# Patient Record
Sex: Male | Born: 1965
Health system: Southern US, Community
[De-identification: ages and names within clinical notes are randomized; demographics above are authoritative.]

## PROBLEM LIST (undated history)

## (undated) DIAGNOSIS — R600 Localized edema: Secondary | ICD-10-CM

## (undated) DIAGNOSIS — I340 Nonrheumatic mitral (valve) insufficiency: Secondary | ICD-10-CM

## (undated) DIAGNOSIS — F419 Anxiety disorder, unspecified: Secondary | ICD-10-CM

## (undated) DIAGNOSIS — I4892 Unspecified atrial flutter: Secondary | ICD-10-CM

## (undated) DIAGNOSIS — L409 Psoriasis, unspecified: Secondary | ICD-10-CM

## (undated) DIAGNOSIS — I10 Essential (primary) hypertension: Secondary | ICD-10-CM

## (undated) DIAGNOSIS — I4891 Unspecified atrial fibrillation: Principal | ICD-10-CM

## (undated) DIAGNOSIS — I2510 Atherosclerotic heart disease of native coronary artery without angina pectoris: Secondary | ICD-10-CM

## (undated) DIAGNOSIS — Z8679 Personal history of other diseases of the circulatory system: Secondary | ICD-10-CM

## (undated) DIAGNOSIS — Z9889 Other specified postprocedural states: Secondary | ICD-10-CM

## (undated) HISTORY — DX: Atherosclerotic heart disease of native coronary artery without angina pectoris: I25.10

## (undated) HISTORY — DX: Nonrheumatic mitral (valve) insufficiency: I34.0

## (undated) HISTORY — PX: HERNIA REPAIR: SHX51

## (undated) HISTORY — DX: Localized edema: R60.0

## (undated) HISTORY — PX: CARPAL TUNNEL RELEASE: SHX101

---

## 1998-01-10 ENCOUNTER — Ambulatory Visit (HOSPITAL_COMMUNITY): Admission: RE | Admit: 1998-01-10 | Discharge: 1998-01-10 | Payer: Self-pay | Admitting: Family Medicine

## 1998-03-11 ENCOUNTER — Ambulatory Visit (HOSPITAL_COMMUNITY): Admission: RE | Admit: 1998-03-11 | Discharge: 1998-03-11 | Payer: Self-pay | Admitting: Orthopedic Surgery

## 1999-05-26 ENCOUNTER — Encounter: Admission: RE | Admit: 1999-05-26 | Discharge: 1999-05-26 | Payer: Self-pay | Admitting: Rheumatology

## 1999-05-26 ENCOUNTER — Encounter: Payer: Self-pay | Admitting: Rheumatology

## 2000-11-10 ENCOUNTER — Ambulatory Visit (HOSPITAL_BASED_OUTPATIENT_CLINIC_OR_DEPARTMENT_OTHER): Admission: RE | Admit: 2000-11-10 | Discharge: 2000-11-10 | Payer: Self-pay | Admitting: General Surgery

## 2001-04-16 ENCOUNTER — Ambulatory Visit (HOSPITAL_COMMUNITY): Admission: RE | Admit: 2001-04-16 | Discharge: 2001-04-16 | Payer: Self-pay | Admitting: Family Medicine

## 2001-04-16 ENCOUNTER — Encounter: Payer: Self-pay | Admitting: Family Medicine

## 2003-05-21 ENCOUNTER — Ambulatory Visit (HOSPITAL_COMMUNITY): Admission: RE | Admit: 2003-05-21 | Discharge: 2003-05-21 | Payer: Self-pay | Admitting: Family Medicine

## 2005-01-21 ENCOUNTER — Emergency Department (HOSPITAL_COMMUNITY): Admission: EM | Admit: 2005-01-21 | Discharge: 2005-01-22 | Payer: Self-pay | Admitting: *Deleted

## 2007-05-09 ENCOUNTER — Encounter: Admission: RE | Admit: 2007-05-09 | Discharge: 2007-05-09 | Payer: Self-pay | Admitting: Orthopedic Surgery

## 2010-10-16 NOTE — Op Note (Signed)
[place]  Patient:    Evan [name], Evan [name]                       MRN: [id number] Proc. Date: 11/10/00 Adm. Date:  [id number] Attending:  Cherylynn Ridges                           Operative Report  PREOPERATIVE DIAGNOSIS:  Right inguinal hernia.  POSTOPERATIVE DIAGNOSIS:  Right inguinal hernia.  PROCEDURE:  Right inguinal herniorrhaphy with mesh.  SURGEON:  Dr. Lindie Spruce.  ASSISTANT:  None.  ANESTHESIA:  General with a laryngeal airway.  ESTIMATED BLOOD LOSS:  Less than 20 cc.  COMPLICATIONS: None.  CONDITION:  Stable.  SPECIMENS:  None.  INDICATION FOR OPERATION:  The patient is an otherwise healthy 45 year old male with a right inguinal hernia who comes in now for repair.  FINDINGS:  The patient had a small indirect hernia which is easy to repair and also some mild weakness of the floor.  OPERATION:  The patient was taken to the operating room, placed on table in supine position.  After an adequate, general laryngeal airway anesthetic was administered he was prepped and draped in the usual sterile manner exposing the right groin.  A 10 blade was used to make a right transverse curvilinear incision at the level of the superficial ring.  It was taken down through the subcutaneous down to the external oblique fascia and through Scarpas fascia.  A small subcutaneous vein was ligated with 3-0 Vicryl.  We opened the external oblique fascia along its fibers down through the superficial ring.  The ilioinguinal nerve could be seen coursing onto the anterior medial aspect of the spermatic cord.  It was mobilized along with the cord at the pubic tubercle where it was subsequently controlled with a Penrose drain.  We placed the spermatic cord up on a ______ bench so we were able to isolate out the indirect sac from end-to-end anterior medial aspect of the cord.  We doubly ligated at the base using an 0 Ethibond suture x 2.  Once this was done we  reinforced the floor with an overpiece of Marlex mesh measuring 4 x 2 cm in size attaching it to the conjoint muscle tendon anteromedially and the reflective portion of the inguinal ligament inferolaterally.  Once this was done irrigation with antibiotic solution was placed into the canal.  We reapproximated the external oblique fascia using a running 3-0 Vicryl suture.  Once this was done, the subcutaneous, Scarpas fascia, was reapproximated using 3 interrupted stitches of 3-0 Vicryl.  Then the skin was closed using a running subcuticular stitch of 4-0 Prolene, a pullout stitch that can be cut and then pulled out from either end.  Steri-Strips were applied to the wound.  All needle counts, sponge counts and instrument counts were correct.  A sterile dressing was applied. DD:  11/10/00 TD:  11/10/00 Job: [id number]

## 2012-04-07 ENCOUNTER — Other Ambulatory Visit: Payer: Self-pay | Admitting: Occupational Medicine

## 2012-04-07 ENCOUNTER — Ambulatory Visit: Payer: Self-pay

## 2012-04-07 DIAGNOSIS — M79641 Pain in right hand: Secondary | ICD-10-CM

## 2012-08-04 ENCOUNTER — Emergency Department (HOSPITAL_COMMUNITY)
Admission: EM | Admit: 2012-08-04 | Discharge: 2012-08-04 | Disposition: A | Discharge status: Home / Self Care | Payer: 59 | Attending: Emergency Medicine | Admitting: Emergency Medicine

## 2012-08-04 ENCOUNTER — Encounter (HOSPITAL_COMMUNITY): Payer: Self-pay | Admitting: *Deleted

## 2012-08-04 DIAGNOSIS — W268XXA Contact with other sharp object(s), not elsewhere classified, initial encounter: Secondary | ICD-10-CM | POA: Insufficient documentation

## 2012-08-04 DIAGNOSIS — Y929 Unspecified place or not applicable: Secondary | ICD-10-CM | POA: Insufficient documentation

## 2012-08-04 DIAGNOSIS — S91332A Puncture wound without foreign body, left foot, initial encounter: Secondary | ICD-10-CM

## 2012-08-04 DIAGNOSIS — Z23 Encounter for immunization: Secondary | ICD-10-CM | POA: Insufficient documentation

## 2012-08-04 DIAGNOSIS — S91309A Unspecified open wound, unspecified foot, initial encounter: Secondary | ICD-10-CM | POA: Insufficient documentation

## 2012-08-04 DIAGNOSIS — I10 Essential (primary) hypertension: Secondary | ICD-10-CM | POA: Insufficient documentation

## 2012-08-04 DIAGNOSIS — Y939 Activity, unspecified: Secondary | ICD-10-CM | POA: Insufficient documentation

## 2012-08-04 DIAGNOSIS — Z79899 Other long term (current) drug therapy: Secondary | ICD-10-CM | POA: Insufficient documentation

## 2012-08-04 HISTORY — DX: Essential (primary) hypertension: I10

## 2012-08-04 MED ORDER — TETANUS-DIPHTH-ACELL PERTUSSIS 5-2.5-18.5 LF-MCG/0.5 IM SUSP
0.5000 mL | Freq: Once | INTRAMUSCULAR | Status: AC
  Administered 2012-08-04: 0.5 mL via INTRAMUSCULAR
  Filled 2012-08-04: qty 0.5

## 2012-08-04 MED ORDER — CIPROFLOXACIN HCL 500 MG PO TABS: 500.0000 mg | ORAL_TABLET | Freq: Two times a day (BID) | ORAL | Status: DC

## 2012-08-04 NOTE — ED Provider Notes (Signed)
Medical screening examination/treatment/procedure(s) were performed by non-physician practitioner and as supervising physician I was immediately available for consultation/collaboration.   Stephen Rancour, MD 08/04/12 2208 

## 2012-08-04 NOTE — ED Provider Notes (Signed)
History    This chart was scribed for non-physician practitioner working with Glynn Octave, MD by Frederik Pear, ED Scribe. This patient was seen in room TR09C/TR09C and the patient's care was started at 2024.   CSN: [id number]  Arrival date & time 08/04/12  1926   First MD Initiated Contact with Patient 08/04/12 2024      Chief Complaint  Patient presents with  . Puncture Wound    (Consider location/radiation/quality/duration/timing/severity/associated sxs/prior treatment) The history is provided by the patient. No language interpreter was used.    Evan [name] is a 47 y.o. male who presents to the Emergency Department complaining of sudden onset, constant, moderate puncture wound to the bottom of his left foot after he stepped on a small rusty, paint-coated nail while wearing only his socks that was left in his carpet by maintenance. In ED, the bleeding is controlled. He reports that he pulled the nail out prior to arrival.  He also reports that the nail came out in its entirety and did not break.  He has a h/o of hypertension and anxiety for which he takes Benicar and Xanax. He reports that his last tetanus shot was 6-7 years ago. He denies any allergies to medications.    Past Medical History  Diagnosis Date  . Hypertension     History reviewed. No pertinent past surgical history.  No family history on file.  History  Substance Use Topics  . Smoking status: Never Smoker   . Smokeless tobacco: Not on file  . Alcohol Use: Yes      Review of Systems  Constitutional: Negative for fever, diaphoresis, appetite change, fatigue and unexpected weight change.  HENT: Negative for mouth sores and neck stiffness.   Eyes: Negative for visual disturbance.  Respiratory: Negative for cough, chest tightness, shortness of breath and wheezing.   Cardiovascular: Negative for chest pain.  Gastrointestinal: Negative for nausea, vomiting, abdominal pain, diarrhea and constipation.   Endocrine: Negative for polydipsia, polyphagia and polyuria.  Genitourinary: Negative for dysuria, urgency, frequency and hematuria.  Musculoskeletal: Negative for back pain.  Skin: Positive for wound. Negative for rash.  Allergic/Immunologic: Negative for immunocompromised state.  Neurological: Negative for syncope, light-headedness and headaches.  Hematological: Does not bruise/bleed easily.  Psychiatric/Behavioral: Negative for sleep disturbance. The patient is not nervous/anxious.   All other systems reviewed and are negative.    Allergies  Review of patient's allergies indicates no known allergies.  Home Medications   Current Outpatient Rx  Name  Route  Sig  Dispense  Refill  . ALPRAZolam (XANAX) 0.5 MG tablet   Oral   Take 0.5 mg by mouth 2 (two) times daily as needed for anxiety.         . Multiple Vitamin (MULTIVITAMIN WITH MINERALS) TABS   Oral   Take 1 tablet by mouth daily.         Marland Kitchen olmesartan (BENICAR) 20 MG tablet   Oral   Take 20 mg by mouth every morning.         . ciprofloxacin (CIPRO) 500 MG tablet   Oral   Take 1 tablet (500 mg total) by mouth every 12 (twelve) hours.   20 tablet   0     BP 151/97  Pulse 87  Temp(Src) 98.3 F (36.8 C) (Oral)  Resp 18  SpO2 96%  Physical Exam  Nursing note and vitals reviewed. Constitutional: He appears well-developed and well-nourished. No distress.  HENT:  Head: Normocephalic and atraumatic.  Mouth/Throat: Oropharynx  is clear and moist. No oropharyngeal exudate.  Eyes: Conjunctivae are normal. No scleral icterus.  Neck: Normal range of motion. Neck supple.  Cardiovascular: Normal rate, regular rhythm and intact distal pulses.   Pulmonary/Chest: Effort normal and breath sounds normal. No respiratory distress. He has no wheezes.  Abdominal: Soft. Bowel sounds are normal. He exhibits no mass. There is no tenderness. There is no rebound and no guarding.  Musculoskeletal: Normal range of motion. He  exhibits no edema.  Neurological: He is alert.  Speech is clear and goal oriented Moves extremities without ataxia  Skin: Skin is warm and dry. Ecchymosis and laceration noted. He is not diaphoretic.  There is a puncture wound to the bottom of the left foot with ecchymosis. Hemostasis achieved No palpable foreign body  Psychiatric: He has a normal mood and affect.    ED Course  Procedures (including critical care time)  DIAGNOSTIC STUDIES: Oxygen Saturation is 96% on room air, adequate by my interpretation.    COORDINATION OF CARE:  20:25- Discussed planned course of treatment with the patient, including a TDaP and an antibiotic, who is agreeable at this time.  20:36- Medication Orders- TDaP (boostrix) injection 0.5 mL- once  Labs Reviewed - No data to display No results found.   1. Puncture wound of foot, left, initial encounter       MDM  Evan [name] presents with a laceration to the bottom of his foot from rusty nail. Patient puncture wound explored and no foreign bodies palpated, wound is too small for suture. Patient tetanus updated and will be discharged home on antibiotics. Wound care discussed. I have also discussed reasons to return immediately to the ER.  Patient expresses understanding and agrees with plan.   1. Medications: cipro, usual home medications  2. Treatment: rest, drink plenty of fluids, take medication as prescribed  3. Follow Up: Please followup with your primary doctor for discussion of your diagnoses and further evaluation after today's visit; if you do not have a primary care doctor use the resource guide provided to find one;    I personally performed the services described in this documentation, which was scribed in my presence. The recorded information has been reviewed and is accurate.       Dahlia Client Muthersbaugh, PA-C 08/04/12 2110

## 2012-08-04 NOTE — ED Notes (Signed)
The pt stepped on a rusty nail just pta.  The nail was left in huis carpet by maintenance.  Puncture wound to the bottom of his left foot.  The nail went thorough his sock.  Needs a tetnaus

## 2014-01-09 ENCOUNTER — Encounter (HOSPITAL_COMMUNITY): Payer: Self-pay | Admitting: Emergency Medicine

## 2014-01-09 ENCOUNTER — Emergency Department (HOSPITAL_COMMUNITY)
Admission: EM | Admit: 2014-01-09 | Discharge: 2014-01-09 | Disposition: A | Discharge status: Home / Self Care | Payer: 59 | Attending: Emergency Medicine | Admitting: Emergency Medicine

## 2014-01-09 ENCOUNTER — Emergency Department (HOSPITAL_COMMUNITY): Payer: 59

## 2014-01-09 DIAGNOSIS — F411 Generalized anxiety disorder: Secondary | ICD-10-CM | POA: Insufficient documentation

## 2014-01-09 DIAGNOSIS — Z87828 Personal history of other (healed) physical injury and trauma: Secondary | ICD-10-CM | POA: Insufficient documentation

## 2014-01-09 DIAGNOSIS — Z79899 Other long term (current) drug therapy: Secondary | ICD-10-CM | POA: Insufficient documentation

## 2014-01-09 DIAGNOSIS — Z872 Personal history of diseases of the skin and subcutaneous tissue: Secondary | ICD-10-CM | POA: Diagnosis not present

## 2014-01-09 DIAGNOSIS — I10 Essential (primary) hypertension: Secondary | ICD-10-CM | POA: Diagnosis not present

## 2014-01-09 DIAGNOSIS — Z7982 Long term (current) use of aspirin: Secondary | ICD-10-CM | POA: Diagnosis not present

## 2014-01-09 DIAGNOSIS — Z87891 Personal history of nicotine dependence: Secondary | ICD-10-CM | POA: Diagnosis not present

## 2014-01-09 DIAGNOSIS — I4891 Unspecified atrial fibrillation: Secondary | ICD-10-CM | POA: Diagnosis not present

## 2014-01-09 DIAGNOSIS — R079 Chest pain, unspecified: Secondary | ICD-10-CM | POA: Insufficient documentation

## 2014-01-09 HISTORY — DX: Psoriasis, unspecified: L40.9

## 2014-01-09 HISTORY — DX: Anxiety disorder, unspecified: F41.9

## 2014-01-09 LAB — CBC
HEMATOCRIT: 41.1 % (ref 39.0–52.0)
Hemoglobin: 14 g/dL (ref 13.0–17.0)
MCH: 35.2 pg — ABNORMAL HIGH (ref 26.0–34.0)
MCHC: 34.1 g/dL (ref 30.0–36.0)
MCV: 103.3 fL — ABNORMAL HIGH (ref 78.0–100.0)
PLATELETS: 204 10*3/uL (ref 150–400)
RBC: 3.98 MIL/uL — ABNORMAL LOW (ref 4.22–5.81)
RDW: 13.1 % (ref 11.5–15.5)
WBC: 5.5 10*3/uL (ref 4.0–10.5)

## 2014-01-09 LAB — COMPREHENSIVE METABOLIC PANEL
ALK PHOS: 83 U/L (ref 39–117)
ALT: 34 U/L (ref 0–53)
AST: 49 U/L — ABNORMAL HIGH (ref 0–37)
Albumin: 3.6 g/dL (ref 3.5–5.2)
Anion gap: 15 (ref 5–15)
BUN: 13 mg/dL (ref 6–23)
CO2: 25 mEq/L (ref 19–32)
CREATININE: 1.04 mg/dL (ref 0.50–1.35)
Calcium: 8.7 mg/dL (ref 8.4–10.5)
Chloride: 104 mEq/L (ref 96–112)
GFR calc Af Amer: >90 mL/min (ref >90)
GFR calc non Af Amer: 82 mL/min — ABNORMAL LOW (ref >90)
Glucose, Bld: 92 mg/dL (ref 70–99)
POTASSIUM: 4 meq/L (ref 3.7–5.3)
Sodium: 144 mEq/L (ref 137–147)
TOTAL PROTEIN: 7.1 g/dL (ref 6.0–8.3)
Total Bilirubin: 0.5 mg/dL (ref 0.3–1.2)

## 2014-01-09 LAB — TROPONIN I

## 2014-01-09 MED ORDER — METOPROLOL TARTRATE 12.5 MG HALF TABLET: 100.0000 mg | ORAL_TABLET | Freq: Two times a day (BID) | ORAL | Status: DC

## 2014-01-09 NOTE — Discharge Instructions (Signed)

## 2014-01-09 NOTE — ED Provider Notes (Signed)
CSN: [id number]     Arrival date & time 01/09/14  1811 History   First MD Initiated Contact with Patient 01/09/14 1838     Chief Complaint  Patient presents with  . Atrial Fibrillation      HPI Pt arrived by [facility] from [facility] with c/o afib RVR. Initially pt had a rate of 144 in afib and when they arrived to ED pt rate 76 NSR, when getting pt to room rate 42 bradycardia with PJCs and peaked T waves. Pt stated that he fell earlier today, denies LOC, stated that he tripped and landed on asphalt. Stated that he has been having some right sided CP that started after he fell that comes and goes and pain increases on palpation.   Past Medical History  Diagnosis Date  . Hypertension   . Anxiety   . Psoriasis    Past Surgical History  Procedure Laterality Date  . Hernia repair    . Carpal tunnel release     No family history on file. History  Substance Use Topics  . Smoking status: Former Smoker    Types: Cigarettes  . Smokeless tobacco: Not on file  . Alcohol Use: Yes     Comment: wine at night to help relax    Review of Systems  All other systems reviewed and are negative  Allergies  Review of patient's allergies indicates no known allergies.  Home Medications   Prior to Admission medications   Medication Sig Start Date End Date Taking? Authorizing Provider  aspirin EC 81 MG tablet Take 81 mg by mouth daily.   Yes Historical Provider, MD  citalopram (CELEXA) 20 MG tablet Take 20 mg by mouth daily. 10/08/13  Yes Historical Provider, MD  Cyanocobalamin (B-12 PO) Take 1 capsule by mouth daily.   Yes Historical Provider, MD  LORazepam (ATIVAN) 0.5 MG tablet Take 0.5 mg by mouth every 8 (eight) hours as needed for anxiety.   Yes Historical Provider, MD  Multiple Vitamin (MULTIVITAMIN WITH MINERALS) TABS Take 1 tablet by mouth daily.   Yes Historical Provider, MD  olmesartan (BENICAR) 40 MG tablet Take 40 mg by mouth daily.   Yes Historical Provider, MD   metoprolol (LOPRESSOR) 12.5 mg TABS tablet Take 4 tablets (100 mg total) by mouth 2 (two) times daily. 01/09/14   Dot Lanes, MD   BP 118/80  Pulse 42  Temp(Src) 98.2 F (36.8 C) (Oral)  Resp 16  Ht 6\' 2"  (1.88 m)  Wt 205 lb (92.987 kg)  BMI 26.31 kg/m2  SpO2 99% Physical Exam Physical Exam  Nursing note and vitals reviewed. Constitutional: He is oriented to person, place, and time. He appears well-developed and well-nourished. No distress.  HENT:  Head: Normocephalic and atraumatic.  Eyes: Pupils are equal, round, and reactive to light.  Neck: Normal range of motion.  Cardiovascular: Normal rate and intact distal pulses.   no significant murmurs or gallops. Pulmonary/Chest: No respiratory distress.  Breath sounds equal  Abdominal: Normal appearance. He exhibits no distension.  Musculoskeletal: Normal range of motion.  Neurological: He is alert and oriented to person, place, and time. No cranial nerve deficit.  Skin: Skin is warm and dry. No rash noted.  Psychiatric: He has a normal mood and affect. His behavior is normal.   ED Course  Procedures (including critical care time) Labs Review Labs Reviewed  CBC - Abnormal; Notable for the following:    RBC 3.98 (*)    MCV 103.3 (*)  MCH 35.2 (*)    All other components within normal limits  COMPREHENSIVE METABOLIC PANEL - Abnormal; Notable for the following:    AST 49 (*)    GFR calc non Af Amer 82 (*)    All other components within normal limits  TROPONIN I    Imaging Review Dg Chest 2 View  01/09/2014   CLINICAL DATA:  Atrial fibrillation.  Right chest pain.  EXAM: CHEST  2 VIEW  COMPARISON:  01/22/2005  FINDINGS: The heart size and mediastinal contours are within normal limits. Both lungs are clear. The visualized skeletal structures are unremarkable.  IMPRESSION: No active cardiopulmonary disease.   Electronically Signed   By: Lajean Manes M.D.   On: 01/09/2014 20:52     EKG Interpretation   Date/Time:   Wednesday January 09 2014 18:44:19 EDT Ventricular Rate:  42 PR Interval:  167 QRS Duration: 98 QT Interval:  446 QTC Calculation: 373 R Axis:   55 Text Interpretation:  Sinus bradycardia Abnormal ekg Confirmed by Madalina Rosman   MD, Xochil Shanker ([id number]) on 01/09/2014 8:27:22 PM     Review of EKG taken at outlying center showed atrial fibrillation with rapid ventricular response rate of 144.  Patient was seen and evaluated by cardiology.  Patient stable for discharge and will followup as outpatient with directions. MDM   Final diagnoses:  Atrial fibrillation, unspecified        Dot Lanes, MD 01/09/14 2204

## 2014-01-09 NOTE — Consult Note (Addendum)
Admit date: 01/09/2014 Referring Physician  Dr. Audie Pinto Primary Physician No primary provider on file. Primary Cardiologist  n/a Reason for Consultation  AF  ID: [name] is a 48 y.o caucasian male with psoriasis, anxiety, alcohol abuse, tobacco use who presents as a direct transfer from urgent care center with AF.   HPI: Pt presented to the urgent care clinic with right sided chest pain. This morning pt had mechanical fall as he slipped on gravel. Since then he has had right sided chest pain. While at urgent care, pt was noted to be AF with RVR. No palpitations at that time. Pt has had palpitations previously, which were sudden onset, resolved with anxiolytic and rest. Pt has anxiety and takes ativan PRN. Pt denies left sided chest pain, dyspnea, nausea, vomiting. He does have chronic diarrhea. He also complaints of episodes of hot flashes which come on after he showers. Pt drinks 4 glasses of wine daily. Smokes tobacco 1-2 cigs per month.   Pt spontaneously converted to NSR while in the ED. Initial ECG here with sinus bradycardia and PACs. At the time of my evaluate, pt in NSR with HR of 75.     PMH:   Past Medical History  Diagnosis Date  . Hypertension   . Anxiety   . Psoriasis     PSH:   Past Surgical History  Procedure Laterality Date  . Hernia repair    . Carpal tunnel release     Allergies:  Review of patient's allergies indicates no known allergies. Prior to Admit Meds:   Prior to Admission medications   Medication Sig Start Date End Date Taking? Authorizing Provider  aspirin EC 81 MG tablet Take 81 mg by mouth daily.   Yes Historical Provider, MD  citalopram (CELEXA) 20 MG tablet Take 20 mg by mouth daily. 10/08/13  Yes Historical Provider, MD  Cyanocobalamin (B-12 PO) Take 1 capsule by mouth daily.   Yes Historical Provider, MD  LORazepam (ATIVAN) 0.5 MG tablet Take 0.5 mg by mouth every 8 (eight) hours as needed for anxiety.   Yes Historical Provider, MD  Multiple  Vitamin (MULTIVITAMIN WITH MINERALS) TABS Take 1 tablet by mouth daily.   Yes Historical Provider, MD  olmesartan (BENICAR) 40 MG tablet Take 40 mg by mouth daily.   Yes Historical Provider, MD  metoprolol (LOPRESSOR) 12.5 mg TABS tablet Take 4 tablets (100 mg total) by mouth 2 (two) times daily. 01/09/14   Dot Lanes, MD   Fam HX:   Brother with MI at age 64. Mom died of MI in her 93s.   Social HX:   Lives in [place]. Works for [employer]  History   Social History  . Marital Status: Married    Spouse Name: [name]    Number of Children: [name]  . Years of Education: [name]   Occupational History  . Not on file.   Social History Main Topics  . Smoking status: Former Smoker    Types: Cigarettes  . Smokeless tobacco: Not on file  . Alcohol Use: Yes     Comment: wine at night to help relax  . Drug Use: No  . Sexual Activity: Not on file   Other Topics Concern  . Not on file   Social History Narrative  . No narrative on file     ROS:  All 11 ROS were addressed and are negative except what is stated in the HPI   Physical Exam: Blood pressure 115/77, pulse  43, temperature 98.2 F (36.8 C), temperature source Oral, resp. rate 18, height 6\' 2"  (1.88 m), weight 92.987 kg (205 lb), SpO2 98.00%.   General: Well developed, well nourished, in no acute distress. Multiple tattoos noted.  Head: Eyes PERRLA, No xanthomas.   Normal cephalic and atramatic  Lungs:   Clear bilaterally to auscultation and percussion. Normal respiratory effort. No wheezes, no rales. Heart:   HRRR S1 S2 Pulses are 2+ & equal. No murmur, rubs, gallops.  No carotid bruit. No JVD.  No abdominal bruits.  Abdomen: Bowel sounds are positive, abdomen soft and non-tender without masses. No hepatosplenomegaly. Msk:  Back normal. Normal strength and tone for age. Extremities:  No clubbing, cyanosis or edema.  DP +1 Neuro: Alert and oriented X 3, non-focal, MAE x 4 GU: Deferred Rectal: Deferred Psych:  Good  affect, responds appropriately      Labs: Lab Results  Component Value Date   WBC 5.5 01/09/2014   HGB 14.0 01/09/2014   HCT 41.1 01/09/2014   MCV 103.3* 01/09/2014   PLT 204 01/09/2014     Recent Labs Lab 01/09/14 1815  NA 144  K 4.0  CL 104  CO2 25  BUN 13  CREATININE 1.04  CALCIUM 8.7  PROT 7.1  BILITOT 0.5  ALKPHOS 83  ALT 34  AST 49*  GLUCOSE 92    Recent Labs  01/09/14 1815  TROPONINI <0.30   No results found for this basename: CHOL, HDL, LDLCALC, TRIG   No results found for this basename: DDIMER     Radiology:  Dg Chest 2 View  01/09/2014   CLINICAL DATA:  Atrial fibrillation.  Right chest pain.  EXAM: CHEST  2 VIEW  COMPARISON:  01/22/2005  FINDINGS: The heart size and mediastinal contours are within normal limits. Both lungs are clear. The visualized skeletal structures are unremarkable.  IMPRESSION: No active cardiopulmonary disease.   Electronically Signed   By: Lajean Manes M.D.   On: 01/09/2014 20:52   Personally viewed.  EKG:  AF at urgent care. Sinus brady here. Tele with NSR with HR of 75.   ASSESSMENT - pAF, now in NSR - Musculoskeletal right sided chest pain - Alcoholism  PLAN:   - start on lopressor 12.5mg  po BID. His HR and BP should tolerate - continue home benicar, celexa, ativan, ASA 81mg  - we will schedule him a follow up with cardiology outpatient.  - pt feels well and agrees with discharge home from the emergency department. I have advised to return to ED for any concerning or new symptoms.   - abstain from alcohol use.   Thank you for this consult.   Orson Gear, MD  01/09/2014  10:59 PM

## 2014-01-09 NOTE — ED Notes (Signed)
Admission MD at bedside.  

## 2014-01-09 NOTE — ED Notes (Signed)
Pt arrived by [facility] from [facility] with c/o afib RVR. Initially pt had a rate of 144 in afib and when they arrived to ED pt rate 76 NSR, when getting pt to room rate 42 bradycardia with PJCs and peaked T waves. Pt stated that he fell earlier today, denies LOC, stated that he tripped and landed on asphalt. Stated that he has been having some right sided CP that started after he fell that comes and goes and pain increases on palpation.

## 2014-01-10 NOTE — Progress Notes (Signed)
CM received a phone call from [facility], [facility] pharmacist, and she requested clarification on the patient discharge prescription. This CM spoke with Lorre Munroe PA and he accessed the ED consult note from cardiology and stated that per the cardiology note the patient was to be started on Lopressor 12.5mg  po BID. This CM called Kelly at [phone] and clarified the medication order per the cardiology note. Claiborne Billings read back the clarification. No other questions or concerns. Edwyna Shell, RN, BSN, Case Manager 01/10/2014 11:34 AM

## 2014-01-24 ENCOUNTER — Encounter: Payer: Self-pay | Admitting: *Deleted

## 2014-01-24 ENCOUNTER — Other Ambulatory Visit: Payer: Self-pay | Admitting: *Deleted

## 2014-01-24 ENCOUNTER — Ambulatory Visit (INDEPENDENT_AMBULATORY_CARE_PROVIDER_SITE_OTHER): Payer: 59 | Admitting: Cardiology

## 2014-01-24 VITALS — BP 116/74 | HR 65 | Ht 74.0 in | Wt 203.0 lb

## 2014-01-24 DIAGNOSIS — F1010 Alcohol abuse, uncomplicated: Secondary | ICD-10-CM

## 2014-01-24 DIAGNOSIS — R0609 Other forms of dyspnea: Secondary | ICD-10-CM

## 2014-01-24 DIAGNOSIS — I10 Essential (primary) hypertension: Secondary | ICD-10-CM

## 2014-01-24 DIAGNOSIS — R0989 Other specified symptoms and signs involving the circulatory and respiratory systems: Secondary | ICD-10-CM

## 2014-01-24 DIAGNOSIS — R0600 Dyspnea, unspecified: Secondary | ICD-10-CM

## 2014-01-24 DIAGNOSIS — Z79899 Other long term (current) drug therapy: Secondary | ICD-10-CM

## 2014-01-24 DIAGNOSIS — I4891 Unspecified atrial fibrillation: Secondary | ICD-10-CM

## 2014-01-24 LAB — TSH: TSH: 0.75 u[IU]/mL (ref 0.35–4.50)

## 2014-01-24 LAB — APTT: aPTT: 30.9 s (ref 23.4–32.7)

## 2014-01-24 LAB — PROTIME-INR
INR: 1 ratio (ref 0.8–1.0)
Prothrombin Time: 11.1 s (ref 9.6–13.1)

## 2014-01-24 LAB — CALCIUM: Calcium: 8.9 mg/dL (ref 8.4–10.5)

## 2014-01-24 LAB — MAGNESIUM: Magnesium: 1.9 mg/dL (ref 1.5–2.5)

## 2014-01-24 NOTE — Patient Instructions (Signed)
Your physician recommends that you continue on your current medications as directed. Please refer to the Current Medication list given to you today.  Your physician recommends that you return for lab work in: TODAY (TSH, CALCIUM, MAGNESIUM, PT/PTT)  Your physician has recommended that you wear an event monitor. Event monitors are medical devices that record the heart's electrical activity. Doctors most often [place] these monitors to diagnose arrhythmias. Arrhythmias are problems with the speed or rhythm of the heartbeat. The monitor is a small, portable device. You can wear one while you do your normal daily activities. This is usually used to diagnose what is causing palpitations/syncope (passing out).  DR Meda Coffee HAS ORDERED FOR YOU TO WEAR THIS EVENT MONITOR FOR ONE WEEK ONLY  Your physician has requested that you have an echocardiogram. Echocardiography is a painless test that uses sound waves to create images of your heart. It provides your doctor with information about the size and shape of your heart and how well your heart's chambers and valves are working. This procedure takes approximately one hour. There are no restrictions for this procedure.  Your physician has requested that you have en exercise stress myoview. For further information please visit [email or web address]. Please follow instruction sheet, as given.  Your physician recommends that you schedule a follow-up appointment in: WITH DR Meda Coffee AFTER ALL YOUR TEST ARE COMPLETED AND RESULTED

## 2014-01-24 NOTE — Progress Notes (Signed)
Patient ID: Algis Liming, male   DOB: Nov 08, 1965, 48 y.o.   MRN: [id number]     Patient Name: Evan [name] Date of Encounter: 01/24/2014  Primary Care Provider:  No primary provider on file. Primary Cardiologist:  Dorothy Spark   Problem List   Past Medical History  Diagnosis Date  . Hypertension   . Anxiety   . Psoriasis    Past Surgical History  Procedure Laterality Date  . Hernia repair Right     with mesh  . Carpal tunnel release      Allergies  No Known Allergies  HPI  Evan [name] is a 48 y.o caucasian male with psoriasis, anxiety, alcohol abuse, tobacco use who presented as a direct transfer from urgent care center with AF.  Pt presented to the urgent care clinic with right sided chest pain. That morning pt had mechanical fall as he slipped on gravel. Since then he has had right sided chest pain. While at urgent care, pt was noted to be AF with RVR. No palpitations at that time. Pt has had palpitations previously, which were sudden onset, resolved with anxiolytic and rest. Pt has anxiety and takes ativan PRN. Pt denied left sided chest pain, dyspnea, nausea, vomiting. He does have chronic diarrhea. He also complaints of episodes of hot flashes which come on after he showers. Pt drinks 4 glasses of wine daily. Smokes tobacco 1-2 cigs per month.  Pt spontaneously converted to NSR while in the ED. Initial ECG here with sinus bradycardia and PACs. At the time of my evaluate, pt in NSR with HR of 75.  The patient was started on metoprolol. He has another episode of irregular rhythm with RVR yesterday. He is not sure how long it lasted. He didn't feel any palpitations or dizziness.  FH of premature CAD - brother MI at age 50.    Home Medications  Prior to Admission medications   Medication Sig Start Date End Date Taking? Authorizing Provider  aspirin EC 81 MG tablet Take 81 mg by mouth daily.   Yes Historical Provider, MD  citalopram (CELEXA) 20 MG tablet Take 20 mg  by mouth daily. 10/08/13  Yes Historical Provider, MD  Cyanocobalamin (B-12 PO) Take 500 mg by mouth daily.    Yes Historical Provider, MD  LORazepam (ATIVAN) 0.5 MG tablet Take 0.5 mg by mouth every 8 (eight) hours as needed for anxiety.   Yes Historical Provider, MD  metoprolol tartrate (LOPRESSOR) 12.5 mg TABS tablet Take 12.5 mg by mouth 2 (two) times daily. 01/09/14  Yes Dot Lanes, MD  Multiple Vitamin (MULTIVITAMIN WITH MINERALS) TABS Take 1 tablet by mouth daily.   Yes Historical Provider, MD  olmesartan (BENICAR) 40 MG tablet Take 40 mg by mouth daily.   Yes Historical Provider, MD    Family History  No family history on file.  Social History  History   Social History  . Marital Status: Married    Spouse Name: [name]    Number of Children: [name]  . Years of Education: [name]   Occupational History  . Not on file.   Social History Main Topics  . Smoking status: Former Smoker    Types: Cigarettes, Cigars  . Smokeless tobacco: Not on file  . Alcohol Use: Yes     Comment: wine at night to help relax  . Drug Use: No  . Sexual Activity: Not on file   Other Topics Concern  . Not on file   Social  History Narrative  . No narrative on file     Review of Systems, as per HPI, otherwise negative General:  No chills, fever, night sweats or weight changes.  Cardiovascular:  No chest pain, dyspnea on exertion, edema, orthopnea, palpitations, paroxysmal nocturnal dyspnea. Dermatological: No rash, lesions/masses Respiratory: No cough, dyspnea Urologic: No hematuria, dysuria Abdominal:   No nausea, vomiting, diarrhea, bright red blood per rectum, melena, or hematemesis Neurologic:  No visual changes, wkns, changes in mental status. All other systems reviewed and are otherwise negative except as noted above.  Physical Exam  Blood pressure 116/74, pulse 65, height 6\' 2"  (1.88 m), weight 203 lb (92.08 kg), SpO2 96.00%.  General: Pleasant, NAD Psych: Normal affect. Neuro: Alert  and oriented X 3. Moves all extremities spontaneously. HEENT: Normal  Neck: Supple without bruits or JVD. Lungs:  Resp regular and unlabored, CTA. Heart: RRR no s3, s4, or murmurs. Abdomen: Soft, non-tender, non-distended, BS + x 4.  Extremities: No clubbing, cyanosis or edema. DP/PT/Radials 2+ and equal bilaterally.  Labs:  No results found for this basename: CKTOTAL, CKMB, TROPONINI,  in the last 72 hours Lab Results  Component Value Date   WBC 5.5 01/09/2014   HGB 14.0 01/09/2014   HCT 41.1 01/09/2014   MCV 103.3* 01/09/2014   PLT 204 01/09/2014    No results found for this basename: DDIMER   No components found with this basename: POCBNP,     Component Value Date/Time   NA 144 01/09/2014 1815   K 4.0 01/09/2014 1815   CL 104 01/09/2014 1815   CO2 25 01/09/2014 1815   GLUCOSE 92 01/09/2014 1815   BUN 13 01/09/2014 1815   CREATININE 1.04 01/09/2014 1815   CALCIUM 8.7 01/09/2014 1815   PROT 7.1 01/09/2014 1815   ALBUMIN 3.6 01/09/2014 1815   AST 49* 01/09/2014 1815   ALT 34 01/09/2014 1815   ALKPHOS 83 01/09/2014 1815   BILITOT 0.5 01/09/2014 1815   GFRNONAA 82* 01/09/2014 1815   GFRAA >90 01/09/2014 1815   No results found for this basename: CHOL, HDL, LDLCALC, TRIG    Accessory Clinical Findings  Echocardiogram - normal  ECG - Sinus bradycardia, early repolarization, otherwise normal    Assessment & Plan  1.  New dg of paroxysmal a-fib with RVR -  We will order echo to evaluate for LVEF, LA size. Order nuclear stress test to evaluate for ischemia, if negative start Flecainide. We will also order 1 week e cardio monitor to evaluate for a-fib burden and rate.  For now continue ASA as we dont have any labs, also he states that he bleeds a lot when he gets injured. Chcek CBC, PT, INR.  2. Alcohol abuse - advised to cut down or ideally stop as it is possible a-fib trigger. He refuses as "he won't fall asleep without it".   3. Hypertension - controlled  Labs today, follow in 1  month.    Dorothy Spark, MD, [facility] 01/24/2014, 3:07 PM

## 2014-01-25 DIAGNOSIS — F1010 Alcohol abuse, uncomplicated: Secondary | ICD-10-CM | POA: Insufficient documentation

## 2014-01-25 DIAGNOSIS — I10 Essential (primary) hypertension: Secondary | ICD-10-CM | POA: Insufficient documentation

## 2014-01-25 DIAGNOSIS — R0600 Dyspnea, unspecified: Secondary | ICD-10-CM | POA: Insufficient documentation

## 2014-01-25 DIAGNOSIS — R0609 Other forms of dyspnea: Secondary | ICD-10-CM | POA: Insufficient documentation

## 2014-01-25 DIAGNOSIS — I4891 Unspecified atrial fibrillation: Secondary | ICD-10-CM | POA: Insufficient documentation

## 2014-01-30 ENCOUNTER — Encounter: Payer: Self-pay | Admitting: *Deleted

## 2014-01-30 ENCOUNTER — Encounter (INDEPENDENT_AMBULATORY_CARE_PROVIDER_SITE_OTHER): Payer: 59

## 2014-01-30 DIAGNOSIS — I4891 Unspecified atrial fibrillation: Secondary | ICD-10-CM

## 2014-01-30 DIAGNOSIS — Z79899 Other long term (current) drug therapy: Secondary | ICD-10-CM

## 2014-01-30 NOTE — Progress Notes (Signed)
Patient ID: Evan [name], male   DOB: [birth year], 48 y.o.   MRN: [id number] [employer] 7 day cardiac event monitor applied to patient.

## 2014-02-01 ENCOUNTER — Telehealth: Payer: Self-pay | Admitting: *Deleted

## 2014-02-01 MED ORDER — METOPROLOL TARTRATE 12.5 MG HALF TABLET: ORAL_TABLET | ORAL | Status: DC

## 2014-02-01 NOTE — Telephone Encounter (Signed)
Received call from Evan [name] that he had recording this AM showing HR 120-140.  Evan [name] (Building control surveyor) printed recordings.  Showed HR 74-170.  Called pt.  He states when he got up this AM could feel HR being fast. He took 25 mg Metoprolol.  States is not light headed or dizzy.  He is scheduled for Echo and Nuclear stress test on 9/9.  Spoke w/Dr. Meda Coffee.  She wants to wait since pt asymptomatic until results of Echo and stress test have been reviewed.  She states for him to try to abstain from alcohol.  He may take extra dose of Metoprolol when HR elevated. Notified pt that he can take Metoprolol 25 mg when HR is elevated.  Also to try to avoid alcohol since can not start on anticoagulant and risk for stroke is elevated.  He states he will stop the alcohol. Will send refill in for Metoprolol

## 2014-02-06 ENCOUNTER — Ambulatory Visit (HOSPITAL_COMMUNITY): Payer: 59 | Attending: Cardiology | Admitting: Cardiology

## 2014-02-06 ENCOUNTER — Ambulatory Visit (HOSPITAL_BASED_OUTPATIENT_CLINIC_OR_DEPARTMENT_OTHER): Payer: 59 | Admitting: Radiology

## 2014-02-06 VITALS — BP 126/80 | Ht 74.0 in | Wt 200.0 lb

## 2014-02-06 DIAGNOSIS — Z87891 Personal history of nicotine dependence: Secondary | ICD-10-CM | POA: Insufficient documentation

## 2014-02-06 DIAGNOSIS — I10 Essential (primary) hypertension: Secondary | ICD-10-CM | POA: Diagnosis not present

## 2014-02-06 DIAGNOSIS — R079 Chest pain, unspecified: Secondary | ICD-10-CM

## 2014-02-06 DIAGNOSIS — R0602 Shortness of breath: Secondary | ICD-10-CM

## 2014-02-06 DIAGNOSIS — I079 Rheumatic tricuspid valve disease, unspecified: Secondary | ICD-10-CM | POA: Insufficient documentation

## 2014-02-06 DIAGNOSIS — Z79899 Other long term (current) drug therapy: Secondary | ICD-10-CM

## 2014-02-06 DIAGNOSIS — I4891 Unspecified atrial fibrillation: Secondary | ICD-10-CM | POA: Diagnosis present

## 2014-02-06 MED ORDER — REGADENOSON 0.4 MG/5ML IV SOLN
0.4000 mg | Freq: Once | INTRAVENOUS | Status: AC
  Administered 2014-02-06: 0.4 mg via INTRAVENOUS

## 2014-02-06 MED ORDER — TECHNETIUM TC 99M SESTAMIBI GENERIC - CARDIOLITE
11.0000 | Freq: Once | INTRAVENOUS | Status: AC | PRN
Start: 2014-02-06 — End: 2014-02-06
  Administered 2014-02-06: 11 via INTRAVENOUS

## 2014-02-06 MED ORDER — TECHNETIUM TC 99M SESTAMIBI GENERIC - CARDIOLITE
33.0000 | Freq: Once | INTRAVENOUS | Status: AC | PRN
  Administered 2014-02-06: 33 via INTRAVENOUS

## 2014-02-06 NOTE — Progress Notes (Signed)
Evan [name] [address] [place], [place] [address] [phone]    Cardiology Nuclear Med Study  Evan [name] is a 48 y.o. male     MRN : [id number]     DOB: Apr 24, 1966  Procedure Date: 02/06/2014  Nuclear Med Background Indication for Stress Test:  Evaluation for Ischemia and [facility] 8/15 CP History:  AFIB-PAF Cardiac Risk Factors: Hypertension  Symptoms:  Chest Pain, Palpitations and SOB   Nuclear Pre-Procedure Caffeine/Decaff Intake:  None NPO After: 11:00pm   Lungs:  clear O2 Sat: 99% on room air. IV 0.9% NS with Angio Cath:  22g  IV Site: R Hand  IV Started by:  Crissie Figures, RN  Chest Size (in):  46 Cup Size: n/a  Height: 6\' 2"  (1.88 m)  Weight:  200 lb (90.719 kg)  BMI:  Body mass index is 25.67 kg/(m^2). Tech Comments:  Pt changed to a sitting  Lexiscan 2nd to AFIB-RVR    Nuclear Med Study 1 or 2 day study: 1 day  Stress Test Type:  Stress  Reading MD: [name]  Order Authorizing Provider:  Ena Dawley, MD  Resting Radionuclide: Technetium 67m Sestamibi  Resting Radionuclide Dose: 11.0 mCi   Stress Radionuclide:  Technetium 61m Sestamibi  Stress Radionuclide Dose: 33.0 mCi           Stress Protocol Rest HR: 122 Stress HR: 136  Rest BP: 126/80 Stress BP: 132/88  Exercise Time (min): n/a METS: n/a   Predicted Max HR: 170 bpm % Max HR: 80 bpm Rate Pressure Product: 17952   Dose of Adenosine (mg):  n/a Dose of Lexiscan: 0.4 mg  Dose of Atropine (mg): n/a Dose of Dobutamine: n/a mcg/kg/min (at max HR)  Stress Test Technologist: Perrin Maltese, EMT-P  Nuclear Technologist:  Annye Rusk, CNMT     Rest Procedure:  Myocardial perfusion imaging was performed at rest 45 minutes following the intravenous administration of Technetium 37m Sestamibi. Rest ECG: Atrial Fibrilliation  Stress Procedure:  The patient received IV Lexiscan 0.4 mg over 15-seconds.  Technetium 67m Sestamibi injected at 30-seconds. This patient was lt.  Headed and had sob with the Lexiscan injection. Quantitative spect images were obtained after a 45 minute delay. Stress ECG: No significant change from baseline ECG  QPS Raw Data Images:  Normal; no motion artifact; normal heart/lung ratio. Stress Images:  There is decreased uptake in the inferior wall. The defect is mild and involves only the basal inferoseptal segment (SSS 5, SS% 7%) Rest Images:  Normal homogeneous uptake in all areas of the myocardium. Subtraction (SDS):  These findings are consistent with ischemia. Transient Ischemic Dilatation (Normal <1.22):  1.59 Lung/Heart Ratio (Normal <0.45):  0.31  Quantitative Gated Spect Images QGS EDV:  150 ml QGS ESV:  80 ml  Impression Exercise Capacity:  Lexiscan with no exercise. BP Response:  Normal blood pressure response. Clinical Symptoms:  No significant symptoms noted. ECG Impression:  No significant ECG changes with Lexiscan. Comparison with Prior Nuclear Study: No previous nuclear study performed  Overall Impression:  Low risk stress nuclear study with a very small area of basal inferoseptal mild ischemia..  LV Ejection Fraction: 47%.  LV Wall Motion:  mild inferior hypokinesis and mildly depressed overall systolic function  Sanda Klein, MD, [facility] [phone] office [phone] pager

## 2014-02-06 NOTE — Progress Notes (Signed)
Echo performed. 

## 2014-02-07 ENCOUNTER — Telehealth: Payer: Self-pay | Admitting: Cardiology

## 2014-02-07 NOTE — Telephone Encounter (Signed)
Patient is returning your call. Please call back.  °

## 2014-02-07 NOTE — Telephone Encounter (Signed)
LMTCB

## 2014-02-07 NOTE — Telephone Encounter (Signed)
Notified pt of mildly abnormal stress test, and no need for cath per Dr Meda Coffee.  Informed the pt that per Dr Meda Coffee, she will discuss in detail at his next OV on 02/21/14 with her.  Pt verbalized understanding and agrees with this plan

## 2014-02-08 ENCOUNTER — Encounter: Payer: Self-pay | Admitting: Cardiology

## 2014-02-16 ENCOUNTER — Encounter (HOSPITAL_COMMUNITY): Payer: Self-pay | Admitting: Emergency Medicine

## 2014-02-16 ENCOUNTER — Inpatient Hospital Stay (HOSPITAL_COMMUNITY)
Admission: EM | Admit: 2014-02-16 | Discharge: 2014-02-18 | DRG: 310 | Disposition: A | Discharge status: Home / Self Care | Payer: 59 | Attending: Internal Medicine | Admitting: Internal Medicine

## 2014-02-16 DIAGNOSIS — I4891 Unspecified atrial fibrillation: Secondary | ICD-10-CM | POA: Diagnosis present

## 2014-02-16 DIAGNOSIS — F3289 Other specified depressive episodes: Secondary | ICD-10-CM | POA: Diagnosis present

## 2014-02-16 DIAGNOSIS — F1010 Alcohol abuse, uncomplicated: Secondary | ICD-10-CM

## 2014-02-16 DIAGNOSIS — R0609 Other forms of dyspnea: Secondary | ICD-10-CM

## 2014-02-16 DIAGNOSIS — Z79899 Other long term (current) drug therapy: Secondary | ICD-10-CM

## 2014-02-16 DIAGNOSIS — I10 Essential (primary) hypertension: Secondary | ICD-10-CM | POA: Diagnosis present

## 2014-02-16 DIAGNOSIS — Z7982 Long term (current) use of aspirin: Secondary | ICD-10-CM

## 2014-02-16 DIAGNOSIS — F329 Major depressive disorder, single episode, unspecified: Secondary | ICD-10-CM | POA: Diagnosis present

## 2014-02-16 DIAGNOSIS — R002 Palpitations: Secondary | ICD-10-CM | POA: Diagnosis not present

## 2014-02-16 DIAGNOSIS — Z87891 Personal history of nicotine dependence: Secondary | ICD-10-CM

## 2014-02-16 DIAGNOSIS — R0600 Dyspnea, unspecified: Secondary | ICD-10-CM

## 2014-02-16 DIAGNOSIS — F411 Generalized anxiety disorder: Secondary | ICD-10-CM | POA: Diagnosis present

## 2014-02-16 HISTORY — DX: Unspecified atrial fibrillation: I48.91

## 2014-02-16 LAB — I-STAT TROPONIN, ED: Troponin i, poc: 0 ng/mL (ref 0.00–0.08)

## 2014-02-16 LAB — COMPREHENSIVE METABOLIC PANEL
ALBUMIN: 4 g/dL (ref 3.5–5.2)
ALT: 29 U/L (ref 0–53)
AST: 30 U/L (ref 0–37)
Alkaline Phosphatase: 92 U/L (ref 39–117)
Anion gap: 14 (ref 5–15)
BUN: 17 mg/dL (ref 6–23)
CALCIUM: 9.2 mg/dL (ref 8.4–10.5)
CO2: 30 mEq/L (ref 19–32)
CREATININE: 1.05 mg/dL (ref 0.50–1.35)
Chloride: 97 mEq/L (ref 96–112)
GFR calc Af Amer: >90 mL/min (ref >90)
GFR, EST NON AFRICAN AMERICAN: 81 mL/min — AB (ref >90)
Glucose, Bld: 104 mg/dL — ABNORMAL HIGH (ref 70–99)
Potassium: 4.1 mEq/L (ref 3.7–5.3)
Sodium: 141 mEq/L (ref 137–147)
Total Bilirubin: 0.6 mg/dL (ref 0.3–1.2)
Total Protein: 7.8 g/dL (ref 6.0–8.3)

## 2014-02-16 LAB — CBC WITH DIFFERENTIAL/PLATELET
BASOS PCT: 0 % (ref 0–1)
Basophils Absolute: 0 10*3/uL (ref 0.0–0.1)
EOS PCT: 3 % (ref 0–5)
Eosinophils Absolute: 0.2 10*3/uL (ref 0.0–0.7)
HEMATOCRIT: 43.5 % (ref 39.0–52.0)
Hemoglobin: 15.2 g/dL (ref 13.0–17.0)
Lymphocytes Relative: 34 % (ref 12–46)
Lymphs Abs: 2.1 10*3/uL (ref 0.7–4.0)
MCH: 34.5 pg — ABNORMAL HIGH (ref 26.0–34.0)
MCHC: 34.9 g/dL (ref 30.0–36.0)
MCV: 98.6 fL (ref 78.0–100.0)
MONO ABS: 0.5 10*3/uL (ref 0.1–1.0)
Monocytes Relative: 9 % (ref 3–12)
Neutro Abs: 3.4 10*3/uL (ref 1.7–7.7)
Neutrophils Relative %: 54 % (ref 43–77)
Platelets: 289 10*3/uL (ref 150–400)
RBC: 4.41 MIL/uL (ref 4.22–5.81)
RDW: 12 % (ref 11.5–15.5)
WBC: 6.3 10*3/uL (ref 4.0–10.5)

## 2014-02-16 MED ORDER — DILTIAZEM HCL 25 MG/5ML IV SOLN
10.0000 mg | Freq: Once | INTRAVENOUS | Status: AC
  Administered 2014-02-16: 10 mg via INTRAVENOUS
  Filled 2014-02-16: qty 5

## 2014-02-16 MED ORDER — DEXTROSE 5 % IV SOLN
5.0000 mg/h | INTRAVENOUS | Status: DC
  Administered 2014-02-17: 5 mg/h via INTRAVENOUS

## 2014-02-16 MED ORDER — DILTIAZEM HCL ER COATED BEADS 240 MG PO CP24
240.0000 mg | ORAL_CAPSULE | Freq: Once | ORAL | Status: DC
  Filled 2014-02-16: qty 1

## 2014-02-16 MED ORDER — SODIUM CHLORIDE 0.9 % IV BOLUS (SEPSIS)
1000.0000 mL | Freq: Once | INTRAVENOUS | Status: AC
Start: 2014-02-16 — End: 2014-02-17
  Administered 2014-02-16: 1000 mL via INTRAVENOUS

## 2014-02-16 NOTE — Discharge Instructions (Signed)

## 2014-02-16 NOTE — ED Notes (Signed)
Current HR and rhythm reported to MD Ward. Verbal orders given for diltiazem and IV fluids. Patient no longer up for discharge.

## 2014-02-16 NOTE — ED Provider Notes (Signed)
TIME SEEN: 10:30 PM  CHIEF COMPLAINT:  Palpitations, nausea, dizziness  HPI: Patient is a 48 year old male with history of hypertension, paroxysmal atrial fibrillation not on anticoagulation (recent diagnosis thought 2/2 ETOH abuse) who presents emergency department with an episode of fluttering in his chest that started tonight. He did have a "lump" in his chest but denied chest pain.  No shortness of breath but did have nausea and lightheadedness. He was instructed by his doctor to take 25 mg of metoprolol if he had symptoms. He took one pill without relief. He states he then take another tablet before coming to the hospital. Upon my evaluation, patient reports he is feeling much better. He currently has no complaints. Denies any recent vomiting or diarrhea. No bloody stools or melena. No fever.  ROS: See HPI Constitutional: no fever  Eyes: no drainage  ENT: no runny nose   Cardiovascular:   chest pain  Resp: no SOB  GI: no vomiting GU: no dysuria Integumentary: no rash  Allergy: no hives  Musculoskeletal: no leg swelling  Neurological: no slurred speech ROS otherwise negative  PAST MEDICAL HISTORY/PAST SURGICAL HISTORY:  Past Medical History  Diagnosis Date  . Hypertension   . Anxiety   . Psoriasis   . Atrial fibrillation     MEDICATIONS:  Prior to Admission medications   Medication Sig Start Date End Date Taking? Authorizing Provider  aspirin EC 81 MG tablet Take 81 mg by mouth daily.   Yes Historical Provider, MD  citalopram (CELEXA) 20 MG tablet Take 20 mg by mouth daily. 10/08/13  Yes Historical Provider, MD  Cyanocobalamin (B-12 PO) Take 500 mg by mouth daily.    Yes Historical Provider, MD  metoprolol tartrate (LOPRESSOR) 25 MG tablet Take 0-25 mg by mouth 2 (two) times daily. If HR is <60 BPM, dont take med If HR is 60-100 BPM, takes 12.5mg  twice daily If HR > 100 BPM, takes 25mg  twice daily   Yes Historical Provider, MD  Multiple Vitamin (MULTIVITAMIN WITH MINERALS)  TABS Take 1 tablet by mouth daily.   Yes Historical Provider, MD  olmesartan (BENICAR) 40 MG tablet Take 40 mg by mouth daily.   Yes Historical Provider, MD    ALLERGIES:  No Known Allergies  SOCIAL HISTORY:  History  Substance Use Topics  . Smoking status: Former Smoker    Types: Cigarettes, Cigars  . Smokeless tobacco: Not on file  . Alcohol Use: Yes     Comment: wine at night to help relax    FAMILY HISTORY: No family history on file.  EXAM: BP 113/84  Pulse 69  Temp(Src) 98.7 F (37.1 C) (Oral)  Resp 13  Ht 6\' 2"  (1.88 m)  Wt 195 lb (88.451 kg)  BMI 25.03 kg/m2  SpO2 99% CONSTITUTIONAL: Alert and oriented and responds appropriately to questions. Well-appearing; well-nourished, pleasant, smiling, laughing HEAD: Normocephalic EYES: Conjunctivae clear, PERRL ENT: normal nose; no rhinorrhea; moist mucous membranes; pharynx without lesions noted NECK: Supple, no meningismus, no LAD; no thyromegaly CARD: RRR; S1 and S2 appreciated; no murmurs, no clicks, no rubs, no gallops RESP: Normal chest excursion without splinting or tachypnea; breath sounds clear and equal bilaterally; no wheezes, no rhonchi, no rales, no hypoxia or respiratory distress ABD/GI: Normal bowel sounds; non-distended; soft, non-tender, no rebound, no guarding BACK:  The back appears normal and is non-tender to palpation, there is no CVA tenderness EXT: Normal ROM in all joints; non-tender to palpation; no edema; normal capillary refill; no cyanosis  SKIN: Normal color for age and race; warm NEURO: Moves all extremities equally PSYCH: The patient's mood and manner are appropriate. Grooming and personal hygiene are appropriate.  MEDICAL DECISION MAKING: Patient here with episode of A. fib with RVR. Upon my arrival, patient is in a normal sinus rhythm with a heart rate in the 70s. He is asymptomatic. Labs are unremarkable including a negative troponin. No anemia or electrolyte abnormality. Will repeat EKG  but I feel he is safe to go home. He does not take any rate control medicine continuously but only when necessary. He states he has an appointment with a cardiologist next week. Discussed with him strict return precautions and supportive care instructions. He verbalizes understanding and is comfortable with plan.  ED PROGRESS: Patient went back into atrial fibrillation with RVR the rate of 120s to 130s prior to discharge. Will give dose of diltiazem bolus, infusion and monitor.  He has had an episode of bradycardia with a heart rate in the low 40s on a prior ER visit. I do not want to continue beta blockers at this time.   Patient is again symptomatic. He is slightly diaphoretic. If rate is not controlled with diltiazem, will consult cardiology.  12:50 AM  Pt's heart rate is still not controlled and he is symptomatic. Discussed with Dr. Radford Pax with cardiology who recommends medicine admission.   1:11 AM  D/w Dr. Alcario Drought hospitalist service for admission to telemetry, inpatient.     EKG Interpretation  Date/Time:  Saturday February 16 2014 21:12:04 EDT Ventricular Rate:  136 PR Interval:    QRS Duration: 86 QT Interval:  286 QTC Calculation: 430 R Axis:   66 Text Interpretation:  Atrial fibrillation with rapid ventricular response Nonspecific ST abnormality Abnormal ECG Confirmed by Drue Harr,  DO, Lakeitha Basques [phone]) on 02/16/2014 10:09:54 PM          EKG Interpretation  Date/Time:  Saturday February 16 2014 22:30:59 EDT Ventricular Rate:  70 PR Interval:  189 QRS Duration: 98 QT Interval:  378 QTC Calculation: 408 R Axis:   56 Text Interpretation:  Sinus rhythm Biatrial enlargement Confirmed by Cordon Gassett,  DO, Seanne Chirico ([id number]) on 02/16/2014 11:24:32 PM        CRITICAL CARE Performed by: Nyra Jabs   Total critical care time: 35 minutes  Critical care time was exclusive of separately billable procedures and treating other patients.  Critical care was necessary to treat or  prevent imminent or life-threatening deterioration.  Critical care was time spent personally by me on the following activities: development of treatment plan with patient and/or surrogate as well as nursing, discussions with consultants, evaluation of patient's response to treatment, examination of patient, obtaining history from patient or surrogate, ordering and performing treatments and interventions, ordering and review of laboratory studies, ordering and review of radiographic studies, pulse oximetry and re-evaluation of patient's condition.    [place], DO 02/17/14 [phone]

## 2014-02-16 NOTE — ED Notes (Signed)
Pt to ED for evaluation of fluttering in chest.  Pt was dx with a-fib in August, has been in sinus rhythm since discharge.  Pt felt chest fluttering around 7pm, HR at home was 145, pt took 1 Metoprolol with no change.  Admits to nausea and dizziness with symptoms, denies pain.  Respirations e/u.  Pt currently in a-fib, rate 130-150bpm.

## 2014-02-17 ENCOUNTER — Emergency Department (HOSPITAL_COMMUNITY): Payer: 59

## 2014-02-17 ENCOUNTER — Encounter (HOSPITAL_COMMUNITY): Payer: Self-pay | Admitting: *Deleted

## 2014-02-17 DIAGNOSIS — R002 Palpitations: Secondary | ICD-10-CM | POA: Diagnosis present

## 2014-02-17 DIAGNOSIS — Z7982 Long term (current) use of aspirin: Secondary | ICD-10-CM | POA: Diagnosis not present

## 2014-02-17 DIAGNOSIS — Z87891 Personal history of nicotine dependence: Secondary | ICD-10-CM | POA: Diagnosis not present

## 2014-02-17 DIAGNOSIS — F3289 Other specified depressive episodes: Secondary | ICD-10-CM | POA: Diagnosis present

## 2014-02-17 DIAGNOSIS — F329 Major depressive disorder, single episode, unspecified: Secondary | ICD-10-CM | POA: Diagnosis not present

## 2014-02-17 DIAGNOSIS — I4891 Unspecified atrial fibrillation: Secondary | ICD-10-CM | POA: Diagnosis present

## 2014-02-17 DIAGNOSIS — F411 Generalized anxiety disorder: Secondary | ICD-10-CM | POA: Diagnosis present

## 2014-02-17 DIAGNOSIS — F1010 Alcohol abuse, uncomplicated: Secondary | ICD-10-CM | POA: Diagnosis present

## 2014-02-17 DIAGNOSIS — I10 Essential (primary) hypertension: Secondary | ICD-10-CM | POA: Diagnosis present

## 2014-02-17 DIAGNOSIS — Z79899 Other long term (current) drug therapy: Secondary | ICD-10-CM | POA: Diagnosis not present

## 2014-02-17 LAB — I-STAT TROPONIN, ED: TROPONIN I, POC: 0 ng/mL (ref 0.00–0.08)

## 2014-02-17 MED ORDER — OFF THE BEAT BOOK
Freq: Once | Status: DC
  Filled 2014-02-17: qty 1

## 2014-02-17 MED ORDER — ASPIRIN EC 81 MG PO TBEC
81.0000 mg | DELAYED_RELEASE_TABLET | Freq: Every day | ORAL | Status: DC
  Administered 2014-02-17 – 2014-02-18 (×2): 81 mg via ORAL
  Filled 2014-02-17 (×2): qty 1

## 2014-02-17 MED ORDER — HEPARIN SODIUM (PORCINE) 5000 UNIT/ML IJ SOLN
5000.0000 [IU] | Freq: Three times a day (TID) | INTRAMUSCULAR | Status: DC
  Administered 2014-02-17 – 2014-02-18 (×3): 5000 [IU] via SUBCUTANEOUS
  Filled 2014-02-17 (×7): qty 1

## 2014-02-17 MED ORDER — ASPIRIN 81 MG PO CHEW
324.0000 mg | CHEWABLE_TABLET | Freq: Once | ORAL | Status: AC
  Administered 2014-02-17: 324 mg via ORAL
  Filled 2014-02-17: qty 4

## 2014-02-17 MED ORDER — IRBESARTAN 300 MG PO TABS
300.0000 mg | ORAL_TABLET | Freq: Every day | ORAL | Status: DC
Start: 2014-02-17 — End: 2014-02-18
  Administered 2014-02-17 – 2014-02-18 (×2): 300 mg via ORAL
  Filled 2014-02-17 (×2): qty 1

## 2014-02-17 MED ORDER — CITALOPRAM HYDROBROMIDE 20 MG PO TABS
20.0000 mg | ORAL_TABLET | Freq: Every day | ORAL | Status: DC
  Administered 2014-02-17 – 2014-02-18 (×2): 20 mg via ORAL
  Filled 2014-02-17 (×2): qty 1

## 2014-02-17 MED ORDER — SODIUM CHLORIDE 0.9 % IJ SOLN
3.0000 mL | Freq: Two times a day (BID) | INTRAMUSCULAR | Status: DC
  Administered 2014-02-17 – 2014-02-18 (×3): 3 mL via INTRAVENOUS

## 2014-02-17 MED ORDER — DILTIAZEM HCL 60 MG PO TABS
60.0000 mg | ORAL_TABLET | Freq: Four times a day (QID) | ORAL | Status: DC
  Administered 2014-02-17 – 2014-02-18 (×4): 60 mg via ORAL
  Filled 2014-02-17 (×8): qty 1

## 2014-02-17 NOTE — ED Notes (Signed)
MD Ward stated that patient may have PO fluids and food. Patient given Ginger ale and saltines.

## 2014-02-17 NOTE — Progress Notes (Signed)
[facility] PROGRESS NOTE  Evan [name] SAY:[id number] DOB: [birth year] DOA: 02/16/2014 PCP: No primary provider on file.  Assessment/Plan: Afib with RVR On cardizem drip, HR 70-120 this am on tele. Cardiology consulted ( Dr Mare Ferrari will see pt). switch to oral Cardizem once HR  better controlled. Recently  diagnosed afib and on low dose lopressor , dose increased subsequently. Recent cardiac w/up including echo and stress stress unremarkable except for biatrial enlargement. TSH. Normal . continue ASA Further plan for cardiology recs.  HTN  BP controlled. Continue benicar  Anxiety/ depression  continue celexa  DVT prophylaxis: sq heparin  Diet: cardiac   Code Status: full Family Communication: none at bedside Disposition Plan: home once stable     Consultants:  cardiology  Procedures:  none  Antibiotics:  none  HPI/Subjective: Admission H&P reviewed. Still c/o of off and on palpitations, no CP or SOB  Objective: Filed Vitals:   02/17/14 0249  BP: 141/98  Pulse: 91  Temp: 98.7 F (37.1 C)  Resp: 18    Intake/Output Summary (Last 24 hours) at 02/17/14 1011 Last data filed at 02/17/14 0900  Gross per 24 hour  Intake    360 ml  Output      0 ml  Net    360 ml   Filed Weights   02/16/14 2114 02/17/14 0249  Weight: 88.451 kg (195 lb) 90.719 kg (200 lb)    Exam:   General:  NAD   HEENT: moist oral mucosa  Cardiovascular: s1&s2 Irregular, no murmurs  Respiratory: clear b/l  Abdomen: soft, NT, ND, BS+  Musculoskeletal: warm, no edema  Data Reviewed: Basic Metabolic Panel:  Recent Labs Lab 02/16/14 2114  NA 141  K 4.1  CL 97  CO2 30  GLUCOSE 104*  BUN 17  CREATININE 1.05  CALCIUM 9.2   Liver Function Tests:  Recent Labs Lab 02/16/14 2114  AST 30  ALT 29  ALKPHOS 92  BILITOT 0.6  PROT 7.8  ALBUMIN 4.0   No results found for this basename: LIPASE, AMYLASE,  in the last 168 hours No results found for this basename:  AMMONIA,  in the last 168 hours CBC:  Recent Labs Lab 02/16/14 2114  WBC 6.3  NEUTROABS 3.4  HGB 15.2  HCT 43.5  MCV 98.6  PLT 289   Cardiac Enzymes: No results found for this basename: CKTOTAL, CKMB, CKMBINDEX, TROPONINI,  in the last 168 hours BNP (last 3 results) No results found for this basename: PROBNP,  in the last 8760 hours CBG: No results found for this basename: GLUCAP,  in the last 168 hours  No results found for this or any previous visit (from the past 240 hour(s)).   Studies: Dg Chest Port 1 View  02/17/2014   CLINICAL DATA:  Heart palpitations  EXAM: PORTABLE CHEST - 1 VIEW  COMPARISON:  Radiograph 01/09/2014  FINDINGS: Normal mediastinum and cardiac silhouette. Normal pulmonary vasculature. No evidence of effusion, infiltrate, or pneumothorax. No acute bony abnormality.  IMPRESSION: No acute cardiopulmonary process.   Electronically Signed   By: Suzy Bouchard M.D.   On: 02/17/2014 01:18    Scheduled Meds: . aspirin EC  81 mg Oral Daily  . citalopram  20 mg Oral Daily  . heparin  5,000 Units Subcutaneous 3 times per day  . irbesartan  300 mg Oral Daily  . off the beat book   Does not apply Once  . sodium chloride  3 mL Intravenous Q12H   Continuous Infusions: .  diltiazem (CARDIZEM) infusion 7.5 mg/hr (02/17/14 0232)      Time spent: 20 minutes    Antione Obar, [facility] Pager [phone] If 7PM-7AM, please contact night-coverage at [email or web address], password [facility] 02/17/2014, 10:11 AM  LOS: 1 day

## 2014-02-17 NOTE — Consult Note (Signed)
CARDIOLOGY CONSULT NOTE   Patient ID: Evan [name] MRN: [id number], DOB/AGE: April 10, 1966   Admit date: 02/16/2014 Date of Consult: 02/17/2014   Primary Physician: No primary provider on file. Primary Cardiologist: Dr. Ena Dawley  Pt. Profile  48 year old gentleman admitted last night with atrial fibrillation with rapid ventricular response.  Problem List  Past Medical History  Diagnosis Date  . Hypertension   . Anxiety   . Psoriasis   . Atrial fibrillation     Past Surgical History  Procedure Laterality Date  . Hernia repair Right     with mesh  . Carpal tunnel release       Allergies  No Known Allergies  HPI   This 48 year old gentleman is a cardiology patient of Dr. Meda Coffee.  He has a history of paroxysmal atrial fibrillation.  He was seen by Dr. Meda Coffee on 01/24/14 has a followup visit to an emergency room visit on 01/09/14 for right-sided chest discomfort secondary to mechanical fall and he was noted to be in atrial fibrillation with rapid ventricular response.  He himself was unaware of palpitations at that time.  He has had palpitations previously however and they usually resolve with rest and taking something for anxiety.  The patient does have a history of chronic anxiety.  The patient drinks moderate to heavy alcohol 4 glasses of wine daily. In the emergency room the patient was started on metoprolol.  When seen by Dr. Meda Coffee he was in normal sinus rhythm at 75 per minute.  Since then he has had intermittent palpitations which were worse last night. The patient had a treadmill Myoview stress test on 02/06/14 which was a low risk stress nuclear study with a very small area of basal inferoseptal mild ischemia.  His ejection fraction was 47% with mild inferior hypokinesis and mildly depressed overall systolic function.  The patient also had an echocardiogram on 02/06/14 which showed an ejection fraction of 50-55% and normal wall motion with no regional wall motion  abnormalities.  There was mild biatrial enlargement.  The right ventricular cavity size was mildly dilated. The patient denies any history of exertional chest pain or angina pectoris.  His family history is notable for a brother who had a heart attack at age 92. The patient is not diabetic.  He does have a history of borderline hypertension and is on Benicar Inpatient Medications  . aspirin EC  81 mg Oral Daily  . citalopram  20 mg Oral Daily  . diltiazem  60 mg Oral 4 times per day  . heparin  5,000 Units Subcutaneous 3 times per day  . irbesartan  300 mg Oral Daily  . off the beat book   Does not apply Once  . sodium chloride  3 mL Intravenous Q12H    Family History History reviewed. No pertinent family history.   Social History History   Social History  . Marital Status: Married    Spouse Name: [name]    Number of Children: [name]  . Years of Education: [name]   Occupational History  . Not on file.   Social History Main Topics  . Smoking status: Former Smoker    Types: Cigarettes, Cigars  . Smokeless tobacco: Not on file  . Alcohol Use: Yes     Comment: wine at night to help relax  . Drug Use: No  . Sexual Activity: Not on file   Other Topics Concern  . Not on file   Social History Narrative  . No narrative  on file     Review of Systems  General:  No chills, fever, night sweats or weight changes.  Cardiovascular:  No chest pain, dyspnea on exertion, edema, orthopnea,  paroxysmal nocturnal dyspnea.  Positive for intermittent rapid palpitations Dermatological: No rash, lesions/masses Respiratory: No cough, dyspnea Urologic: No hematuria, dysuria Abdominal:   No nausea, vomiting, diarrhea, bright red blood per rectum, melena, or hematemesis Neurologic:  No visual changes, wkns, changes in mental status. All other systems reviewed and are otherwise negative except as noted above.  Physical Exam  Blood pressure 141/98, pulse 91, temperature 98.7 F (37.1 C),  temperature source Oral, resp. rate 18, height 6\' 2"  (1.88 m), weight 200 lb (90.719 kg), SpO2 99.00%.  General: Pleasant, NAD Psych: Normal affect. Neuro: Alert and oriented X 3. Moves all extremities spontaneously. HEENT: Normal  Neck: Supple without bruits or JVD. Lungs:  Resp regular and unlabored, CTA. Heart: RRR no s3, s4, or murmurs. Abdomen: Soft, non-tender, non-distended, BS + x 4.  Extremities: No clubbing, cyanosis or edema. DP/PT/Radials 2+ and equal bilaterally.  Labs  No results found for this basename: CKTOTAL, CKMB, TROPONINI,  in the last 72 hours Lab Results  Component Value Date   WBC 6.3 02/16/2014   HGB 15.2 02/16/2014   HCT 43.5 02/16/2014   MCV 98.6 02/16/2014   PLT 289 02/16/2014    Recent Labs Lab 02/16/14 2114  NA 141  K 4.1  CL 97  CO2 30  BUN 17  CREATININE 1.05  CALCIUM 9.2  PROT 7.8  BILITOT 0.6  ALKPHOS 92  ALT 29  AST 30  GLUCOSE 104*   No results found for this basename: CHOL, HDL, LDLCALC, TRIG   No results found for this basename: DDIMER    Radiology/Studies  Dg Chest Port 1 View  02/17/2014   CLINICAL DATA:  Heart palpitations  EXAM: PORTABLE CHEST - 1 VIEW  COMPARISON:  Radiograph 01/09/2014  FINDINGS: Normal mediastinum and cardiac silhouette. Normal pulmonary vasculature. No evidence of effusion, infiltrate, or pneumothorax. No acute bony abnormality.  IMPRESSION: No acute cardiopulmonary process.   Electronically Signed   By: Suzy Bouchard M.D.   On: 02/17/2014 01:18    ECG  EKG on 02/16/14 shows normal sinus rhythm and biatrial enlargement  ASSESSMENT AND PLAN  1. paroxysmal atrial fibrillation, currently back in normal sinus rhythm after IV Cardizem drip. Chadssvasc score is 1 for hypertension. 2.  Excessive alcohol intake which may be contributing to his paroxysmal atrial fibrillation. 3. essential hypertension  Plan: Continue daily aspirin for anticoagulation.  I strongly advised him to stop alcohol ingestion.  We  will transition him to oral diltiazem.  We will observe overnight and anticipate that he will be able to be discharged tomorrow if stable.  He already has an appointment to see Dr. Meda Coffee on September 24.  She will be discussing his recent nuclear stress test with him at that time.  For breakthrough tachycardia arrhythmia he may continue to use Lopressor on a when necessary basis.  Signed, Darlin Coco, MD  02/17/2014, 1:03 PM

## 2014-02-17 NOTE — Progress Notes (Signed)
Utilization Review Completed.   Jacquese Cassarino, RN, BSN Nurse Case Manager  

## 2014-02-17 NOTE — H&P (Signed)
[facility] History and Physical  RAYVON DAKIN PTW:[id number] DOB: 12/14/1965 DOA: 02/16/2014  Referring physician: EDP PCP: No primary provider on file.   Chief Complaint: A.Fib RVR   HPI: Evan [name] is a 48 y.o. male who was formally diagnosed for the first time with A.Fib RVR on 8/12 although he thinks he has had it for quite some time before that.  Cardiology recommended that the patient be started on lopressor 12.5 mg PO BID.  Nuclear stress test done on 02/06/14 was negative.  Tonight he developed symptoms of palpitations and rapid HR.  He had been instructed by his cardiologist to take 25mg  metoprolol if he had symptoms.  He took 1 pill without relief, he took a second tablet before coming to the hospital.  By the time he arrived at the hospital he was asymptomatic, during his ED visit however, his HR went up to the 140s and he became symptomatic again, EDP does not feel comfortable increasing beta blockers due to HR in the low 40s on a prior ED visit.  He was treated with cardizem gtt and his HR has come down to the 110s and he is no longer symptomatic.  Review of Systems: Systems reviewed.  As above, otherwise negative  Past Medical History  Diagnosis Date  . Hypertension   . Anxiety   . Psoriasis   . Atrial fibrillation    Past Surgical History  Procedure Laterality Date  . Hernia repair Right     with mesh  . Carpal tunnel release     Social History:  reports that he has quit smoking. His smoking use included Cigarettes and Cigars. He smoked 0.00 packs per day. He does not have any smokeless tobacco history on file. He reports that he drinks alcohol. He reports that he does not use illicit drugs.  No Known Allergies  No family history on file.   Prior to Admission medications   Medication Sig Start Date End Date Taking? Authorizing Provider  aspirin EC 81 MG tablet Take 81 mg by mouth daily.   Yes Historical Provider, MD  citalopram (CELEXA) 20 MG tablet Take  20 mg by mouth daily. 10/08/13  Yes Historical Provider, MD  Cyanocobalamin (B-12 PO) Take 500 mg by mouth daily.    Yes Historical Provider, MD  metoprolol tartrate (LOPRESSOR) 25 MG tablet Take 0-25 mg by mouth 2 (two) times daily. If HR is <60 BPM, dont take med If HR is 60-100 BPM, takes 12.5mg  twice daily If HR > 100 BPM, takes 25mg  twice daily   Yes Historical Provider, MD  Multiple Vitamin (MULTIVITAMIN WITH MINERALS) TABS Take 1 tablet by mouth daily.   Yes Historical Provider, MD  olmesartan (BENICAR) 40 MG tablet Take 40 mg by mouth daily.   Yes Historical Provider, MD   Physical Exam: Filed Vitals:   02/17/14 0115  BP: 125/100  Pulse: 111  Temp:   Resp: 21    BP 125/100  Pulse 111  Temp(Src) 98.7 F (37.1 C) (Oral)  Resp 21  Ht 6\' 2"  (1.88 m)  Wt 88.451 kg (195 lb)  BMI 25.03 kg/m2  SpO2 97%  General Appearance:    Alert, oriented, no distress, appears stated age  Head:    Normocephalic, atraumatic  Eyes:    PERRL, EOMI, sclera non-icteric        Nose:   Nares without drainage or epistaxis. Mucosa, turbinates normal  Throat:   Moist mucous membranes. Oropharynx without erythema or exudate.  Neck:   Supple. No carotid bruits.  No thyromegaly.  No lymphadenopathy.   Back:     No CVA tenderness, no spinal tenderness  Lungs:     Clear to auscultation bilaterally, without wheezes, rhonchi or rales  Chest wall:    No tenderness to palpitation  Heart:    Irregularly irregular without murmurs, gallops, rubs  Abdomen:     Soft, non-tender, nondistended, normal bowel sounds, no organomegaly  Genitalia:    deferred  Rectal:    deferred  Extremities:   No clubbing, cyanosis or edema.  Pulses:   2+ and symmetric all extremities  Skin:   Skin color, texture, turgor normal, no rashes or lesions  Lymph nodes:   Cervical, supraclavicular, and axillary nodes normal  Neurologic:   CNII-XII intact. Normal strength, sensation and reflexes      throughout    Labs on Admission:   Basic Metabolic Panel:  Recent Labs Lab 02/16/14 2114  NA 141  K 4.1  CL 97  CO2 30  GLUCOSE 104*  BUN 17  CREATININE 1.05  CALCIUM 9.2   Liver Function Tests:  Recent Labs Lab 02/16/14 2114  AST 30  ALT 29  ALKPHOS 92  BILITOT 0.6  PROT 7.8  ALBUMIN 4.0   No results found for this basename: LIPASE, AMYLASE,  in the last 168 hours No results found for this basename: AMMONIA,  in the last 168 hours CBC:  Recent Labs Lab 02/16/14 2114  WBC 6.3  NEUTROABS 3.4  HGB 15.2  HCT 43.5  MCV 98.6  PLT 289   Cardiac Enzymes: No results found for this basename: CKTOTAL, CKMB, CKMBINDEX, TROPONINI,  in the last 168 hours  BNP (last 3 results) No results found for this basename: PROBNP,  in the last 8760 hours CBG: No results found for this basename: GLUCAP,  in the last 168 hours  Radiological Exams on Admission: Dg Chest Port 1 View  02/17/2014   CLINICAL DATA:  Heart palpitations  EXAM: PORTABLE CHEST - 1 VIEW  COMPARISON:  Radiograph 01/09/2014  FINDINGS: Normal mediastinum and cardiac silhouette. Normal pulmonary vasculature. No evidence of effusion, infiltrate, or pneumothorax. No acute bony abnormality.  IMPRESSION: No acute cardiopulmonary process.   Electronically Signed   By: Suzy Bouchard M.D.   On: 02/17/2014 01:18    EKG: Independently reviewed.  Assessment/Plan Principal Problem:   Atrial fibrillation with RVR   1. A.Fib RVR -  1. Rate control with cardizem gtt 2. Currently asymptomatic and heart rate controlled 3. Call cards regarding strategy for long term control in AM.  Dr. Radford Pax was called by EDP, re call cards in AM.  Code Status: Full  Family Communication: No family in room Disposition Plan: Admit to inpatient   Time spent: 50 min  GARDNER, JARED M. [facility] Pager [phone]  If 7AM-7PM, please contact the day team taking care of the patient [email or web address] Password [email or web address] 02/17/2014, 1:41 AM

## 2014-02-18 DIAGNOSIS — I10 Essential (primary) hypertension: Secondary | ICD-10-CM

## 2014-02-18 DIAGNOSIS — F1010 Alcohol abuse, uncomplicated: Secondary | ICD-10-CM

## 2014-02-18 DIAGNOSIS — R0989 Other specified symptoms and signs involving the circulatory and respiratory systems: Secondary | ICD-10-CM

## 2014-02-18 DIAGNOSIS — R0609 Other forms of dyspnea: Secondary | ICD-10-CM

## 2014-02-18 MED ORDER — DILTIAZEM HCL ER COATED BEADS 240 MG PO CP24: 240.0000 mg | ORAL_CAPSULE | Freq: Every day | ORAL | Status: DC

## 2014-02-18 MED ORDER — DILTIAZEM HCL ER COATED BEADS 240 MG PO CP24
240.0000 mg | ORAL_CAPSULE | Freq: Every day | ORAL | Status: DC
  Administered 2014-02-18: 240 mg via ORAL
  Filled 2014-02-18 (×2): qty 1

## 2014-02-18 NOTE — Progress Notes (Signed)
Patient Name: Evan [name] Date of Encounter: 02/18/2014     Principal Problem:   Atrial fibrillation with RVR    SUBJECTIVE  Denies any CP or SOB. States he missed a dose of metoprolol on Sat morning as his HR was less than 60 at the time, and palpitation started Sat night.   CURRENT MEDS . aspirin EC  81 mg Oral Daily  . citalopram  20 mg Oral Daily  . diltiazem  60 mg Oral 4 times per day  . heparin  5,000 Units Subcutaneous 3 times per day  . irbesartan  300 mg Oral Daily  . off the beat book   Does not apply Once  . sodium chloride  3 mL Intravenous Q12H    OBJECTIVE  Filed Vitals:   02/17/14 1407 02/17/14 1750 02/17/14 2100 02/18/14 0600  BP: 110/71 110/70 123/77 120/76  Pulse: 59  62 52  Temp: 99.1 F (37.3 C)  98.4 F (36.9 C) 98.3 F (36.8 C)  TempSrc: Oral  Oral Oral  Resp: 18  18 18   Height:      Weight:    199 lb 6.4 oz (90.447 kg)  SpO2: 97%  98% 98%    Intake/Output Summary (Last 24 hours) at 02/18/14 0844 Last data filed at 02/17/14 1047  Gross per 24 hour  Intake    363 ml  Output      0 ml  Net    363 ml   Filed Weights   02/16/14 2114 02/17/14 0249 02/18/14 0600  Weight: 195 lb (88.451 kg) 200 lb (90.719 kg) 199 lb 6.4 oz (90.447 kg)    PHYSICAL EXAM  General: Pleasant, NAD. Neuro: Alert and oriented X 3. Moves all extremities spontaneously. Psych: Normal affect. HEENT:  Normal  Neck: Supple without bruits or JVD. Lungs:  Resp regular and unlabored, CTA. Heart: RRR no s3, s4, or murmurs. Abdomen: Soft, non-tender, non-distended, BS + x 4.  Extremities: No clubbing, cyanosis or edema. DP/PT/Radials 2+ and equal bilaterally.  Accessory Clinical Findings  CBC  Recent Labs  02/16/14 2114  WBC 6.3  NEUTROABS 3.4  HGB 15.2  HCT 43.5  MCV 98.6  PLT 740   Basic Metabolic Panel  Recent Labs  02/16/14 2114  NA 141  K 4.1  CL 97  CO2 30  GLUCOSE 104*  BUN 17  CREATININE 1.05  CALCIUM 9.2   Liver Function  Tests  Recent Labs  02/16/14 2114  AST 30  ALT 29  ALKPHOS 92  BILITOT 0.6  PROT 7.8  ALBUMIN 4.0    TELE NSR with HR 50-80s    ECG  NSR with HR 70s  Echocardiogram  LV EF: 50% - 55%  ------------------------------------------------------------------- Indications: 427.31 Atrial Fibrillation.  ------------------------------------------------------------------- History: PMH: Acquired from the patient and from the patient&'s chart. Risk factors: Former tobacco use. Hypertension.  ------------------------------------------------------------------- Study Conclusions  - Left ventricle: The cavity size was normal. Wall thickness was normal. Systolic function was normal. The estimated ejection fraction was in the range of 50% to 55%. Wall motion was normal; there were no regional wall motion abnormalities. Left ventricular diastolic function parameters were normal. - Aortic root: The aortic root was mildly dilated. - Left atrium: The atrium was mildly dilated. - Right ventricle: The cavity size was mildly dilated. - Right atrium: The atrium was mildly dilated.  Impressions:  - Normal LV function; mild biatrial enlargement; mild RVE.       Radiology/Studies  Dg Chest Port 1  View  02/17/2014   CLINICAL DATA:  Heart palpitations  EXAM: PORTABLE CHEST - 1 VIEW  COMPARISON:  Radiograph 01/09/2014  FINDINGS: Normal mediastinum and cardiac silhouette. Normal pulmonary vasculature. No evidence of effusion, infiltrate, or pneumothorax. No acute bony abnormality.  IMPRESSION: No acute cardiopulmonary process.   Electronically Signed   By: Suzy Bouchard M.D.   On: 02/17/2014 01:18    ASSESSMENT AND PLAN  1. PAF with RVR - converted on diltiazem gtt  - CHADS-VAsc score 1 (HTN), on ASA  - Echo 02/06/2014 EF 50-55%, mild biatrial dilatation  - myoview 02/07/2014 mildly abnormal stress test, EF 47% wth mild inferior hypokinesis, very small area of basal inferoseptal mild  ischemia, low risk study. No need for cath per Dr. Meda Coffee  - previous on metoprolol at home, will switch to 240mg  PO diltiazem on discharge  - likely discharge today  2. ETOH abuse  - likely contribute to recurrent PAF 3. HTN 4. Anxiety  Signed, Almyra Deforest PA-C Pager: [phone] Patient seen and examined and history reviewed and amended. Agree with above findings and plan. Will pursue rate control strategy with ASA for now. Plan DC today. Will need follow up with Dr. Meda Coffee. If he continues to have frequent episodes of afib may need to consider AAD therapy. Stressed importance of abstinence from ETOH.  Elexius Minar [place], [place] 02/18/2014 9:58 AM

## 2014-02-18 NOTE — Discharge Summary (Signed)
Physician Discharge Summary  Evan [name] ZOX:[id number] DOB: [birth year] DOA: 02/16/2014  PCP: No primary provider on file.  Admit date: 02/16/2014 Discharge date: 02/18/2014  Time spent: >35 minutes  Recommendations for Outpatient Follow-up:  F/u with cardiology as scheduled F/u with PCP in 2-3 weeks  Discharge Diagnoses:  Principal Problem:   Atrial fibrillation with RVR   Discharge Condition: stable   Diet recommendation: low sodium   Filed Weights   02/16/14 2114 02/17/14 0249 02/18/14 0600  Weight: 88.451 kg (195 lb) 90.719 kg (200 lb) 90.447 kg (199 lb 6.4 oz)    History of present illness:  Evan [name] is a 48 y.o. male who was formally diagnosed for the first time with A.Fib RVR on 8/12 although he thinks he has had it for quite some time before that. Cardiology recommended that the patient be started on lopressor 12.5 mg PO BID. Nuclear stress test done on 02/06/14 was negative. Tonight he developed symptoms of palpitations and rapid HR. He had been instructed by his cardiologist to take 25mg  metoprolol if he had symptoms. He took 1 pill without relief, he took a second tablet before coming to the hospital.  -By the time he arrived at the hospital he was asymptomatic, during his ED visit however, his HR went up to the 140s and he became symptomatic again, EDP does not feel comfortable increasing beta blockers due to HR in the low 40s on a prior ED visit. He was treated with cardizem gtt and his HR has come down to the 110s and he is no longer symptomatic.   Hospital Course:   1. A fib RVR; resolved on IV cardizem, transitioned to PO cardizem;  -Recent cardiac w/up including echo and stress stress unremarkable except for biatrial enlargement. TSH. Normal . continue ASA; stop etoh -f/u with Dr. Meda Coffee on September 24.  2. HTN, d/c ARB BP stable on cardizem; f/u outpatient  3. Anxiety/ depression continue celexa    Consultations:  Cardiology   Discharge Exam: Filed  Vitals:   02/18/14 0600  BP: 120/76  Pulse: 52  Temp: 98.3 F (36.8 C)  Resp: 18    General: alert Cardiovascular: s1,s2 rrr Respiratory: CTA BL  Discharge Instructions  Discharge Instructions   Diet - low sodium heart healthy    Complete by:  As directed      Discharge instructions    Complete by:  As directed   Please follow up with cardiology as scheduled     Increase activity slowly    Complete by:  As directed             Medication List    STOP taking these medications       metoprolol tartrate 25 MG tablet  Commonly known as:  LOPRESSOR     olmesartan 40 MG tablet  Commonly known as:  BENICAR      TAKE these medications       aspirin EC 81 MG tablet  Take 81 mg by mouth daily.     B-12 PO  Take 500 mg by mouth daily.     citalopram 20 MG tablet  Commonly known as:  CELEXA  Take 20 mg by mouth daily.     diltiazem 240 MG 24 hr capsule  Commonly known as:  CARDIZEM CD  Take 1 capsule (240 mg total) by mouth daily.     multivitamin with minerals Tabs tablet  Take 1 tablet by mouth daily.  No Known Allergies     Follow-up Information   Follow up with Dorothy Spark, MD. (as scheduled)    Specialty:  Cardiology   Contact information:   [address] [place] [id number] [phone]        The results of significant diagnostics from this hospitalization (including imaging, microbiology, ancillary and laboratory) are listed below for reference.    Significant Diagnostic Studies: Dg Chest Port 1 View  02/17/2014   CLINICAL DATA:  Heart palpitations  EXAM: PORTABLE CHEST - 1 VIEW  COMPARISON:  Radiograph 01/09/2014  FINDINGS: Normal mediastinum and cardiac silhouette. Normal pulmonary vasculature. No evidence of effusion, infiltrate, or pneumothorax. No acute bony abnormality.  IMPRESSION: No acute cardiopulmonary process.   Electronically Signed   By: Suzy Bouchard M.D.   On: 02/17/2014 01:18     Microbiology: No results found for this or any previous visit (from the past 240 hour(s)).   Labs: Basic Metabolic Panel:  Recent Labs Lab 02/16/14 2114  NA 141  K 4.1  CL 97  CO2 30  GLUCOSE 104*  BUN 17  CREATININE 1.05  CALCIUM 9.2   Liver Function Tests:  Recent Labs Lab 02/16/14 2114  AST 30  ALT 29  ALKPHOS 92  BILITOT 0.6  PROT 7.8  ALBUMIN 4.0   No results found for this basename: LIPASE, AMYLASE,  in the last 168 hours No results found for this basename: AMMONIA,  in the last 168 hours CBC:  Recent Labs Lab 02/16/14 2114  WBC 6.3  NEUTROABS 3.4  HGB 15.2  HCT 43.5  MCV 98.6  PLT 289   Cardiac Enzymes: No results found for this basename: CKTOTAL, CKMB, CKMBINDEX, TROPONINI,  in the last 168 hours BNP: BNP (last 3 results) No results found for this basename: PROBNP,  in the last 8760 hours CBG: No results found for this basename: GLUCAP,  in the last 168 hours     Signed:  Kinnie Feil  [facility] 02/18/2014, 9:54 AM

## 2014-02-21 ENCOUNTER — Encounter: Payer: Self-pay | Admitting: Cardiology

## 2014-02-21 ENCOUNTER — Ambulatory Visit (INDEPENDENT_AMBULATORY_CARE_PROVIDER_SITE_OTHER): Payer: 59 | Admitting: Cardiology

## 2014-02-21 VITALS — BP 120/80 | HR 60 | Ht 74.0 in | Wt 200.0 lb

## 2014-02-21 DIAGNOSIS — R9439 Abnormal result of other cardiovascular function study: Secondary | ICD-10-CM | POA: Insufficient documentation

## 2014-02-21 DIAGNOSIS — I4891 Unspecified atrial fibrillation: Secondary | ICD-10-CM

## 2014-02-21 DIAGNOSIS — Z7901 Long term (current) use of anticoagulants: Secondary | ICD-10-CM

## 2014-02-21 DIAGNOSIS — Z5181 Encounter for therapeutic drug level monitoring: Secondary | ICD-10-CM

## 2014-02-21 MED ORDER — APIXABAN 5 MG PO TABS: 5.0000 mg | ORAL_TABLET | Freq: Two times a day (BID) | ORAL | Status: DC

## 2014-02-21 NOTE — Progress Notes (Signed)
Patient ID: Algis Liming, male   DOB: 07/30/1965, 48 y.o.   MRN: [id number]     Patient Name: Evan [name] Date of Encounter: 02/21/2014  Primary Care Provider:  Vena Austria, MD Primary Cardiologist:  Dorothy Spark  Problem List   Past Medical History  Diagnosis Date  . Hypertension   . Anxiety   . Psoriasis   . Atrial fibrillation    Past Surgical History  Procedure Laterality Date  . Hernia repair Right     with mesh  . Carpal tunnel release     Allergies  No Known Allergies  HPI  Evan [name] is a 48 y.o caucasian male with psoriasis, anxiety, alcohol abuse, tobacco use who presented as a direct transfer from urgent care center with AF.  Pt presented to the urgent care clinic with right sided chest pain. That morning pt had mechanical fall as he slipped on gravel. Since then he has had right sided chest pain. While at urgent care, pt was noted to be AF with RVR. No palpitations at that time. Pt has had palpitations previously, which were sudden onset, resolved with anxiolytic and rest. Pt has anxiety and takes ativan PRN. Pt denied left sided chest pain, dyspnea, nausea, vomiting. He does have chronic diarrhea. He also complaints of episodes of hot flashes which come on after he showers. Pt drinks 4 glasses of wine daily. Smokes tobacco 1-2 cigs per month.  Pt spontaneously converted to NSR while in the ED. Initial ECG here with sinus bradycardia and PACs. At the time of my evaluate, pt in NSR with HR of 75.  The patient went to the ER on 9/20 for another episode od a-fib with RVR, cardioverted with iv Cardizem. He was started on Cardizem CD 240 mg PO QD. He has Metoprolol 25 mg PRN for acute episodes.  He underwent 7 day e-cardio monitoring - 4/7 days in a-fib. Echo showed normal LVEF 50-55%, normal DD, mild LAE. Lexiscan nuclear stress test showed a very small area of basal inferoseptal mild ischemia.  FH of premature CAD - brother MI at age 62.   Home  Medications  Prior to Admission medications   Medication Sig Start Date End Date Taking? Authorizing Provider  aspirin EC 81 MG tablet Take 81 mg by mouth daily.   Yes Historical Provider, MD  citalopram (CELEXA) 20 MG tablet Take 20 mg by mouth daily. 10/08/13  Yes Historical Provider, MD  Cyanocobalamin (B-12 PO) Take 500 mg by mouth daily.    Yes Historical Provider, MD  LORazepam (ATIVAN) 0.5 MG tablet Take 0.5 mg by mouth every 8 (eight) hours as needed for anxiety.   Yes Historical Provider, MD  metoprolol tartrate (LOPRESSOR) 12.5 mg TABS tablet Take 12.5 mg by mouth 2 (two) times daily. 01/09/14  Yes Dot Lanes, MD  Multiple Vitamin (MULTIVITAMIN WITH MINERALS) TABS Take 1 tablet by mouth daily.   Yes Historical Provider, MD  olmesartan (BENICAR) 40 MG tablet Take 40 mg by mouth daily.   Yes Historical Provider, MD    Family History  No family history on file.  Social History  History   Social History  . Marital Status: Married    Spouse Name: [name]    Number of Children: [name]  . Years of Education: [name]   Occupational History  . Not on file.   Social History Main Topics  . Smoking status: Former Smoker    Types: Cigarettes, Cigars  . Smokeless tobacco: Not on  file  . Alcohol Use: Yes     Comment: wine at night to help relax  . Drug Use: No  . Sexual Activity: Not on file   Other Topics Concern  . Not on file   Social History Narrative  . No narrative on file     Review of Systems, as per HPI, otherwise negative General:  No chills, fever, night sweats or weight changes.  Cardiovascular:  No chest pain, dyspnea on exertion, edema, orthopnea, palpitations, paroxysmal nocturnal dyspnea. Dermatological: No rash, lesions/masses Respiratory: No cough, dyspnea Urologic: No hematuria, dysuria Abdominal:   No nausea, vomiting, diarrhea, bright red blood per rectum, melena, or hematemesis Neurologic:  No visual changes, wkns, changes in mental status. All other  systems reviewed and are otherwise negative except as noted above.  Physical Exam  Blood pressure 120/80, pulse 60, height 6\' 2"  (1.88 m), weight 200 lb (90.719 kg).  General: Pleasant, NAD Psych: Normal affect. Neuro: Alert and oriented X 3. Moves all extremities spontaneously. HEENT: Normal  Neck: Supple without bruits or JVD. Lungs:  Resp regular and unlabored, CTA. Heart: RRR no s3, s4, or murmurs. Abdomen: Soft, non-tender, non-distended, BS + x 4.  Extremities: No clubbing, cyanosis or edema. DP/PT/Radials 2+ and equal bilaterally.  Labs:  No results found for this basename: CKTOTAL, CKMB, TROPONINI,  in the last 72 hours Lab Results  Component Value Date   WBC 6.3 02/16/2014   HGB 15.2 02/16/2014   HCT 43.5 02/16/2014   MCV 98.6 02/16/2014   PLT 289 02/16/2014    No results found for this basename: DDIMER   No components found with this basename: POCBNP,     Component Value Date/Time   NA 141 02/16/2014 2114   K 4.1 02/16/2014 2114   CL 97 02/16/2014 2114   CO2 30 02/16/2014 2114   GLUCOSE 104* 02/16/2014 2114   BUN 17 02/16/2014 2114   CREATININE 1.05 02/16/2014 2114   CALCIUM 9.2 02/16/2014 2114   PROT 7.8 02/16/2014 2114   ALBUMIN 4.0 02/16/2014 2114   AST 30 02/16/2014 2114   ALT 29 02/16/2014 2114   ALKPHOS 92 02/16/2014 2114   BILITOT 0.6 02/16/2014 2114   GFRNONAA 81* 02/16/2014 2114   GFRAA >90 02/16/2014 2114   No results found for this basename: CHOL,  HDL,  LDLCALC,  TRIG    Accessory Clinical Findings  Echocardiogram - 02/06/2014 Left ventricle: The cavity size was normal. Wall thickness was normal. Systolic function was normal. The estimated ejection fraction was in the range of 50% to 55%. Wall motion was normal; there were no regional wall motion abnormalities. Left ventricular diastolic function parameters were normal. - Aortic root: The aortic root was mildly dilated. - Left atrium: The atrium was mildly dilated. - Right ventricle: The cavity size was  mildly dilated. - Right atrium: The atrium was mildly dilated.  Impressions:  - Normal LV function; mild biatrial enlargement; mild RVE.  Lexiscan nuclear stress test:  Impression  Exercise Capacity: Lexiscan with no exercise.  BP Response: Normal blood pressure response.  Clinical Symptoms: No significant symptoms noted.  ECG Impression: No significant ECG changes with Lexiscan.  Comparison with Prior Nuclear Study: No previous nuclear study performed  Overall Impression: Low risk stress nuclear study with a very small area of basal inferoseptal mild ischemia..  LV Ejection Fraction: 47%. LV Wall Motion: mild inferior hypokinesis and mildly depressed overall systolic function  ECG - Sinus bradycardia, early repolarization, otherwise normal    Assessment &  Plan  1.  New dg of paroxysmal a-fib with RVR - almost daily episodes, RVR poorly tolearted - continue cardizem CD 240 mg po daily - metoprolol for RVR episodes - if inefficient amiodarone would be the next step - he stopped drinking alcohol, hopefully improvement in episodes frequency - starting Eliquis 5 mg po BID in 2 days (screening colonoscopy tomorrow), d/c aspirin, explained risk of increased bleeding with alcohol use, he understands  2. Alcohol abuse -  As above  3. Hypertension - controlled  4. Abnormal stress test - a very small area of basal inferoseptal mild ischemia, the patient is asymptomatic, we will not proceed with further ischemia work up, treat risk factors aggressively - check lipids, but avoid Flecanide in the future  Long discussion and teaching regarding alcohol use, anticoagulation provided, the total time spent with the patient 45 minutes. Follow up in 1 month.    Dorothy Spark, MD, [facility] 02/21/2014, 10:11 AM

## 2014-02-21 NOTE — Patient Instructions (Addendum)
Your physician has recommended you make the following change in your medication:    STOP TAKING ASPIRIN NOW   START TAKING ELIQUIS 5 MG TWICE DAILY- PLEASE DO NOT START TAKING THIS MEDICATION UNTIL THIS Saturday 02/23/14, DUE TO YOUR SCHEDULED COLONOSCOPY TOMORROW 02/22/14.     Your physician recommends that you schedule a follow-up appointment in: [place]

## 2014-02-22 ENCOUNTER — Telehealth: Payer: Self-pay | Admitting: Cardiology

## 2014-02-22 ENCOUNTER — Other Ambulatory Visit: Payer: Self-pay | Admitting: Gastroenterology

## 2014-02-22 NOTE — Telephone Encounter (Signed)
New message      Pt had a colonoscopy today---He is still at the doctors office.  He was to start eliquis today but since they had to remove polyps they (Dr Michail Sermon) did not want him to start eliquis until Monday.  Please call ASAP to let them know if it is OK or let them know something so that they can send the patient home.

## 2014-02-22 NOTE — Telephone Encounter (Signed)
Jana Half calling from [place] stating Dr. Michail Sermon did colonoscopy today and removed some polyps. Dr. Michail Sermon does not want him to start the Eliquis until Monday and was calling to advise [place].  Jana Half states pt went in and out of bradycardia (rate 35) at least 6 times and also was in and out of AFib.  States she gave pt the rhythm strips to bring to Dr. Meda Coffee. Advised would forward information to Dr. Meda Coffee.

## 2014-02-23 NOTE — Telephone Encounter (Signed)
Its ok not to start Eliquis till Monday. I called the patient and instructed to start taking Eliquis on Monday morning. He understands.   Dorothy Spark 02/23/2014

## 2014-03-08 ENCOUNTER — Telehealth: Payer: Self-pay | Admitting: Cardiology

## 2014-03-08 NOTE — Telephone Encounter (Signed)
New Prob    Pt states his blood pressure has been high in last 5 days. Highest reading 168/102. He also reports new onset nausea x 3 days. Pt is concerned and would like to speak to a nurse. Please call.

## 2014-03-08 NOTE — Telephone Encounter (Signed)
Pt calling to inform Dr Meda Coffee and nurse that his BP has been elevated for the past 5 days.  Current recorded readings are:  143/85,  147/87,  149/90,  134/82,  168/102.  Pt denies any cp, palpitations, dizziness, sob, pre-synocpal or syncopal episodes, LEE visual disturbances, HA, or any stroke like symptoms at this time.  Pt states he has some nausea, but has had this since his recent colonoscopy a week ago.  Pt states he is in no acute distress at this time.  Pt states he is being compliant with taking all his medications.  Pt states he is drinking 2 glasses of wine a night, which he states is an improvement for him.  Pt states his current HR is normal at 80 and has not gotten over 100.  Pt has hx of afib w RVR.  Pt states he has not received his results yet from his recent colonoscopy, as they sent a biopsy off from that test.  Informed the pt given his stability and non-cardiac and no neuro complaints, he should continue with his current treatment regimen, limit his alcohol intake to nothing if possible, limit sodium intake, drink plenty of fluids/h2o, keep monitoring BP and HR and take PRN meds accordingly with readings.  Informed the pt if for any reason he becomes acutely distressed, has any cp, sob, palpitations, increased HR with afib, LEE, HA, visual disturbances, or any stroke like symptoms then he should seek immediate attention.  Informed the pt that Dr Meda Coffee is out of the office today, but I will forward this message to her for further review and recommendation and follow-up thereafter.  Pt has a f/u OV with Dr Meda Coffee on 10/23. Pt verbalized understanding and agrees with this plan.

## 2014-03-13 ENCOUNTER — Encounter (INDEPENDENT_AMBULATORY_CARE_PROVIDER_SITE_OTHER): Payer: 59

## 2014-03-13 ENCOUNTER — Telehealth: Payer: Self-pay | Admitting: Cardiology

## 2014-03-13 ENCOUNTER — Encounter: Payer: Self-pay | Admitting: *Deleted

## 2014-03-13 ENCOUNTER — Telehealth: Payer: Self-pay | Admitting: *Deleted

## 2014-03-13 DIAGNOSIS — I4891 Unspecified atrial fibrillation: Secondary | ICD-10-CM

## 2014-03-13 MED ORDER — RIVAROXABAN 20 MG PO TABS: 20.0000 mg | ORAL_TABLET | Freq: Every day | ORAL | Status: DC

## 2014-03-13 NOTE — Telephone Encounter (Signed)
New message     Heart rate has been irregular for the past couple of nights.  It went from 40-140 within 5 minutes.  Please call

## 2014-03-13 NOTE — Telephone Encounter (Signed)
Pt calling to make Dr Meda Coffee aware that he's going in and out of Afib with his rate ranging from 40-140s.  Pt states that he's been experiencing this for the last 2 days, only in the evenings.  Pt reports when his HR gets down to the 40s, he starts feeling dizzy and lightheaded.  Pt reports sob increases as well when his Afib exacerbates.  Pt states that currently he is asymptomatic at a regular rate of 75, with no complaints.  Pt states that during the Afib episodes, he is unable to obtain an accurate BP.  Informed the pt that the BP would be inaccurate during the afib.  Pt states he is being compliant with taking all his meds except for the PRN Metoprolol 25 mg for the breakthrough afib HR>100.  Pt states he is scared to take the PRN Metoprolol because his HR drops down in the 40s, and he doesn't want to bottom out.  Informed the pt that Dr Meda Coffee is out of the office today, but I will talk with our DOD flex and DOD further recommendations.  Informed the pt that I will call back with their recommendations.  Pt verbalized understanding and agrees with this plan.

## 2014-03-13 NOTE — Progress Notes (Signed)
Patient ID: Evan [name], male   DOB: 07/11/1965, 48 y.o.   MRN: [id number] [facility] 48 hour holter monitor applied to patient.

## 2014-03-13 NOTE — Telephone Encounter (Signed)
Pt came in for 48 hour holter monitor per [name] DOD flex today and monitor tech noted that pt had rash on abdomen area.  On assessment pt has noted rash/ hives on left side of chest and trunk area, and lower extremities.  Noted pts eyes were red and puffy as well.  Pt states he went to urgent care for this and they dx with contact dermatitis and ordered for pt to use hydrocortisone cream.  Informed our PharmD Elberta Leatherwood and she assessed pts skin and noted rash/hives on trunk area and lower extremities.  Pt denies any respiratory complaints at this time.  Pt states the hydrocortisone cream worked a little bit.  Pt denies any new use of detergent, soaps, clothes, shoes.  Pt however did just start taking Eliquis 5 mg BID on the first Monday of October, and pt states the rash started shortly thereafter.  Per Elberta Leatherwood PharmD the pt should discontinue taking his Eliquis now and start taking Xarelto 20 mg po daily, as this is needed to address his Afib.  Gay Filler assessed kidney function, and pt cleared to take Xarelto.  Gay Filler PharmD also instructed pt to take either Benadryl (PRN), Zantac, Zyrtec for the reaction.  Informed the pt that Benadryl will make him drowsy, but others will not.  Pt did have 48 hour holter monitor placed on for c/o of the Afib.  Pt will be back into the office to see Dr Meda Coffee next Friday 10/23.  Will route this message to Dr Meda Coffee for further review as well.  Will also update pts allergies in epic.

## 2014-03-13 NOTE — Telephone Encounter (Signed)
Per DOD flex NP [name], the pt should come in to get a 48 hour holter monitor put on for further evaluation of Afib, and continue taking current regimen until next OV with Dr Meda Coffee on 03/22/14.  Scheduled the pt for 48 hour holter today at 3:30 pm at our office. Made pt aware of new order, and scheduled appt.  Pt verbalized understanding and agrees with this plan.  Will route this message to Dr Meda Coffee for further review.

## 2014-03-14 ENCOUNTER — Telehealth: Payer: Self-pay | Admitting: Cardiology

## 2014-03-14 MED ORDER — AMLODIPINE BESYLATE 2.5 MG PO TABS: 2.5000 mg | ORAL_TABLET | Freq: Every day | ORAL | Status: DC

## 2014-03-14 NOTE — Telephone Encounter (Signed)
Follow up ° ° ° ° °Returned Ivy's call °

## 2014-03-14 NOTE — Telephone Encounter (Addendum)
Notified the pt that per Dr Meda Coffee he should start taking amlodipine 2.5 mg po daily for his HTN and continue wearing the 48 hour holter monitor.  Pt also reports that his rash/hives from yesterday has showed improvement since he stopped taking his Eliquis and started taking the Xarelto, and zyrtec.  Pt verbalized understanding and agrees with this plan.

## 2014-03-14 NOTE — Telephone Encounter (Signed)
LMTCB

## 2014-03-14 NOTE — Telephone Encounter (Signed)
New problem   Pt calling to give you an update on medication change. Please call pt.

## 2014-03-14 NOTE — Telephone Encounter (Signed)
Notified the pt that per Dr Meda Coffee he should start taking amlodipine 2.5 mg po daily for his HTN and continue wearing the 48 hour holter monitor.  Pt also reports that his rash/hives from yesterday has showed improvement since he stopped taking his Eliquis and started taking the Xarelto, and zyrtec.  Pt verbalized understanding and agrees with this plan.

## 2014-03-14 NOTE — Telephone Encounter (Signed)
I would start him on amlodipine 2.5 mg po daily for hypertension. Agree with Holter. KN

## 2014-03-14 NOTE — Telephone Encounter (Signed)
LMTCB in regards to med update

## 2014-03-14 NOTE — Addendum Note (Signed)
Addended by: Nuala Alpha on: 03/14/2014 06:46 PM   Modules accepted: Orders

## 2014-03-15 ENCOUNTER — Telehealth: Payer: Self-pay

## 2014-03-15 NOTE — Telephone Encounter (Signed)
Patient in office to return heart monitor and requested to be checked before going home. Patient switched from Eliquis to Reynolds on 10/14 when he came in the office with itchy patches and hives on his trunk.  On Wednesday, Evan [name] was pink all over, his eyes were red, and he was slightly swollen. His trunk had a few scattered pin-point rashes that the patient was treating with hydrocortisone.  Today, Evan [name] is much improved. His color is normal and his eyes are no longer red. He has 2 new itchy patches on his trunk that appeared when the patient removed his monitor leads. The other old spots are healing and no longer itch. Spoke with [name] and instructed the patient to continue to monitor his skin. Recommended to take benadryl, claritin or pepcid and to continue to use hydrocortisone on the new spots. Instructed the patient that if he gets SOB or rash worsens to go to Urgent Care.  Patient to make F/U phone call to the office Monday.

## 2014-03-19 ENCOUNTER — Emergency Department (HOSPITAL_COMMUNITY): Payer: 59

## 2014-03-19 ENCOUNTER — Encounter (HOSPITAL_COMMUNITY): Payer: Self-pay | Admitting: Emergency Medicine

## 2014-03-19 ENCOUNTER — Emergency Department (HOSPITAL_COMMUNITY)
Admission: EM | Admit: 2014-03-19 | Discharge: 2014-03-19 | Disposition: A | Discharge status: Home / Self Care | Payer: 59 | Attending: Emergency Medicine | Admitting: Emergency Medicine

## 2014-03-19 DIAGNOSIS — Z79899 Other long term (current) drug therapy: Secondary | ICD-10-CM | POA: Insufficient documentation

## 2014-03-19 DIAGNOSIS — I4891 Unspecified atrial fibrillation: Secondary | ICD-10-CM

## 2014-03-19 DIAGNOSIS — Z872 Personal history of diseases of the skin and subcutaneous tissue: Secondary | ICD-10-CM | POA: Insufficient documentation

## 2014-03-19 DIAGNOSIS — R0789 Other chest pain: Secondary | ICD-10-CM

## 2014-03-19 DIAGNOSIS — I10 Essential (primary) hypertension: Secondary | ICD-10-CM | POA: Diagnosis not present

## 2014-03-19 DIAGNOSIS — R9439 Abnormal result of other cardiovascular function study: Secondary | ICD-10-CM

## 2014-03-19 DIAGNOSIS — I480 Paroxysmal atrial fibrillation: Secondary | ICD-10-CM

## 2014-03-19 DIAGNOSIS — F419 Anxiety disorder, unspecified: Secondary | ICD-10-CM | POA: Diagnosis not present

## 2014-03-19 DIAGNOSIS — E876 Hypokalemia: Secondary | ICD-10-CM

## 2014-03-19 DIAGNOSIS — Z87891 Personal history of nicotine dependence: Secondary | ICD-10-CM | POA: Insufficient documentation

## 2014-03-19 LAB — CBC
HCT: 42.4 % (ref 39.0–52.0)
HEMOGLOBIN: 14.8 g/dL (ref 13.0–17.0)
MCH: 34.4 pg — ABNORMAL HIGH (ref 26.0–34.0)
MCHC: 34.9 g/dL (ref 30.0–36.0)
MCV: 98.6 fL (ref 78.0–100.0)
Platelets: 216 10*3/uL (ref 150–400)
RBC: 4.3 MIL/uL (ref 4.22–5.81)
RDW: 12.5 % (ref 11.5–15.5)
WBC: 7.7 10*3/uL (ref 4.0–10.5)

## 2014-03-19 LAB — BASIC METABOLIC PANEL
Anion gap: 16 — ABNORMAL HIGH (ref 5–15)
BUN: 14 mg/dL (ref 6–23)
CO2: 26 mEq/L (ref 19–32)
Calcium: 9 mg/dL (ref 8.4–10.5)
Chloride: 97 mEq/L (ref 96–112)
Creatinine, Ser: 0.89 mg/dL (ref 0.50–1.35)
GFR calc Af Amer: >90 mL/min (ref >90)
GLUCOSE: 90 mg/dL (ref 70–99)
POTASSIUM: 3.5 meq/L — AB (ref 3.7–5.3)
SODIUM: 139 meq/L (ref 137–147)

## 2014-03-19 LAB — I-STAT TROPONIN, ED
Troponin i, poc: 0.01 ng/mL (ref 0.00–0.08)
Troponin i, poc: 0 ng/mL (ref 0.00–0.08)

## 2014-03-19 LAB — MAGNESIUM: Magnesium: 1.8 mg/dL (ref 1.5–2.5)

## 2014-03-19 MED ORDER — DRONEDARONE HCL 400 MG PO TABS: 400.0000 mg | ORAL_TABLET | Freq: Two times a day (BID) | ORAL | Status: DC

## 2014-03-19 MED ORDER — DRONEDARONE HCL 400 MG PO TABS
400.0000 mg | ORAL_TABLET | Freq: Two times a day (BID) | ORAL | Status: DC
  Administered 2014-03-19: 400 mg via ORAL
  Filled 2014-03-19 (×3): qty 1

## 2014-03-19 MED ORDER — DILTIAZEM LOAD VIA INFUSION
10.0000 mg | Freq: Once | INTRAVENOUS | Status: AC
  Administered 2014-03-19: 10 mg via INTRAVENOUS
  Filled 2014-03-19: qty 10

## 2014-03-19 MED ORDER — POTASSIUM CHLORIDE CRYS ER 20 MEQ PO TBCR
40.0000 meq | EXTENDED_RELEASE_TABLET | Freq: Once | ORAL | Status: AC
  Administered 2014-03-19: 40 meq via ORAL
  Filled 2014-03-19: qty 2

## 2014-03-19 MED ORDER — DILTIAZEM HCL 100 MG IV SOLR
5.0000 mg/h | INTRAVENOUS | Status: DC
  Administered 2014-03-19: 5 mg/h via INTRAVENOUS

## 2014-03-19 NOTE — ED Provider Notes (Signed)
CSN: [id number]     Arrival date & time 03/19/14  [id number] History   First MD Initiated Contact with Patient 03/19/14 [phone]     Chief Complaint  Patient presents with  . Atrial Fibrillation     (Consider location/radiation/quality/duration/timing/severity/associated sxs/prior Treatment) HPI 48 year old male presents to the emergency department from home via EMS with complaint of rapid A. fib.  Patient reports he has a history of paroxysmal A. fib.  He is on xarelto.  Patient has been instructed that if he has atrial fibrillation at home, he is to take the Toprol.  He reports that starting around 9:00 he felt himself go into atrial fibrillation.  Patient took metoprolol at 10, 11:30 and 1 AM without resolution in symptoms.  Patient reports he feels funny due to the A. fib, but denies any chest pain or shortness of breath.  Patient takes Cardizem currently.  Patient seen in the ER for similar episode and converted on Cardizem drip. Past Medical History  Diagnosis Date  . Hypertension   . Anxiety   . Psoriasis   . Atrial fibrillation    Past Surgical History  Procedure Laterality Date  . Hernia repair Right     with mesh  . Carpal tunnel release     History reviewed. No pertinent family history. History  Substance Use Topics  . Smoking status: Former Smoker    Types: Cigarettes, Cigars  . Smokeless tobacco: Not on file  . Alcohol Use: Yes     Comment: wine at night to help relax    Review of Systems  See History of Present Illness; otherwise all other systems are reviewed and negative   Allergies  Eliquis  Home Medications   Prior to Admission medications   Medication Sig Start Date End Date Taking? Authorizing Provider  amLODipine (NORVASC) 2.5 MG tablet Take 1 tablet (2.5 mg total) by mouth daily. 03/14/14  Yes Dorothy Spark, MD  citalopram (CELEXA) 20 MG tablet Take 20 mg by mouth daily. 10/08/13  Yes Historical Provider, MD  clobetasol ointment (TEMOVATE) 5.42 % Apply  1 application topically 2 (two) times daily as needed (itching).   Yes Historical Provider, MD  Cyanocobalamin (B-12 PO) Take 500 mg by mouth daily.    Yes Historical Provider, MD  diltiazem (CARDIZEM CD) 240 MG 24 hr capsule Take 1 capsule (240 mg total) by mouth daily. 02/18/14  Yes Kinnie Feil, MD  LORazepam (ATIVAN) 0.5 MG tablet Take 1 tab by mouth as needed for anxiety   Yes Historical Provider, MD  metoprolol tartrate (LOPRESSOR) 25 MG tablet Take 25-50 mg by mouth daily as needed (onset of adfib). Take 1- 2 tablets by mouth if pt has onset of afib or if heartrate goes pass 100   Yes Historical Provider, MD  Multiple Vitamin (MULTIVITAMIN WITH MINERALS) TABS Take 1 tablet by mouth daily.   Yes Historical Provider, MD  rivaroxaban (XARELTO) 20 MG TABS tablet Take 1 tablet (20 mg total) by mouth daily with supper. 03/13/14  Yes Dorothy Spark, MD   BP 139/90  Pulse 99  Temp(Src) 98.7 F (37.1 C) (Oral)  Resp 16  Ht 6\' 2"  (1.88 m)  Wt 200 lb (90.719 kg)  BMI 25.67 kg/m2  SpO2 96% Physical Exam  Nursing note and vitals reviewed. Constitutional: He is oriented to person, place, and time. He appears well-developed and well-nourished.  HENT:  Head: Normocephalic and atraumatic.  Nose: Nose normal.  Mouth/Throat: Oropharynx is clear and moist.  Eyes:  Conjunctivae and EOM are normal. Pupils are equal, round, and reactive to light.  Neck: Normal range of motion. Neck supple. No JVD present. No tracheal deviation present. No thyromegaly present.  Cardiovascular: Normal heart sounds and intact distal pulses.  Exam reveals no gallop and no friction rub.   No murmur heard. Tachycardia with irregular rate  Pulmonary/Chest: Effort normal and breath sounds normal. No stridor. No respiratory distress. He has no wheezes. He has no rales. He exhibits no tenderness.  Abdominal: Soft. Bowel sounds are normal. He exhibits no distension and no mass. There is no tenderness. There is no rebound and  no guarding.  Musculoskeletal: Normal range of motion. He exhibits no edema and no tenderness.  Lymphadenopathy:    He has no cervical adenopathy.  Neurological: He is alert and oriented to person, place, and time. He displays normal reflexes. He exhibits normal muscle tone. Coordination normal.  Skin: Skin is warm and dry. No rash noted. No erythema. No pallor.  Psychiatric: He has a normal mood and affect. His behavior is normal. Judgment and thought content normal.    ED Course  Procedures (including critical care time) Labs Review Labs Reviewed  CBC - Abnormal; Notable for the following:    MCH 34.4 (*)    All other components within normal limits  BASIC METABOLIC PANEL - Abnormal; Notable for the following:    Potassium 3.5 (*)    Anion gap 16 (*)    All other components within normal limits  Randolm Idol, ED    Imaging Review Dg Chest Port 1 View  03/19/2014   CLINICAL DATA:  Atrial fibrillation.  EXAM: PORTABLE CHEST - 1 VIEW  COMPARISON:  02/17/2014  FINDINGS: The heart size and mediastinal contours are within normal limits. Both lungs are clear. The visualized skeletal structures are unremarkable.  IMPRESSION: No active disease.   Electronically Signed   By: Lucienne Capers M.D.   On: 03/19/2014 03:52     EKG Interpretation   Date/Time:  Tuesday March 19 2014 03:22:58 EDT Ventricular Rate:  118 PR Interval:    QRS Duration: 91 QT Interval:  329 QTC Calculation: 461 R Axis:   53 Text Interpretation:  Atrial fibrillation Minimal ST depression, lateral  leads afib new from prior ekg Confirmed by Donzell Coller  MD, Quantavia Frith ([id number]) on  03/19/2014 3:44:46 AM     CRITICAL CARE Performed by: Kalman Drape Total critical care time: 90 min Critical care time was exclusive of separately billable procedures and treating other patients. Critical care was necessary to treat or prevent imminent or life-threatening deterioration. Critical care was time spent personally by me on  the following activities: development of treatment plan with patient and/or surrogate as well as nursing, discussions with consultants, evaluation of patient's response to treatment, examination of patient, obtaining history from patient or surrogate, ordering and performing treatments and interventions, ordering and review of laboratory studies, ordering and review of radiographic studies, pulse oximetry and re-evaluation of patient's condition.  MDM   Final diagnoses:  Atrial fibrillation with RVR    48 year old male with paroxysmal A. fib currently with RVR.  Home treatment with metoprolol has not to help symptoms.  Plan for diltiazem bolus and drip, will discuss with cardiology for possible conversion as he is anticoagulated.  5:51 AM Pt without conversion to sinus rhythm.  No significant change with home use of metoprolol, diltiazem.  Rate staying in 115 range.  Will d/w cardiology conversion vs dig or amiodarone.  6:37 AM D/w  Dr Marlou Porch with cardiology who will see in ED.    Kalman Drape, MD 03/19/14 [phone]

## 2014-03-19 NOTE — ED Notes (Signed)
Per Dr Sharol Given Titrate cardizem up to 62ml/hr

## 2014-03-19 NOTE — ED Notes (Signed)
Per [facility] - pt c/o palpitations and irregular heartrate approx 120s HR that began yesterday evening approx 2100 - took x3 metoprolol - on EMS arrival to scene pt w/ HR in 70s, BP 130/100 - pt denies chest pain or shortness of breath at present.

## 2014-03-19 NOTE — Consult Note (Addendum)
Admit date: 03/19/2014 Referring Physician  Dr. Sharol Given Primary Physician Vena Austria, MD Primary Cardiologist  Meda Coffee Reason for Consultation  AFIB  HPI: 48 year old male with paroxysmal atrial fibrillation, having episodes almost on a nightly basis with ejection fraction of 50% on echocardiogram, possible mild ischemia on nuclear stress test here in the emergency room once again after taking 3 metoprolol tablets last night secondary to onset of atrial fibrillation with rapid ventricular response. He has not been on an antiarrhythmic. After using Holter monitor, a rash developed on his chest area. He was taking Eliquis at the time and was thought that he may have an allergy to this. It may have been the leads he thinks. Nonetheless, he is currently on Xarelto having started approximately 5 days ago. He has not missed a dose.  He felt some mild twinges of chest discomfort as well as racing heartbeat. He has also had symptoms of bradycardia/dizziness at times. Holter monitor demonstrated heart rates of 40-140.  He drinks alcohol but he states that he has cut back and he is not drinking on a nightly basis. He took some Benadryl last week but has avoided Sudafed.  He has had multiple recent visits the emergency room/hospital given his atrial fibrillation.  Currently he appears comfortable in bed with rate controlled atrial fibrillation heart rate in the 60s after diltiazem at 15 per hour.    PMH:   Past Medical History  Diagnosis Date  . Hypertension   . Anxiety   . Psoriasis   . Atrial fibrillation     PSH:   Past Surgical History  Procedure Laterality Date  . Hernia repair Right     with mesh  . Carpal tunnel release     Allergies:  Eliquis Prior to Admit Meds:   Prior to Admission medications   Medication Sig Start Date End Date Taking? Authorizing Provider  amLODipine (NORVASC) 2.5 MG tablet Take 1 tablet (2.5 mg total) by mouth daily. 03/14/14  Yes Dorothy Spark, MD  citalopram (CELEXA) 20 MG tablet Take 20 mg by mouth daily. 10/08/13  Yes Historical Provider, MD  clobetasol ointment (TEMOVATE) 7.59 % Apply 1 application topically 2 (two) times daily as needed (itching).   Yes Historical Provider, MD  Cyanocobalamin (B-12 PO) Take 500 mg by mouth daily.    Yes Historical Provider, MD  diltiazem (CARDIZEM CD) 240 MG 24 hr capsule Take 1 capsule (240 mg total) by mouth daily. 02/18/14  Yes Kinnie Feil, MD  LORazepam (ATIVAN) 0.5 MG tablet Take 1 tab by mouth as needed for anxiety   Yes Historical Provider, MD  metoprolol tartrate (LOPRESSOR) 25 MG tablet Take 25-50 mg by mouth daily as needed (onset of adfib). Take 1- 2 tablets by mouth if pt has onset of afib or if heartrate goes pass 100   Yes Historical Provider, MD  Multiple Vitamin (MULTIVITAMIN WITH MINERALS) TABS Take 1 tablet by mouth daily.   Yes Historical Provider, MD  rivaroxaban (XARELTO) 20 MG TABS tablet Take 1 tablet (20 mg total) by mouth daily with supper. 03/13/14  Yes Dorothy Spark, MD   Fam HX:   History reviewed. No pertinent family history. Social HX:    History   Social History  . Marital Status: Married    Spouse Name: [name]    Number of Children: [name]  . Years of Education: [name]   Occupational History  . Not on file.   Social History Main Topics  . Smoking  status: Former Smoker    Types: Cigarettes, Cigars  . Smokeless tobacco: Not on file  . Alcohol Use: Yes     Comment: wine at night to help relax  . Drug Use: No  . Sexual Activity: Not on file   Other Topics Concern  . Not on file   Social History Narrative  . No narrative on file     ROS: Denies any strokelike symptoms, fevers, bleeding, syncope, orthopnea, PND. Atypical chest pain noted. No significant shortness of breath. All 11 ROS were addressed and are negative except what is stated in the HPI   Physical Exam: Blood pressure 116/87, pulse 78, temperature 98.2 F (36.8 C), temperature  source Oral, resp. rate 14, height 6\' 2"  (1.88 m), weight 200 lb (90.719 kg), SpO2 96.00%.   General: Well developed, well nourished, in no acute distress Head: Eyes PERRLA, No xanthomas.   Normal cephalic and atramatic  Lungs:   Clear bilaterally to auscultation and percussion. Normal respiratory effort. No wheezes, no rales. Heart:   Irregularly irregular, normal rate Pulses are 2+ & equal. No murmur, rubs, gallops.  No carotid bruit. No JVD.  No abdominal bruits.  Abdomen: Bowel sounds are positive, abdomen soft and non-tender without masses. No hepatosplenomegaly. Msk:  Back normal. Normal strength and tone for age. Extremities:  No clubbing, cyanosis or edema.  DP +1 tattoos noted Neuro: Alert and oriented X 3, non-focal, MAE x 4 GU: Deferred Rectal: Deferred Psych:  Good affect, responds appropriately      Labs: Lab Results  Component Value Date   WBC 7.7 03/19/2014   HGB 14.8 03/19/2014   HCT 42.4 03/19/2014   MCV 98.6 03/19/2014   PLT 216 03/19/2014     Recent Labs Lab 03/19/14 0335  NA 139  K 3.5*  CL 97  CO2 26  BUN 14  CREATININE 0.89  CALCIUM 9.0  GLUCOSE 90        Radiology:  Dg Chest Port 1 View  03/19/2014   CLINICAL DATA:  Atrial fibrillation.  EXAM: PORTABLE CHEST - 1 VIEW  COMPARISON:  02/17/2014  FINDINGS: The heart size and mediastinal contours are within normal limits. Both lungs are clear. The visualized skeletal structures are unremarkable.  IMPRESSION: No active disease.   Electronically Signed   By: Lucienne Capers M.D.   On: 03/19/2014 03:52   Personally viewed.  EKG:  03/19/14-atrial for ablation heart rate 118 beats per minute. No ST segment changes. Normal QT interval. Personally viewed.   Echocardiogram 02/06/14-ejection fraction 50-55%, mildly dilated left atrium 41 mm. Nuclear stress test 02/07/14-possible small area of basal inferoseptal ischemia, ejection fraction captivated 47%. Low risk.  ASSESSMENT/PLAN:    48 year old male  with paroxysmal episodes of atrial fibrillation, symptomatic with bradycardia at times, mildly abnormal nuclear stress test although low risk, mildly dilated left atrium of 41 mm, normal troponin mildly decreased potassium 3.5.  1. Paroxysmal atrial fibrillation-he is quite symptomatic and continues to have almost might be episodes he states. This is despite Cardizem CD 240 mg once a day as well as when necessary metoprolol. Because of this, I will initiate Multaq 400 mg twice a day, continue his Cardizem PO. I will stop amlodipine. I will stop his diltiazem IV drip. I'm okay with him being discharged from the emergency room.  I discussed his case with Dr. Meda Coffee and Dr. Rayann Heman verbally and he will work him in to atrial fibrillation clinic on Friday at 2:30 PM. Since his nuclear stress test  demonstrates possible mild abnormality and ejection fraction is slightly reduced, we will avoid flecainide.   Discussed continued to decrease in alcohol use.  Phone number for atrial fibrillation clinic is [phone] if he has problems between now and Friday, he may call this number.  2. Hypokalemia-repleting potassium.  3. Chest pain-atypical, likely noncardiac. Troponin less far is normal. Okay with discharge.  Candee Furbish, MD  03/19/2014  9:37 AM

## 2014-03-19 NOTE — Discharge Instructions (Signed)
Please take Multaq twice daily with meal.  Follow up with Cardiologist as discussed for further management of your atrial fibrillation.    Atrial Fibrillation Atrial fibrillation is a type of irregular heart rhythm (arrhythmia). During atrial fibrillation, the upper chambers of the heart (atria) quiver continuously in a chaotic pattern. This causes an irregular and often rapid heart rate.  Atrial fibrillation is the result of the heart becoming overloaded with disorganized signals that tell it to beat. These signals are normally released one at a time by a part of the right atrium called the sinoatrial node. They then travel from the atria to the lower chambers of the heart (ventricles), causing the atria and ventricles to contract and pump blood as they pass. In atrial fibrillation, parts of the atria outside of the sinoatrial node also release these signals. This results in two problems. First, the atria receive so many signals that they do not have time to fully contract. Second, the ventricles, which can only receive one signal at a time, beat irregularly and out of rhythm with the atria.  There are three types of atrial fibrillation:   Paroxysmal. Paroxysmal atrial fibrillation starts suddenly and stops on its own within a week.  Persistent. Persistent atrial fibrillation lasts for more than a week. It may stop on its own or with treatment.  Permanent. Permanent atrial fibrillation does not go away. Episodes of atrial fibrillation may lead to permanent atrial fibrillation. Atrial fibrillation can prevent your heart from pumping blood normally. It increases your risk of stroke and can lead to heart failure.  CAUSES   Heart conditions, including a heart attack, heart failure, coronary artery disease, and heart valve conditions.   Inflammation of the sac that surrounds the heart (pericarditis).  Blockage of an artery in the lungs (pulmonary embolism).  Pneumonia or other  infections.  Chronic lung disease.  Thyroid problems, especially if the thyroid is overactive (hyperthyroidism).  Caffeine, excessive alcohol use, and use of some illegal drugs.   Use of some medicines, including certain decongestants and diet pills.  Heart surgery.   Birth defects.  Sometimes, no cause can be found. When this happens, the atrial fibrillation is called lone atrial fibrillation. The risk of complications from atrial fibrillation increases if you have lone atrial fibrillation and you are age 89 years or older. RISK FACTORS  Heart failure.  Coronary artery disease.  Diabetes mellitus.   High blood pressure (hypertension).   Obesity.   Other arrhythmias.   Increased age. SIGNS AND SYMPTOMS   A feeling that your heart is beating rapidly or irregularly.   A feeling of discomfort or pain in your chest.   Shortness of breath.   Sudden light-headedness or weakness.   Getting tired easily when exercising.   Urinating more often than normal (mainly when atrial fibrillation first begins).  In paroxysmal atrial fibrillation, symptoms may start and suddenly stop. DIAGNOSIS  Your health care provider may be able to detect atrial fibrillation when taking your pulse. Your health care provider may have you take a test called an ambulatory electrocardiogram (ECG). An ECG records your heartbeat patterns over a 24-hour period. You may also have other tests, such as:  Transthoracic echocardiogram (TTE). During echocardiography, sound waves are used to evaluate how blood flows through your heart.  Transesophageal echocardiogram (TEE).  Stress test. There is more than one type of stress test. If a stress test is needed, ask your health care provider about which type is best for you.  Chest X-ray exam.  Blood tests.  Computed tomography (CT). TREATMENT  Treatment may include:  Treating any underlying conditions. For example, if you have an overactive  thyroid, treating the condition may correct atrial fibrillation.  Taking medicine. Medicines may be given to control a rapid heart rate or to prevent blood clots, heart failure, or a stroke.  Having a procedure to correct the rhythm of the heart:  Electrical cardioversion. During electrical cardioversion, a controlled, low-energy shock is delivered to the heart through your skin. If you have chest pain, very low blood pressure, or sudden heart failure, this procedure may need to be done as an emergency.  Catheter ablation. During this procedure, heart tissues that send the signals that cause atrial fibrillation are destroyed.  Surgical ablation. During this surgery, thin lines of heart tissue that carry the abnormal signals are destroyed. This procedure can either be an open-heart surgery or a minimally invasive surgery. With the minimally invasive surgery, small cuts are made to access the heart instead of a large opening.  Pulmonary venous isolation. During this surgery, tissue around the veins that carry blood from the lungs (pulmonary veins) is destroyed. This tissue is thought to carry the abnormal signals. HOME CARE INSTRUCTIONS   Take medicines only as directed by your health care provider. Some medicines can make atrial fibrillation worse or recur.  If blood thinners were prescribed by your health care provider, take them exactly as directed. Too much blood-thinning medicine can cause bleeding. If you take too little, you will not have the needed protection against stroke and other problems.  Perform blood tests at home if directed by your health care provider. Perform blood tests exactly as directed.  Quit smoking if you smoke.  Do not drink alcohol.  Do not drink caffeinated beverages such as coffee, soda, and some teas. You may drink decaffeinated coffee, soda, or tea.   Maintain a healthy weight.Do not use diet pills unless your health care provider approves. They may make  heart problems worse.   Follow diet instructions as directed by your health care provider.  Exercise regularly as directed by your health care provider.  Keep all follow-up visits as directed by your health care provider. This is important. PREVENTION  The following substances can cause atrial fibrillation to recur:   Caffeinated beverages.  Alcohol.  Certain medicines, especially those used for breathing problems.  Certain herbs and herbal medicines, such as those containing ephedra or ginseng.  Illegal drugs, such as cocaine and amphetamines. Sometimes medicines are given to prevent atrial fibrillation from recurring. Proper treatment of any underlying condition is also important in helping prevent recurrence.  SEEK MEDICAL CARE IF:  You notice a change in the rate, rhythm, or strength of your heartbeat.  You suddenly begin urinating more frequently.  You tire more easily when exerting yourself or exercising. SEEK IMMEDIATE MEDICAL CARE IF:   You have chest pain, abdominal pain, sweating, or weakness.  You feel nauseous.  You have shortness of breath.  You suddenly have swollen feet and ankles.  You feel dizzy.  Your face or limbs feel numb or weak.  You have a change in your vision or speech. MAKE SURE YOU:   Understand these instructions.  Will watch your condition.  Will get help right away if you are not doing well or get worse. Document Released: 05/17/2005 Document Revised: 10/01/2013 Document Reviewed: 06/27/2012 [facility] Patient Information 2015 [place], [place]. This information is not intended to replace advice given to you  by your health care provider. Make sure you discuss any questions you have with your health care provider. ° °

## 2014-03-22 ENCOUNTER — Ambulatory Visit: Payer: Self-pay | Admitting: Cardiology

## 2014-03-22 ENCOUNTER — Ambulatory Visit (HOSPITAL_COMMUNITY)
Admit: 2014-03-22 | Discharge: 2014-03-22 | Disposition: A | Discharge status: Home / Self Care | Payer: 59 | Attending: Nurse Practitioner | Admitting: Nurse Practitioner

## 2014-03-22 ENCOUNTER — Encounter (HOSPITAL_COMMUNITY): Payer: Self-pay | Admitting: Nurse Practitioner

## 2014-03-22 VITALS — BP 125/82 | HR 70 | Ht 74.0 in | Wt 198.0 lb

## 2014-03-22 DIAGNOSIS — Z7901 Long term (current) use of anticoagulants: Secondary | ICD-10-CM | POA: Diagnosis not present

## 2014-03-22 DIAGNOSIS — Z79899 Other long term (current) drug therapy: Secondary | ICD-10-CM | POA: Insufficient documentation

## 2014-03-22 DIAGNOSIS — F419 Anxiety disorder, unspecified: Secondary | ICD-10-CM | POA: Insufficient documentation

## 2014-03-22 DIAGNOSIS — I10 Essential (primary) hypertension: Secondary | ICD-10-CM | POA: Insufficient documentation

## 2014-03-22 DIAGNOSIS — I480 Paroxysmal atrial fibrillation: Secondary | ICD-10-CM

## 2014-03-22 DIAGNOSIS — Z87891 Personal history of nicotine dependence: Secondary | ICD-10-CM | POA: Diagnosis not present

## 2014-03-22 NOTE — Progress Notes (Signed)
Primary Care Physician: Vena Austria, MD Primary Cardiologist: Dr. Meda Coffee Primary Electrophysiologist:Dr. Allred Referring Physician:Dr. Serge Main is a 48 y.o. male with a h/o paroxysmal/ persistent atrial fibrillation who presents for consultation in the [facility].  The patient was initially diagnosed with atrial fibrillation  in August after presenting  to Urgent Care with symptoms of  a fall and rt sided chest pain and afib was noted. However, pt feels like he has been having symptoms more than 18-24 months and misdiagnosed with anxiety disorder.  He has had several visits to the ER recently due to symptomatic afib with v rates in the 140's. Last visit was 10/20 at which time multaq was started. He has prn metoprolol to use for HR's over 100.He is currently on xatelto for  CHA2DS2-VASc Score and unadjusted Ischemic Stroke Rate (% per year) is equal to2.2 % stroke rate/year from a score of 2(? Cad, borderline positive stress test and HTN). Flecainide iis being avoided due to previous mildly abnormal stress test. For the last two nights since starting Multaq, he has not noticed any afib in the late evening, early am. He reports that he drinks 2-3 glasses a wine a night for relaxation and to obtain sleep. He denies any snoring history. No smoking history.  Today, he denies symptoms of palpitations, chest pain, shortness of breath, orthopnea, PND, lower extremity edema, dizziness, presyncope, syncope, snoring, daytime somnolence, bleeding, or neurologic sequela. The patient is tolerating medications without difficulties and is otherwise without complaint today.    Atrial Fibrillation Risk Factors:  he does not have symptoms or diagnosis of sleep apnea.  he does not have a history of rheumatic fever.  he does have a history of alcohol use.  he has a BMI of Body mass index is 25.41 kg/(m^2).Marland Kitchen Filed Weights   03/22/14 1430  Weight: 198 lb  (89.812 kg)    LA size:41cm   Atrial Fibrillation Management history:  Previous antiarrhythmic drugs: currently multaq, nor previous AAD  Previous cardioversions: none  Previous ablations: none  CHADS2VASC score: 2  Anticoagulation history: Eliquis(rash), currently xarelto   Past Medical History  Diagnosis Date  . Hypertension   . Anxiety   . Psoriasis   . Atrial fibrillation    Past Surgical History  Procedure Laterality Date  . Hernia repair Right     with mesh  . Carpal tunnel release      Current Outpatient Prescriptions  Medication Sig Dispense Refill  . citalopram (CELEXA) 20 MG tablet Take 20 mg by mouth daily.      . clobetasol ointment (TEMOVATE) 5.88 % Apply 1 application topically 2 (two) times daily as needed (itching).      . Cyanocobalamin (B-12 PO) Take 500 mg by mouth daily.       Marland Kitchen diltiazem (CARDIZEM CD) 240 MG 24 hr capsule Take 1 capsule (240 mg total) by mouth daily.  30 capsule  1  . dronedarone (MULTAQ) 400 MG tablet Take 1 tablet (400 mg total) by mouth 2 (two) times daily with a meal.  60 tablet  3  . LORazepam (ATIVAN) 0.5 MG tablet Take 1 tab by mouth as needed for anxiety      . metoprolol tartrate (LOPRESSOR) 25 MG tablet Take 25-50 mg by mouth daily as needed (onset of adfib). Take 1- 2 tablets by mouth if pt has onset of afib or if heartrate goes pass 100      . Multiple  Vitamin (MULTIVITAMIN WITH MINERALS) TABS Take 1 tablet by mouth daily.      . rivaroxaban (XARELTO) 20 MG TABS tablet Take 1 tablet (20 mg total) by mouth daily with supper.  30 tablet  2   No current facility-administered medications for this encounter.    Allergies  Allergen Reactions  . Eliquis [Apixaban] Hives, Itching and Dermatitis    Started     History   Social History  . Marital Status: Married    Spouse Name: [name]    Number of Children: [name]  . Years of Education: [name]   Occupational History  . Not on file.   Social History Main Topics  . Smoking  status: Former Smoker    Types: Cigarettes, Cigars  . Smokeless tobacco: Not on file  . Alcohol Use: Yes     Comment: wine at night to help relax  . Drug Use: No  . Sexual Activity: Not on file   Other Topics Concern  . Not on file   Social History Narrative  . No narrative on file    No family history on file. The patient does not have a history of early familial atrial fibrillation or other arrhythmias.  ROS- All systems are reviewed and negative except as per the HPI above.  Physical Exam: Filed Vitals:   03/22/14 1430  BP: 125/82  Pulse: 70  Height: 6\' 2"  (1.88 m)  Weight: 198 lb (89.812 kg)  SpO2: 97%    GEN- The patient is well appearing, alert and oriented x 3 today.   Head- normocephalic, atraumatic Eyes-  Sclera clear, conjunctiva pink Ears- hearing intact Oropharynx- clear Neck- supple, no JVP Lymph- no cervical lymphadenopathy Lungs- Clear to ausculation bilaterally, normal work of breathing Heart- Regular rate and rhythm, no murmurs, rubs or gallops, PMI not laterally displaced GI- soft, NT, ND, + BS Extremities- no clubbing, cyanosis, or edema MS- no significant deformity or atrophy Skin- no rash or lesion Psych- euthymic mood, full affect Neuro- strength and sensation are intact  EKG- Normal sinus rhythm at 70 bpm, PR 157ms,QRS int,157ms,QTC,432 ms.   Echo:Left ventricle: The cavity size was normal. Wall thickness was normal. Systolic function was normal. The estimated ejection fraction was in the range of 50% to 55%. Wall motion was normal; there were no regional wall motion abnormalities. Left ventricular diastolic function parameters were normal. - Aortic root: The aortic root was mildly dilated. - Left atrium: The atrium was mildly dilated. - Right ventricle: The cavity size was mildly dilated. - Right atrium: The atrium was mildly dilated.  Epic records are reviewed at length today  Assessment and Plan:  1. Atrial fibrillation The  patient has  Symptomatic  paroxysmal atrial fibrillation.  The patients CHAD2VASC score is 2.  he is  appropriately anticoagulated at this time. The patient is adequately rate controlled with prn metoprolol.. Antiarrhythmic therapy to dates has included multaq(x 2 days)  The patients left atrial size is 34mm.  Additional echo findings include  nml LVF, mild biatrial enlargement A long discussion with the patient was had today regarding therapeutic strategies.  Extensive discussion of lifestyle modification including decrease or avoid alcohol intake, continue current medications and continue current healthy lifestyle patterns was also discussed.  Presently, our recommendations include:  1. Decrease alcohol to two drinks a week 2.Continue regular exercise pattern, if not increasing to next level. 3. Continue Multaq 400 mg bid with supplemental metoprolol as needed 4.F/u Dr. Rayann Heman in one month for effectiveness of AAD  and if further treatment as needed.     Roderic Palau NP  Nurse Practitioner, [facility] 03/22/2014 2:31 PM

## 2014-03-22 NOTE — Patient Instructions (Signed)
Your physician recommends that you schedule a follow-up appointment in: DR ALLRED IN ONE MONTH-OFFICE TO CALL

## 2014-04-18 ENCOUNTER — Ambulatory Visit: Payer: Self-pay | Admitting: Cardiology

## 2014-04-18 ENCOUNTER — Other Ambulatory Visit: Payer: Self-pay

## 2014-04-18 MED ORDER — DILTIAZEM HCL ER COATED BEADS 240 MG PO CP24: 240.0000 mg | ORAL_CAPSULE | Freq: Every day | ORAL | Status: DC

## 2014-04-22 ENCOUNTER — Ambulatory Visit (INDEPENDENT_AMBULATORY_CARE_PROVIDER_SITE_OTHER): Payer: 59 | Admitting: Internal Medicine

## 2014-04-22 ENCOUNTER — Encounter: Payer: Self-pay | Admitting: *Deleted

## 2014-04-22 ENCOUNTER — Encounter: Payer: Self-pay | Admitting: Internal Medicine

## 2014-04-22 VITALS — BP 130/90 | HR 71 | Ht 74.0 in | Wt 197.0 lb

## 2014-04-22 DIAGNOSIS — I480 Paroxysmal atrial fibrillation: Secondary | ICD-10-CM

## 2014-04-22 DIAGNOSIS — I4891 Unspecified atrial fibrillation: Secondary | ICD-10-CM

## 2014-04-22 DIAGNOSIS — I10 Essential (primary) hypertension: Secondary | ICD-10-CM

## 2014-04-22 NOTE — Progress Notes (Signed)
Primary Care Physician: Vena Austria, MD Primary Cardiologist: Dr. Meda Coffee Primary Electrophysiologist:Dr. Anabel Lykins Referring Physician:Dr. Zaydan Papesh is a 48 y.o. male with a h/o paroxysmal  atrial fibrillation who presents for consultation in the [facility].  The patient was initially diagnosed with atrial fibrillation  in August after presenting  to Urgent Care with symptoms of  a fall and rt sided chest pain and afib was noted. However, pt feels like he has been having symptoms more than 18-24 months and misdiagnosed with anxiety disorder.  He has had several visits to the ER recently due to symptomatic afib with v rates in the 140's. Last visit was 10/20 at which time multaq was started. He has prn metoprolol to use for HR's over 100.He is currently on xatelto for  CHA2DS2-VASc Score and unadjusted Ischemic Stroke Rate (% per year) is equal to2.2 % stroke rate/year from a score of 2(? Cad, borderline positive stress test and HTN). Flecainide is bing avoided due to mildly abnormal stress test, but clinically pt is asymptomatic.  He returns today reporting some GI upset on Multaq for which he has a history of GI upset, better tolerance for the last 5 days.Marland Kitchen He continues to have breakthrough afib, mostly every evening but it is better rate controlled since being on multaq and is overall less symptomatic.He states he has cut back on alcohol consumption and does not have symptoms of sleep apnea.  Today, he denies symptoms of   chest pain, shortness of breath, orthopnea, PND, lower extremity edema, dizziness, presyncope, syncope, snoring, daytime somnolence, bleeding, or neurologic sequela. He does have some intermittent nausea/diarrhea. The patient is tolerating medications without difficulties and is otherwise without complaint today.    Atrial Fibrillation Risk Factors:  he does not have symptoms or diagnosis of sleep apnea.  he does not have  a history of rheumatic fever.  he does have a history of alcohol use.  he has a BMI of Body mass index is 25.28 kg/(m^2).Marland Kitchen Filed Weights   04/22/14 1349  Weight: 197 lb (89.359 kg)    LA size:41cm   Atrial Fibrillation Management history:  Previous antiarrhythmic drugs: currently multaq, nor previous AAD  Previous cardioversions: none  Previous ablations: none  CHADS2VASC score: 2  Anticoagulation history: Eliquis(rash), currently xarelto   Past Medical History  Diagnosis Date  . Hypertension   . Anxiety   . Psoriasis   . Atrial fibrillation    Past Surgical History  Procedure Laterality Date  . Hernia repair Right     with mesh  . Carpal tunnel release      Current Outpatient Prescriptions  Medication Sig Dispense Refill  . citalopram (CELEXA) 20 MG tablet Take 20 mg by mouth daily.    . clobetasol ointment (TEMOVATE) 4.09 % Apply 1 application topically 2 (two) times daily as needed (itching).    . Cyanocobalamin (B-12 PO) Take 500 mg by mouth daily.     Marland Kitchen diltiazem (CARDIZEM CD) 240 MG 24 hr capsule Take 1 capsule (240 mg total) by mouth daily. 30 capsule 3  . dronedarone (MULTAQ) 400 MG tablet Take 1 tablet (400 mg total) by mouth 2 (two) times daily with a meal. 60 tablet 3  . LORazepam (ATIVAN) 0.5 MG tablet Take 1 tablet by mouth daily as needed for anxiety    . metoprolol tartrate (LOPRESSOR) 25 MG tablet Take 25-50 mg by mouth daily as needed (onset of adfib). Take 1- 2 tablets  by mouth if pt has onset of afib or if heartrate goes pass 100    . Multiple Vitamin (MULTIVITAMIN WITH MINERALS) TABS Take 1 tablet by mouth daily.    . rivaroxaban (XARELTO) 20 MG TABS tablet Take 1 tablet (20 mg total) by mouth daily with supper. 30 tablet 2   No current facility-administered medications for this visit.    Allergies  Allergen Reactions  . Adhesive [Tape] Hives, Itching and Dermatitis    From electrodes, even the sensitive skin ones  . Eliquis [Apixaban]  Hives, Itching and Dermatitis    Has not confirmed yet per pt (04/22/14)  He believes it was the electrodes causing the issue because it was only underneath where electrodes were placed    History   Social History  . Marital Status: Married    Spouse Name: [name]    Number of Children: [name]  . Years of Education: [name]   Occupational History  . Not on file.   Social History Main Topics  . Smoking status: Former Smoker    Types: Cigarettes, Cigars  . Smokeless tobacco: Not on file  . Alcohol Use: Yes     Comment: wine at night to help relax  . Drug Use: No  . Sexual Activity: Not on file   Other Topics Concern  . Not on file   Social History Narrative    No family history on file. The patient does not have a history of early familial atrial fibrillation or other arrhythmias.  ROS- All systems are reviewed and negative except as per the HPI above.  Physical Exam: Filed Vitals:   04/22/14 1349  BP: 130/90  Pulse: 71  Height: 6\' 2"  (1.88 m)  Weight: 197 lb (89.359 kg)    GEN- The patient is well appearing, alert and oriented x 3 today.   Head- normocephalic, atraumatic Eyes-  Sclera clear, conjunctiva pink Ears- hearing intact Oropharynx- clear Neck- supple, no JVP Lymph- no cervical lymphadenopathy Lungs- Clear to ausculation bilaterally, normal work of breathing Heart- Regular rate and rhythm, no murmurs, rubs or gallops, PMI not laterally displaced GI- soft, NT, ND, + BS Extremities- no clubbing, cyanosis, or edema MS- no significant deformity or atrophy Skin- no rash or lesion Psych- euthymic mood, full affect Neuro- strength and sensation are intact  EKG- Normal sinus rhythm at 71 bpm, PR 16ms,QRS int 56ms,QTC,428 ms.   Echo:Left ventricle: The cavity size was normal. Wall thickness was normal. Systolic function was normal. The estimated ejection fraction was in the range of 50% to 55%. Wall motion was normal; there were no regional wall motion  abnormalities. Left ventricular diastolic function parameters were normal. - Aortic root: The aortic root was mildly dilated. - Left atrium: The atrium was mildly dilated. - Right ventricle: The cavity size was mildly dilated. - Right atrium: The atrium was mildly dilated.    Assessment and Plan:  1. Atrial fibrillation  The patient has  Symptomatic  paroxysmal atrial fibrillation, failing multaq..  The patients CHAD2VASC score is 2.  he is  appropriately anticoagulated at this time. The patient is adequately rate controlled with prn metoprolol..   Presently, our recommendations include:  Therapeutic strategies for afib including medicine and ablation were discussed in detail with the patient today. Risk, benefits, and alternatives to EP study and radiofrequency ablation for afib were also discussed in detail today. These risks include but are not limited to stroke, bleeding, vascular damage, tamponade, perforation, damage to the esophagus, lungs, and other structures,  pulmonary vein stenosis, worsening renal function, and death. The patient understands these risk and wishes to proceed.  We will therefore proceed with catheter ablation , as soon as the schedule allows. Reminded not to miss any xarelto prior to procedure and continue on multaq for now.     Roderic Palau NP  Nurse Practitioner, [facility] 04/22/2014 2:59 PM    I have seen, examined the patient, and reviewed the above assessment and plan.  Changes to above are made where necessary.  The patient has failed medical therapy with multaq.  Today we discussed tikosyn and ablation as options.  He is clear that he would like to proceed with catheter ablation.  We will schedule at the next available time.  Co Sign: Thompson Grayer, MD 04/22/2014 11:11 PM

## 2014-04-22 NOTE — Patient Instructions (Signed)
Your physician has recommended that you have an ablation. Catheter ablation is a medical procedure used to treat some cardiac arrhythmias (irregular heartbeats). During catheter ablation, a long, thin, flexible tube is put into a blood vessel in your groin (upper thigh), or neck. This tube is called an ablation catheter. It is then guided to your heart through the blood vessel. Radio frequency waves destroy small areas of heart tissue where abnormal heartbeats may cause an arrhythmia to start. Please see the instruction sheet given to you today.    See instruction sheet for ablation 

## 2014-04-23 ENCOUNTER — Telehealth: Payer: Self-pay | Admitting: Internal Medicine

## 2014-04-23 NOTE — Telephone Encounter (Signed)
New Msg   [place] from [place] pre service calling to see what type of procedure being completed for patient. She needs the code. Please call at [phone] ext [phone].

## 2014-04-24 NOTE — Telephone Encounter (Signed)
Called and spoke with [place].  She needed the CPT code for the ablation which [place] 9[address]56.

## 2014-04-24 NOTE — Telephone Encounter (Signed)
Follow up  [place] from Pre service calling back for the CPT code for the procedure; please call back and advise

## 2014-04-29 ENCOUNTER — Other Ambulatory Visit (INDEPENDENT_AMBULATORY_CARE_PROVIDER_SITE_OTHER): Payer: 59

## 2014-04-29 DIAGNOSIS — I480 Paroxysmal atrial fibrillation: Secondary | ICD-10-CM

## 2014-04-29 LAB — CBC WITH DIFFERENTIAL/PLATELET
BASOS ABS: 0 10*3/uL (ref 0.0–0.1)
Basophils Relative: 0.4 % (ref 0.0–3.0)
Eosinophils Absolute: 0.1 10*3/uL (ref 0.0–0.7)
Eosinophils Relative: 3.2 % (ref 0.0–5.0)
HEMATOCRIT: 41.3 % (ref 39.0–52.0)
HEMOGLOBIN: 14 g/dL (ref 13.0–17.0)
LYMPHS ABS: 1.3 10*3/uL (ref 0.7–4.0)
Lymphocytes Relative: 30.6 % (ref 12.0–46.0)
MCHC: 34 g/dL (ref 30.0–36.0)
MCV: 99 fl (ref 78.0–100.0)
MONOS PCT: 9 % (ref 3.0–12.0)
Monocytes Absolute: 0.4 10*3/uL (ref 0.1–1.0)
NEUTROS ABS: 2.4 10*3/uL (ref 1.4–7.7)
Neutrophils Relative %: 56.8 % (ref 43.0–77.0)
Platelets: 237 10*3/uL (ref 150.0–400.0)
RBC: 4.17 Mil/uL — ABNORMAL LOW (ref 4.22–5.81)
RDW: 14.2 % (ref 11.5–15.5)
WBC: 4.3 10*3/uL (ref 4.0–10.5)

## 2014-04-29 LAB — BASIC METABOLIC PANEL
BUN: 13 mg/dL (ref 6–23)
CHLORIDE: 101 meq/L (ref 96–112)
CO2: 28 mEq/L (ref 19–32)
Calcium: 8.4 mg/dL (ref 8.4–10.5)
Creatinine, Ser: 1 mg/dL (ref 0.4–1.5)
GFR: 88.01 mL/min (ref >60.00)
GLUCOSE: 88 mg/dL (ref 70–99)
POTASSIUM: 3.9 meq/L (ref 3.5–5.1)
SODIUM: 138 meq/L (ref 135–145)

## 2014-05-06 ENCOUNTER — Encounter (HOSPITAL_COMMUNITY): Payer: Self-pay | Admitting: *Deleted

## 2014-05-06 ENCOUNTER — Ambulatory Visit (HOSPITAL_COMMUNITY)
Admission: RE | Admit: 2014-05-06 | Discharge: 2014-05-06 | Disposition: A | Discharge status: Home / Self Care | Payer: 59 | Source: Ambulatory Visit | Attending: Internal Medicine | Admitting: Internal Medicine

## 2014-05-06 ENCOUNTER — Encounter (HOSPITAL_COMMUNITY)
Admission: RE | Disposition: A | Discharge status: Home / Self Care | Payer: Self-pay | Source: Ambulatory Visit | Attending: Internal Medicine

## 2014-05-06 DIAGNOSIS — I4891 Unspecified atrial fibrillation: Secondary | ICD-10-CM | POA: Diagnosis present

## 2014-05-06 DIAGNOSIS — I10 Essential (primary) hypertension: Secondary | ICD-10-CM | POA: Diagnosis present

## 2014-05-06 DIAGNOSIS — I480 Paroxysmal atrial fibrillation: Secondary | ICD-10-CM

## 2014-05-06 HISTORY — PX: TEE WITHOUT CARDIOVERSION: SHX5443

## 2014-05-06 SURGERY — ECHOCARDIOGRAM, TRANSESOPHAGEAL
Anesthesia: Moderate Sedation

## 2014-05-06 MED ORDER — BUTAMBEN-TETRACAINE-BENZOCAINE 2-2-14 % EX AERO
INHALATION_SPRAY | CUTANEOUS | Status: DC | PRN
  Administered 2014-05-06: 2 via TOPICAL

## 2014-05-06 MED ORDER — SODIUM CHLORIDE 0.9 % IV SOLN
INTRAVENOUS | Status: DC
  Administered 2014-05-06: 500 mL via INTRAVENOUS

## 2014-05-06 MED ORDER — LIDOCAINE VISCOUS 2 % MT SOLN
OROMUCOSAL | Status: AC
  Filled 2014-05-06: qty 15

## 2014-05-06 MED ORDER — MIDAZOLAM HCL 5 MG/ML IJ SOLN
INTRAMUSCULAR | Status: AC
  Filled 2014-05-06: qty 2

## 2014-05-06 MED ORDER — FENTANYL CITRATE 0.05 MG/ML IJ SOLN
INTRAMUSCULAR | Status: AC
  Filled 2014-05-06: qty 2

## 2014-05-06 MED ORDER — MIDAZOLAM HCL 10 MG/2ML IJ SOLN
INTRAMUSCULAR | Status: DC | PRN
  Administered 2014-05-06 (×2): 2 mg via INTRAVENOUS

## 2014-05-06 MED ORDER — FENTANYL CITRATE 0.05 MG/ML IJ SOLN
INTRAMUSCULAR | Status: DC | PRN
  Administered 2014-05-06 (×2): 25 ug via INTRAVENOUS

## 2014-05-06 MED ORDER — LIDOCAINE VISCOUS 2 % MT SOLN
OROMUCOSAL | Status: DC | PRN
  Administered 2014-05-06: 1 via OROMUCOSAL

## 2014-05-06 NOTE — Discharge Instructions (Signed)
Transesophageal Echocardiogram °Transesophageal echocardiography (TEE) is a special type of test that produces images of the heart by using sound waves (echocardiogram). This type of echocardiography can obtain better images of the heart than standard echocardiography. TEE is done by passing a flexible tube down the esophagus. The heart is located in front of the esophagus. Because the heart and esophagus are close to one another, your health care provider can take very clear, detailed pictures of the heart via ultrasound waves. °TEE may be done: °· If your health care provider needs more information based on standard echocardiography findings. °· If you had a stroke. This might have happened because a clot formed in your heart. TEE can visualize different areas of the heart and check for clots. °· To check valve anatomy and function. °· To check for infection on the inside of your heart (endocarditis). °· To evaluate the dividing wall (septum) of the heart and presence of a hole that did not close after birth (patent foramen ovale or atrial septal defect). °· To help diagnose a tear in the wall of the aorta (aortic dissection). °· During cardiac valve surgery. This allows the surgeon to assess the valve repair before closing the chest. °· During a variety of other cardiac procedures to guide positioning of catheters. °· Sometimes before a cardioversion, which is a shock to convert heart rhythm back to normal. °LET YOUR HEALTH CARE PROVIDER KNOW ABOUT:  °· Any allergies you have. °· All medicines you are taking, including vitamins, herbs, eye drops, creams, and over-the-counter medicines. °· Previous problems you or members of your family have had with the use of anesthetics. °· Any blood disorders you have. °· Previous surgeries you have had. °· Medical conditions you have. °· Swallowing difficulties. °· An esophageal obstruction. °RISKS AND COMPLICATIONS  °Generally, TEE is a safe procedure. However, as with any  procedure, complications can occur. Possible complications include an esophageal tear (rupture). °BEFORE THE PROCEDURE  °· Do not eat or drink for 6 hours before the procedure or as directed by your health care provider. °· Arrange for someone to drive you home after the procedure. Do not drive yourself home. During the procedure, you will be given medicines that can continue to make you feel drowsy and can impair your reflexes. °· An IV access tube will be started in the arm. °PROCEDURE  °· A medicine to help you relax (sedative) will be given through the IV access tube. °· A medicine may be sprayed or gargled to numb the back of the throat. °· Your blood pressure, heart rate, and breathing (vital signs) will be monitored during the procedure. °· The TEE probe is a long, flexible tube. The tip of the probe is placed into the back of the mouth, and you will be asked to swallow. This helps to pass the tip of the probe into the esophagus. Once the tip of the probe is in the correct area, your health care provider can take pictures of the heart. °· TEE is usually not a painful procedure. You may feel the probe press against the back of the throat. The probe does not enter the trachea and does not affect your breathing. °AFTER THE PROCEDURE  °· You will be in bed, resting, until you have fully returned to consciousness. °· When you first awaken, your throat may feel slightly sore and will probably still feel numb. This will improve slowly over time. °· You will not be allowed to eat or drink until it   is clear that the numbness has improved. °· Once you have been able to drink, urinate, and sit on the edge of the bed without feeling sick to your stomach (nausea) or dizzy, you may be cleared to go home. °· You should have a friend or family member with you for the next 24 hours after your procedure. °Document Released: 08/07/2002 Document Revised: 05/22/2013 Document Reviewed: 11/16/2012 °[employer]® Patient Information  ©2015 [employer]. This information is not intended to replace advice given to you by your health care provider. Make sure you discuss any questions you have with your health care provider. ° °

## 2014-05-06 NOTE — Progress Notes (Signed)
  Echocardiogram Echocardiogram Transesophageal has been performed.  Darlina Sicilian M 05/06/2014, 8:44 AM

## 2014-05-06 NOTE — CV Procedure (Signed)
    TRANSESOPHAGEAL ECHOCARDIOGRAM (TEE) NOTE  INDICATIONS: atrial fibrillation  PROCEDURE:   Informed consent was obtained prior to the procedure. The risks, benefits and alternatives for the procedure were discussed and the patient comprehended these risks.  Risks include, but are not limited to, cough, sore throat, vomiting, nausea, somnolence, esophageal and stomach trauma or perforation, bleeding, low blood pressure, aspiration, pneumonia, infection, trauma to the teeth and death.    After a procedural time-out, the patient was given 4 mg versed and 50 mcg fentanyl for moderate sedation.  The oropharynx was anesthetized 10 cc of topical 1% viscous lidocaine and 2 cetacaine sprays  The transesophageal probe was inserted in the esophagus and stomach without difficulty and multiple views were obtained.  The patient was kept under observation until the patient left the procedure room.  The patient left the procedure room in stable condition.   Agitated microbubble saline contrast was administered.  COMPLICATIONS:    There were no immediate complications.  Findings:  1. LEFT VENTRICLE: The left ventricular wall thickness is normal.  The left ventricular cavity is normal in size. Wall motion is normal.  LVEF is 55-60%.  2. RIGHT VENTRICLE:  The right ventricle is normal in structure and function without any thrombus or masses.    3. LEFT ATRIUM:  The left atrium is dilated in size without any thrombus or masses.  There is not spontaneous echo contrast ("smoke") in the left atrium consistent with a low flow state.  4. LEFT ATRIAL APPENDAGE:  The left atrial appendage is free of any thrombus or masses. The appendage has single lobes. Pulse doppler indicates moderate flow in the appendage.  5. ATRIAL SEPTUM:  The atrial septum appears intact and is free of thrombus and/or masses.  There is no evidence for interatrial shunting by color doppler and saline microbubble.  6. RIGHT ATRIUM:  The  right atrium is normal in size and function without any thrombus or masses.  7. MITRAL VALVE:  The mitral valve is normal in structure and function with trace to mild regurgitation.  There were no vegetations or stenosis.  8. AORTIC VALVE:  The aortic valve is trileaflet, normal in structure and function with no regurgitation.  There were no vegetations or stenosis  9. TRICUSPID VALVE:  The tricuspid valve is normal in structure and function with trivial regurgitation.  There were no vegetations or stenosis  10.  PULMONIC VALVE:  The pulmonic valve is normal in structure and function with trivial regurgitation.  There were no vegetations or stenosis.   11. AORTIC ARCH, ASCENDING AND DESCENDING AORTA:  There was no atherosclerosis of the ascending aorta, aortic arch, or proximal descending aorta.  12. PULMONARY VEINS: Anomalous pulmonary venous return was not noted.  13. PERICARDIUM: The pericardium appeared normal and non-thickened.  There is no pericardial effusion.  IMPRESSION:   1. No LAA thrombus 2. Negative for PFO 3. LVEF 55-60%  RECOMMENDATIONS:    1.  Proceed with a-fib ablation as planned tomorrow with Dr. Rayann Heman.  Time Spent Directly with the Patient:  30 minutes   Pixie Casino, MD, [facility] Attending Cardiologist [facility]  05/06/2014, 8:21 AM

## 2014-05-06 NOTE — H&P (Signed)
     INTERVAL PROCEDURE H&P  History and Physical Interval Note:  05/06/2014 7:54 AM  Evan [name] has presented today for their planned procedure. The various methods of treatment have been discussed with the patient and family. After consideration of risks, benefits and other options for treatment, the patient has consented to the procedure.  The patients' outpatient history has been reviewed, patient examined, and no change in status from most recent office note within the past 30 days. I have reviewed the patients' chart and labs and will proceed as planned. Questions were answered to the patient's satisfaction.   Pixie Casino, MD, [facility] Attending Cardiologist [facility]  [name] 05/06/2014, 7:54 AM

## 2014-05-07 ENCOUNTER — Ambulatory Visit (HOSPITAL_COMMUNITY): Payer: 59 | Admitting: Certified Registered Nurse Anesthetist

## 2014-05-07 ENCOUNTER — Encounter (HOSPITAL_COMMUNITY)
Admission: RE | Disposition: A | Discharge status: Home / Self Care | Payer: Self-pay | Source: Ambulatory Visit | Attending: Internal Medicine

## 2014-05-07 ENCOUNTER — Encounter (HOSPITAL_COMMUNITY): Payer: Self-pay | Admitting: Certified Registered Nurse Anesthetist

## 2014-05-07 ENCOUNTER — Ambulatory Visit (HOSPITAL_COMMUNITY)
Admission: RE | Admit: 2014-05-07 | Discharge: 2014-05-08 | Disposition: A | Discharge status: Home / Self Care | Payer: 59 | Source: Ambulatory Visit | Attending: Internal Medicine | Admitting: Internal Medicine

## 2014-05-07 DIAGNOSIS — I4891 Unspecified atrial fibrillation: Secondary | ICD-10-CM | POA: Diagnosis present

## 2014-05-07 DIAGNOSIS — I340 Nonrheumatic mitral (valve) insufficiency: Secondary | ICD-10-CM | POA: Insufficient documentation

## 2014-05-07 DIAGNOSIS — I10 Essential (primary) hypertension: Secondary | ICD-10-CM | POA: Insufficient documentation

## 2014-05-07 DIAGNOSIS — I480 Paroxysmal atrial fibrillation: Secondary | ICD-10-CM | POA: Diagnosis present

## 2014-05-07 DIAGNOSIS — F419 Anxiety disorder, unspecified: Secondary | ICD-10-CM | POA: Diagnosis not present

## 2014-05-07 DIAGNOSIS — R0781 Pleurodynia: Secondary | ICD-10-CM | POA: Insufficient documentation

## 2014-05-07 DIAGNOSIS — I4892 Unspecified atrial flutter: Secondary | ICD-10-CM

## 2014-05-07 DIAGNOSIS — L409 Psoriasis, unspecified: Secondary | ICD-10-CM | POA: Diagnosis not present

## 2014-05-07 DIAGNOSIS — R0602 Shortness of breath: Secondary | ICD-10-CM

## 2014-05-07 DIAGNOSIS — Z87891 Personal history of nicotine dependence: Secondary | ICD-10-CM | POA: Diagnosis not present

## 2014-05-07 DIAGNOSIS — I071 Rheumatic tricuspid insufficiency: Secondary | ICD-10-CM | POA: Diagnosis not present

## 2014-05-07 HISTORY — PX: ATRIAL FIBRILLATION ABLATION: SHX5456

## 2014-05-07 HISTORY — DX: Unspecified atrial flutter: I48.92

## 2014-05-07 LAB — POCT ACTIVATED CLOTTING TIME
ACTIVATED CLOTTING TIME: 140 s
Activated Clotting Time: 263 seconds
Activated Clotting Time: 288 seconds
Activated Clotting Time: 300 seconds

## 2014-05-07 LAB — MRSA PCR SCREENING: MRSA by PCR: NEGATIVE

## 2014-05-07 SURGERY — ATRIAL FIBRILLATION ABLATION
Anesthesia: General

## 2014-05-07 MED ORDER — BUPIVACAINE HCL (PF) 0.25 % IJ SOLN
INTRAMUSCULAR | Status: AC
  Filled 2014-05-07: qty 30

## 2014-05-07 MED ORDER — HEPARIN SODIUM (PORCINE) 1000 UNIT/ML IJ SOLN
INTRAMUSCULAR | Status: DC | PRN
  Administered 2014-05-07: 3000 [IU] via INTRAVENOUS
  Administered 2014-05-07: 12000 [IU] via INTRAVENOUS
  Administered 2014-05-07: 4000 [IU] via INTRAVENOUS

## 2014-05-07 MED ORDER — HYDROCODONE-ACETAMINOPHEN 5-325 MG PO TABS: 1.0000 | ORAL_TABLET | ORAL | Status: DC | PRN

## 2014-05-07 MED ORDER — ONDANSETRON HCL 4 MG/2ML IJ SOLN
INTRAMUSCULAR | Status: DC | PRN
  Administered 2014-05-07: 4 mg via INTRAVENOUS

## 2014-05-07 MED ORDER — ACETAMINOPHEN 325 MG PO TABS: 650.0000 mg | ORAL_TABLET | ORAL | Status: DC | PRN

## 2014-05-07 MED ORDER — LACTATED RINGERS IV SOLN: INTRAVENOUS | Status: DC | PRN

## 2014-05-07 MED ORDER — PROPOFOL 10 MG/ML IV BOLUS
INTRAVENOUS | Status: DC | PRN
  Administered 2014-05-07: 150 mg via INTRAVENOUS
  Administered 2014-05-07: 50 mg via INTRAVENOUS

## 2014-05-07 MED ORDER — LORAZEPAM 0.5 MG PO TABS: 0.5000 mg | ORAL_TABLET | Freq: Two times a day (BID) | ORAL | Status: DC | PRN

## 2014-05-07 MED ORDER — SODIUM CHLORIDE 0.9 % IV SOLN
INTRAVENOUS | Status: DC | PRN
  Administered 2014-05-07 (×2): via INTRAVENOUS

## 2014-05-07 MED ORDER — ONDANSETRON HCL 4 MG/2ML IJ SOLN: 4.0000 mg | Freq: Four times a day (QID) | INTRAMUSCULAR | Status: DC | PRN

## 2014-05-07 MED ORDER — MIDAZOLAM HCL 5 MG/5ML IJ SOLN
INTRAMUSCULAR | Status: DC | PRN
  Administered 2014-05-07: 2 mg via INTRAVENOUS

## 2014-05-07 MED ORDER — CITALOPRAM HYDROBROMIDE 20 MG PO TABS
20.0000 mg | ORAL_TABLET | Freq: Every day | ORAL | Status: DC
  Administered 2014-05-07 – 2014-05-08 (×2): 20 mg via ORAL
  Filled 2014-05-07 (×2): qty 1

## 2014-05-07 MED ORDER — PROTAMINE SULFATE 10 MG/ML IV SOLN
INTRAVENOUS | Status: DC | PRN
  Administered 2014-05-07 (×3): 10 mg via INTRAVENOUS

## 2014-05-07 MED ORDER — DOBUTAMINE IN D5W 4-5 MG/ML-% IV SOLN
INTRAVENOUS | Status: AC
  Filled 2014-05-07: qty 250

## 2014-05-07 MED ORDER — HEPARIN SODIUM (PORCINE) 1000 UNIT/ML IJ SOLN
INTRAMUSCULAR | Status: AC
  Filled 2014-05-07: qty 1

## 2014-05-07 MED ORDER — SODIUM CHLORIDE 0.9 % IV SOLN
250.0000 mL | INTRAVENOUS | Status: DC | PRN
  Administered 2014-05-08: 250 mL via INTRAVENOUS

## 2014-05-07 MED ORDER — RIVAROXABAN 20 MG PO TABS
20.0000 mg | ORAL_TABLET | Freq: Every day | ORAL | Status: DC
  Administered 2014-05-07: 20 mg via ORAL
  Filled 2014-05-07 (×2): qty 1

## 2014-05-07 MED ORDER — SODIUM CHLORIDE 0.9 % IJ SOLN: 3.0000 mL | INTRAMUSCULAR | Status: DC | PRN

## 2014-05-07 MED ORDER — DOBUTAMINE IN D5W 4-5 MG/ML-% IV SOLN
INTRAVENOUS | Status: DC | PRN
  Administered 2014-05-07: 10 ug/kg/min via INTRAVENOUS

## 2014-05-07 MED ORDER — FENTANYL CITRATE 0.05 MG/ML IJ SOLN
INTRAMUSCULAR | Status: DC | PRN
  Administered 2014-05-07 (×3): 25 ug via INTRAVENOUS

## 2014-05-07 MED ORDER — LIDOCAINE HCL (CARDIAC) 20 MG/ML IV SOLN
INTRAVENOUS | Status: DC | PRN
  Administered 2014-05-07: 70 mg via INTRAVENOUS

## 2014-05-07 MED ORDER — SODIUM CHLORIDE 0.9 % IJ SOLN
3.0000 mL | Freq: Two times a day (BID) | INTRAMUSCULAR | Status: DC
  Administered 2014-05-07 (×2): 3 mL via INTRAVENOUS

## 2014-05-07 NOTE — Progress Notes (Signed)
Noted with on and off  Hand tremors, could it be from the anesthesia  they used during the procedure, MD aware.

## 2014-05-07 NOTE — Anesthesia Preprocedure Evaluation (Addendum)
Anesthesia Evaluation  Patient identified by MRN, date of birth, ID band Patient awake    Reviewed: Allergy & Precautions, H&P , NPO status , Patient's Chart, lab work & pertinent test results  Airway Mallampati: II  TM Distance: >3 FB Neck ROM: Full    Dental  (+) Dental Advisory Given, Teeth Intact   Pulmonary shortness of breath and with exertion, former smoker,          Cardiovascular hypertension, Pt. on medications and Pt. on home beta blockers + DOE + dysrhythmias Atrial Fibrillation     Neuro/Psych PSYCHIATRIC DISORDERS Anxiety    GI/Hepatic negative GI ROS,   Endo/Other  negative endocrine ROS  Renal/GU negative Renal ROS     Musculoskeletal   Abdominal   Peds  Hematology   Anesthesia Other Findings   Reproductive/Obstetrics                            Anesthesia Physical Anesthesia Plan  ASA: III  Anesthesia Plan: General   Post-op Pain Management:    Induction: Intravenous  Airway Management Planned: LMA  Additional Equipment:   Intra-op Plan:   Post-operative Plan: Extubation in OR  Informed Consent: I have reviewed the patients History and Physical, chart, labs and discussed the procedure including the risks, benefits and alternatives for the proposed anesthesia with the patient or authorized representative who has indicated his/her understanding and acceptance.   Dental advisory given  Plan Discussed with: CRNA, Anesthesiologist and Surgeon  Anesthesia Plan Comments:         Anesthesia Quick Evaluation

## 2014-05-07 NOTE — Op Note (Signed)
SURGEON:  Thompson Grayer, MD  PREPROCEDURE DIAGNOSES: 1. Paroxysmal atrial fibrillation.  POSTPROCEDURE DIAGNOSES: 1. Paroxysmal  atrial fibrillation. 2. Right atrial flutter  PROCEDURES: 1. Comprehensive electrophysiologic study. 2. Coronary sinus pacing and recording. 3. Three-dimensional mapping of atrial fibrillation with additional mapping and ablation of a second discrete focus (atrial flutter) 4. Ablation of atrial fibrillation with additional mapping and ablation of a second discrete focus (atrial flutter) 5. Intracardiac echocardiography. 6. Transseptal puncture of an intact septum. 7. Rotational Angiography with processing at an independent workstation 8. Arrhythmia induction with pacing with dobutamine infusion  INTRODUCTION:  Evan [name] is a 48 y.o. male with a history of paroxysmal atrial fibrillation who now presents for EP study and radiofrequency ablation.  The patient reports initially being diagnosed with atrial fibrillation after presenting with symptomatic palpitations and fatgiue. The patient reports increasing frequency and duration of atrial arrhythmias since that time.  The patient has failed medical therapy with multaq.  The patient therefore presents today for catheter ablation.  DESCRIPTION OF PROCEDURE:  Informed written consent was obtained, and the patient was brought to the electrophysiology lab in a fasting state.  The patient was adequately sedated with intravenous medications as outlined in the anesthesia report.  The patient's left and right groins were prepped and draped in the usual sterile fashion by the EP lab staff.  Using a percutaneous Seldinger technique, two 7-French and one 11-French hemostasis sheaths were placed into the right common femoral vein.    3 Dimensional Rotational Angiography: A 5 french pigtail catheter was introduced through the right common femoral vein and advanced into the inferior venocava.  3 demential rotational angiography  was then performed by power injection of 100cc of nonionic contrast.  Reprocessing at an independent work station was then performed.   This demonstrated a moderate sized left atrium with 4 separate pulmonary veins which were also moderate in size.  There were no anomalous veins or significant abnormalities.  A 3 dimensional rendering of the left atrium was then merged using [employer] onto the [employer] system and registered with intracardiac echo (see below).  The pigtail catheter was then removed.  Catheter Placement:  A 7-French Biosense Webster Decapolar coronary sinus catheter was introduced through the right common femoral vein and advanced into the coronary sinus for recording and pacing from this location.  A quadrapolar catheter was introduced through the right common femoral vein and advanced into the right ventricle for recording and pacing.  This catheter was then pulled back to the His bundle location.  With catheter manipulation the patient developed typical appearing atrial flutter by ekg with atrial flutter CL 261msec.  CS activation was proximal to distal and suggestive of right atrial flutter.  Atrial flutter terminated before entrainment could be performed.  Initial Measurements: The patient presented to the electrophysiology lab in sinus rhythm.  The patients PR interval measured 197 msec with a QRS duration of 107 msec and a QT interval of 435 msec.  The AH interval measured 104 msec and the HV interval measured 48 msec.     Intracardiac Echocardiography: A 10-French Biosense Webster AcuNav intracardiac echocardiography catheter was introduced through the left common femoral vein and advanced into the right atrium. Intracardiac echocardiography was performed of the left atrium, and a three-dimensional anatomical rendering of the left atrium was performed using CARTO sound technology.  The patient was noted to have a moderate sized left atrium.  The interatrial septum  was prominent but not aneurysmal. All  4 pulmonary veins were visualized and noted to have separate ostia.  The pulmonary veins were moderate in size.  The left atrial appendage was visualized and did not reveal thrombus.   There was no evidence of pulmonary vein stenosis.   Transseptal Puncture: The middle right common femoral vein sheath was exchanged for an 8.5 [place] SL2 transseptal sheath and transseptal access was achieved in a standard fashion using a Brockenbrough needle under biplane fluoroscopy with intracardiac echocardiography confirmation of the transseptal puncture.  Once transseptal access had been achieved, heparin was administered intravenously and intra- arterially in order to maintain an ACT of greater than 300 seconds throughout the procedure.   3D Mapping and Ablation: The His bundle catheter was removed and in its place a 3.5 mm Biosense TransMontaigne ablation catheter was advanced into the right atrium.  The transseptal sheath was pulled back into the IVC over a guidewire.  The ablation catheter was advanced across the transseptal hole using the wire as a guide.  The transseptal sheath was then re-advanced over the guidewire into the left atrium.  A duodecapolar Biosense Webster circular mapping catheter was introduced through the transseptal sheath and positioned over the mouth of all 4 pulmonary veins.  Three-dimensional electroanatomical mapping was performed using CARTO technology.  This demonstrated electrical activity within all four pulmonary veins at baseline. The patient underwent successful sequential electrical isolation and anatomical encircling of all four pulmonary veins using radiofrequency current with a circular mapping catheter as a guide.   The ablation catheter was then pulled back into the right atrial and positioned along the cavo-tricuspid isthmus.  Mapping along the atrial side of the isthmus was performed.  This demonstrated a standard isthmus.   An extensive series of radiofrequency applications were then delivered along the isthmus.  The patient was observe without return of conduction through the isthmus.  Measurements Following Ablation: Following ablation, dobutamine was infused up to 10 mcg/kg/min with no inducible atrial fibrillation, atrial tachycardia, atrial flutter, or sustained PACs. In sinus rhythm with RR interval was 814 msec, with PR 171 msec, QRS 93 msec, and Qt 405 msec.  Following ablation the AH interval measured 72 msec with an HV interval of 47 msec. Ventricular pacing was performed, which revealed VA dissociation when pacing at 600 msec.  Rapid atrial pacing was performed, which revealed an AV Wenckebach cycle length of 330 msec.  Electroisolation was then again confirmed in all four pulmonary veins.  Intracardiac echocardiography was again performed, which revealed no pericardial effusion.  The procedure was therefore considered completed.  All catheters were removed, and the sheaths were aspirated and flushed.  The patient was transferred to the recovery area for sheath removal per protocol.  A limited bedside transthoracic echocardiogram revealed no pericardial effusion. EBL<13ml.  There were no early apparent complications.  CONCLUSIONS: 1. Sinus rhythm upon presentation.   2. Rotational Angiography reveals a moderate sized left atrium with four separate pulmonary veins without evidence of pulmonary vein stenosis. 3. Successful electrical isolation and anatomical encircling of all four pulmonary veins with radiofrequency current.    4. Cavo-tricuspid isthmus ablation was performed  5. No inducible arrhythmias following ablation both on and off of dobutamine  6. No early apparent complications.   Jeneen Rinks Stokes Rattigan,MD 10:43 AM 05/07/2014

## 2014-05-07 NOTE — Progress Notes (Signed)
Site:rt groin Site prior to removal: Level  0 Pressure applied for 15 minutes Manual:yes, venous Patient status during pull:stable Post pull site: Level 0  Post pull instructions given:yes Post pull pulses present:yes   Dressing applied:tegaderm Bedrest begins @1200  Comments:

## 2014-05-07 NOTE — Anesthesia Procedure Notes (Signed)
Procedure Name: LMA Insertion Date/Time: 05/07/2014 7:41 AM Performed by: Garrison Columbus T Pre-anesthesia Checklist: Patient identified, Emergency Drugs available, Suction available and Patient being monitored Patient Re-evaluated:Patient Re-evaluated prior to inductionOxygen Delivery Method: Circle system utilized Preoxygenation: Pre-oxygenation with 100% oxygen Intubation Type: IV induction Ventilation: Mask ventilation without difficulty LMA: LMA inserted LMA Size: 5.0 Number of attempts: 1 Placement Confirmation: positive ETCO2 and breath sounds checked- equal and bilateral Tube secured with: Tape Dental Injury: Teeth and Oropharynx as per pre-operative assessment

## 2014-05-07 NOTE — Progress Notes (Signed)
Relieved by Claudia Desanctis

## 2014-05-07 NOTE — Discharge Summary (Signed)
ELECTROPHYSIOLOGY PROCEDURE DISCHARGE SUMMARY    Patient ID: Evan [name],  MRN: [id number], DOB/AGE: [birth year] 48 y.o.  Admit date: 05/07/2014 Discharge date: 05/08/2014  Primary Care Physician: Vena Austria, MD Primary Cardiologist: Meda Coffee Electrophysiologist: Thompson Grayer, MD  Primary Discharge Diagnosis:  Paroxysmal atrial fibrillation status post ablation this admission  Secondary Discharge Diagnosis:  1.  Hypertension 2.  Anxiety 3.  Psoriasis  Procedures This Admission:  1.  Electrophysiology study and radiofrequency catheter ablation on 05-07-2014 by Dr Thompson Grayer.  This study demonstrated sinus rhythm upon presentation; rotational Angiography reveals a moderate sized left atrium with four separate pulmonary veins without evidence of pulmonary vein stenosis; successful electrical isolation and anatomical encircling of all four pulmonary veins with radiofrequency current; cavo-tricuspid isthmus ablation was performed; no inducible arrhythmias following ablation both on and off of dobutamine. There were no early apparent complications.   Brief HPI: Evan [name] is a 48 y.o. male with a history of paroxysmal atrial fibrillation.  They have failed medical therapy with Multaq. Risks, benefits, and alternatives to catheter ablation of atrial fibrillation were reviewed with the patient who wished to proceed.  The patient underwent TEE prior to the procedure which demonstrated normal LV function and no LAA thrombus.    Hospital Course:  The patient was admitted and underwent EPS/RFCA of atrial fibrillation with details as outlined above.  They were monitored on telemetry overnight which demonstrated frequent salvos of AF.  He also complained of typical post procedure pleuritic chest pain.  This was mild and improved overnight.  Groin was without complication on the day of discharge.  The patient was examined by Dr Rayann Heman and considered to be stable for discharge.  Wound  care and restrictions were reviewed with the patient.  The patient will be seen back by Dr Rayann Heman in 12 weeks for post ablation follow up.   Discharge Vitals: Blood pressure 137/77, pulse 116, temperature 99.7 F (37.6 C), temperature source Oral, resp. rate 24, height 6\' 2"  (1.88 m), weight 200 lb 9.9 oz (91 kg), SpO2 94 %.   Physical Exam: Filed Vitals:   05/08/14 0610 05/08/14 0612 05/08/14 0614 05/08/14 0700  BP:   143/93 137/77  Pulse: 87 129 127 116  Temp:    99.7 F (37.6 C)  TempSrc:      Resp: 17 19 30 24   Height:      Weight:      SpO2: 94% 96% 95% 94%    GEN- The patient is well appearing, alert and oriented x 3 today.   Head- normocephalic, atraumatic Eyes-  Sclera clear, conjunctiva pink Ears- hearing intact Oropharynx- clear Neck- supple, Lungs- Clear to ausculation bilaterally, normal work of breathing Heart- Regular rate and rhythm, no murmurs, rubs or gallops, PMI not laterally displaced GI- soft, NT, ND, + BS Extremities- no clubbing, cyanosis, or edema, groin is without hematoma/ bruit MS- no significant deformity or atrophy Skin- no rash or lesion Psych- euthymic mood, full affect Neuro- strength and sensation are intact   Labs:   Lab Results  Component Value Date   WBC 4.3 04/29/2014   HGB 14.0 04/29/2014   HCT 41.3 04/29/2014   MCV 99.0 04/29/2014   PLT 237.0 04/29/2014     Recent Labs Lab 05/08/14 0211  NA 139  K 3.7  CL 98  CO2 26  BUN 13  CREATININE 0.99  CALCIUM 8.0*  GLUCOSE 112*      Discharge Medications:    Medication List  TAKE these medications        B-12 PO  Take 500 mg by mouth daily.     citalopram 20 MG tablet  Commonly known as:  CELEXA  Take 20 mg by mouth daily.     clobetasol ointment 0.05 %  Commonly known as:  TEMOVATE  Apply 1 application topically 2 (two) times daily as needed (itching).     colchicine 0.6 MG tablet  Take 1 tablet (0.6 mg total) by mouth 2 (two) times daily.     diltiazem  240 MG 24 hr capsule  Commonly known as:  CARDIZEM CD  Take 1 capsule (240 mg total) by mouth daily.     dronedarone 400 MG tablet  Commonly known as:  MULTAQ  Take 1 tablet (400 mg total) by mouth 2 (two) times daily with a meal.     LORazepam 0.5 MG tablet  Commonly known as:  ATIVAN  Take 1 tablet by mouth daily as needed for anxiety     metoprolol tartrate 25 MG tablet  Commonly known as:  LOPRESSOR  Take 25-50 mg by mouth daily as needed (onset of adfib). Take 1- 2 tablets by mouth if pt has onset of afib or if heartrate goes pass 100     multivitamin with minerals Tabs tablet  Take 1 tablet by mouth daily.     pantoprazole 40 MG tablet  Commonly known as:  PROTONIX  Take 1 tablet (40 mg total) by mouth daily.     potassium gluconate 595 MG Tabs tablet  Take 595 mg by mouth daily.     rivaroxaban 20 MG Tabs tablet  Commonly known as:  XARELTO  Take 1 tablet (20 mg total) by mouth daily with supper.        Disposition:   Follow-up Information    Follow up with Dorothy Spark, MD On 06/03/2014.   Specialty:  Cardiology   Why:  11am   Contact information:   [place] [id number] [phone]       Follow up with Thompson Grayer, MD On 08/07/2014.   Specialty:  Cardiology   Why:  11:15 am   Contact information:   [place] [id number] [phone]       Duration of Discharge Encounter: Greater than 30 minutes including physician time.  Signed,  Thompson Grayer MD

## 2014-05-07 NOTE — H&P (View-Only) (Signed)
Primary Care Physician: Vena Austria, MD Primary Cardiologist: Dr. Meda Coffee Primary Electrophysiologist:Dr. Baran Kuhrt Referring Physician:Dr. Karman Evan [name] is a 48 y.o. male with a h/o paroxysmal  atrial fibrillation who presents for consultation in the [facility].  The patient was initially diagnosed with atrial fibrillation  in August after presenting  to Urgent Care with symptoms of  a fall and rt sided chest pain and afib was noted. However, pt feels like he has been having symptoms more than 18-24 months and misdiagnosed with anxiety disorder.  He has had several visits to the ER recently due to symptomatic afib with v rates in the 140's. Last visit was 10/20 at which time multaq was started. He has prn metoprolol to use for HR's over 100.He is currently on xatelto for  CHA2DS2-VASc Score and unadjusted Ischemic Stroke Rate (% per year) is equal to2.2 % stroke rate/year from a score of 2(? Cad, borderline positive stress test and HTN). Flecainide is bing avoided due to mildly abnormal stress test, but clinically pt is asymptomatic.  He returns today reporting some GI upset on Multaq for which he has a history of GI upset, better tolerance for the last 5 days.Marland Kitchen He continues to have breakthrough afib, mostly every evening but it is better rate controlled since being on multaq and is overall less symptomatic.He states he has cut back on alcohol consumption and does not have symptoms of sleep apnea.  Today, he denies symptoms of   chest pain, shortness of breath, orthopnea, PND, lower extremity edema, dizziness, presyncope, syncope, snoring, daytime somnolence, bleeding, or neurologic sequela. He does have some intermittent nausea/diarrhea. The patient is tolerating medications without difficulties and is otherwise without complaint today.    Atrial Fibrillation Risk Factors:  he does not have symptoms or diagnosis of sleep apnea.  he does not have  a history of rheumatic fever.  he does have a history of alcohol use.  he has a BMI of Body mass index is 25.28 kg/(m^2).Marland Kitchen Filed Weights   04/22/14 1349  Weight: 197 lb (89.359 kg)    LA size:41cm   Atrial Fibrillation Management history:  Previous antiarrhythmic drugs: currently multaq, nor previous AAD  Previous cardioversions: none  Previous ablations: none  CHADS2VASC score: 2  Anticoagulation history: Eliquis(rash), currently xarelto   Past Medical History  Diagnosis Date  . Hypertension   . Anxiety   . Psoriasis   . Atrial fibrillation    Past Surgical History  Procedure Laterality Date  . Hernia repair Right     with mesh  . Carpal tunnel release      Current Outpatient Prescriptions  Medication Sig Dispense Refill  . citalopram (CELEXA) 20 MG tablet Take 20 mg by mouth daily.    . clobetasol ointment (TEMOVATE) 0.34 % Apply 1 application topically 2 (two) times daily as needed (itching).    . Cyanocobalamin (B-12 PO) Take 500 mg by mouth daily.     Marland Kitchen diltiazem (CARDIZEM CD) 240 MG 24 hr capsule Take 1 capsule (240 mg total) by mouth daily. 30 capsule 3  . dronedarone (MULTAQ) 400 MG tablet Take 1 tablet (400 mg total) by mouth 2 (two) times daily with a meal. 60 tablet 3  . LORazepam (ATIVAN) 0.5 MG tablet Take 1 tablet by mouth daily as needed for anxiety    . metoprolol tartrate (LOPRESSOR) 25 MG tablet Take 25-50 mg by mouth daily as needed (onset of adfib). Take 1- 2 tablets  by mouth if pt has onset of afib or if heartrate goes pass 100    . Multiple Vitamin (MULTIVITAMIN WITH MINERALS) TABS Take 1 tablet by mouth daily.    . rivaroxaban (XARELTO) 20 MG TABS tablet Take 1 tablet (20 mg total) by mouth daily with supper. 30 tablet 2   No current facility-administered medications for this visit.    Allergies  Allergen Reactions  . Adhesive [Tape] Hives, Itching and Dermatitis    From electrodes, even the sensitive skin ones  . Eliquis [Apixaban]  Hives, Itching and Dermatitis    Has not confirmed yet per pt (04/22/14)  He believes it was the electrodes causing the issue because it was only underneath where electrodes were placed    History   Social History  . Marital Status: Married    Spouse Name: [name]    Number of Children: [name]  . Years of Education: [name]   Occupational History  . Not on file.   Social History Main Topics  . Smoking status: Former Smoker    Types: Cigarettes, Cigars  . Smokeless tobacco: Not on file  . Alcohol Use: Yes     Comment: wine at night to help relax  . Drug Use: No  . Sexual Activity: Not on file   Other Topics Concern  . Not on file   Social History Narrative    No family history on file. The patient does not have a history of early familial atrial fibrillation or other arrhythmias.  ROS- All systems are reviewed and negative except as per the HPI above.  Physical Exam: Filed Vitals:   04/22/14 1349  BP: 130/90  Pulse: 71  Height: 6\' 2"  (1.88 m)  Weight: 197 lb (89.359 kg)    GEN- The patient is well appearing, alert and oriented x 3 today.   Head- normocephalic, atraumatic Eyes-  Sclera clear, conjunctiva pink Ears- hearing intact Oropharynx- clear Neck- supple, no JVP Lymph- no cervical lymphadenopathy Lungs- Clear to ausculation bilaterally, normal work of breathing Heart- Regular rate and rhythm, no murmurs, rubs or gallops, PMI not laterally displaced GI- soft, NT, ND, + BS Extremities- no clubbing, cyanosis, or edema MS- no significant deformity or atrophy Skin- no rash or lesion Psych- euthymic mood, full affect Neuro- strength and sensation are intact  EKG- Normal sinus rhythm at 71 bpm, PR 164ms,QRS int 19ms,QTC,428 ms.   Echo:Left ventricle: The cavity size was normal. Wall thickness was normal. Systolic function was normal. The estimated ejection fraction was in the range of 50% to 55%. Wall motion was normal; there were no regional wall motion  abnormalities. Left ventricular diastolic function parameters were normal. - Aortic root: The aortic root was mildly dilated. - Left atrium: The atrium was mildly dilated. - Right ventricle: The cavity size was mildly dilated. - Right atrium: The atrium was mildly dilated.    Assessment and Plan:  1. Atrial fibrillation  The patient has  Symptomatic  paroxysmal atrial fibrillation, failing multaq..  The patients CHAD2VASC score is 2.  he is  appropriately anticoagulated at this time. The patient is adequately rate controlled with prn metoprolol..   Presently, our recommendations include:  Therapeutic strategies for afib including medicine and ablation were discussed in detail with the patient today. Risk, benefits, and alternatives to EP study and radiofrequency ablation for afib were also discussed in detail today. These risks include but are not limited to stroke, bleeding, vascular damage, tamponade, perforation, damage to the esophagus, lungs, and other structures,  pulmonary vein stenosis, worsening renal function, and death. The patient understands these risk and wishes to proceed.  We will therefore proceed with catheter ablation , as soon as the schedule allows. Reminded not to miss any xarelto prior to procedure and continue on multaq for now.     Roderic Palau NP  Nurse Practitioner, [facility] 04/22/2014 2:59 PM    I have seen, examined the patient, and reviewed the above assessment and plan.  Changes to above are made where necessary.  The patient has failed medical therapy with multaq.  Today we discussed tikosyn and ablation as options.  He is clear that he would like to proceed with catheter ablation.  We will schedule at the next available time.  Co Sign: Thompson Grayer, MD 04/22/2014 11:11 PM

## 2014-05-07 NOTE — Plan of Care (Signed)
Problem: Phase I Progression Outcomes Goal: Voiding-avoid urinary catheter unless indicated Outcome: Completed/Met Date Met:  05/07/14     

## 2014-05-07 NOTE — Interval H&P Note (Signed)
History and Physical Interval Note:  05/07/2014 7:09 AM  Evan [name]  has presented today for surgery, with the diagnosis of afib  The various methods of treatment have been discussed with the patient and family. After consideration of risks, benefits and other options for treatment, the patient has consented to  Procedure(s): ATRIAL FIBRILLATION ABLATION ([name]) as a surgical intervention .  The patient's history has been reviewed, patient examined, no change in status, stable for surgery.  I have reviewed the patient's chart and labs.  Questions were answered to the patient's satisfaction.     Thompson Grayer

## 2014-05-07 NOTE — Transfer of Care (Signed)
Immediate Anesthesia Transfer of Care Note  Patient: Evan [name]  Procedure(s) Performed: Procedure(s): ATRIAL FIBRILLATION ABLATION ([name])  Patient Location: Cath Lab  Anesthesia Type:General  Level of Consciousness: awake and alert   Airway & Oxygen Therapy: Patient Spontanous Breathing and Patient connected to face mask oxygen  Post-op Assessment: Report given to PACU RN, Post -op Vital signs reviewed and stable and Patient moving all extremities X 4  Post vital signs: Reviewed and stable  Complications: No apparent anesthesia complications

## 2014-05-07 NOTE — Anesthesia Postprocedure Evaluation (Signed)
  Anesthesia Post-op Note  Patient: Evan [name]  Procedure(s) Performed: Procedure(s): ATRIAL FIBRILLATION ABLATION ([name])  Patient Location: PACU  Anesthesia Type:MAC  Level of Consciousness: awake  Airway and Oxygen Therapy: Patient Spontanous Breathing  Post-op Pain: mild  Post-op Assessment: Post-op Vital signs reviewed  Post-op Vital Signs: Reviewed  Last Vitals:  Filed Vitals:   05/07/14 1500  BP: 150/90  Pulse: 90  Temp:   Resp: 25    Complications: No apparent anesthesia complications

## 2014-05-07 NOTE — Plan of Care (Signed)
Problem: Phase I Progression Outcomes Goal: Hemostasis of puncture sites Outcome: Completed/Met Date Met:  05/07/14

## 2014-05-08 ENCOUNTER — Ambulatory Visit (HOSPITAL_COMMUNITY): Payer: 59

## 2014-05-08 DIAGNOSIS — I480 Paroxysmal atrial fibrillation: Secondary | ICD-10-CM | POA: Diagnosis not present

## 2014-05-08 DIAGNOSIS — F419 Anxiety disorder, unspecified: Secondary | ICD-10-CM | POA: Diagnosis not present

## 2014-05-08 DIAGNOSIS — L409 Psoriasis, unspecified: Secondary | ICD-10-CM | POA: Diagnosis not present

## 2014-05-08 DIAGNOSIS — I10 Essential (primary) hypertension: Secondary | ICD-10-CM | POA: Diagnosis not present

## 2014-05-08 DIAGNOSIS — I059 Rheumatic mitral valve disease, unspecified: Secondary | ICD-10-CM

## 2014-05-08 LAB — BASIC METABOLIC PANEL
ANION GAP: 15 (ref 5–15)
BUN: 13 mg/dL (ref 6–23)
CHLORIDE: 98 meq/L (ref 96–112)
CO2: 26 mEq/L (ref 19–32)
Calcium: 8 mg/dL — ABNORMAL LOW (ref 8.4–10.5)
Creatinine, Ser: 0.99 mg/dL (ref 0.50–1.35)
GFR calc Af Amer: >90 mL/min (ref >90)
GFR calc non Af Amer: >90 mL/min (ref >90)
GLUCOSE: 112 mg/dL — AB (ref 70–99)
Potassium: 3.7 mEq/L (ref 3.7–5.3)
SODIUM: 139 meq/L (ref 137–147)

## 2014-05-08 MED ORDER — PANTOPRAZOLE SODIUM 40 MG PO TBEC: 40.0000 mg | DELAYED_RELEASE_TABLET | Freq: Every day | ORAL | Status: DC

## 2014-05-08 MED ORDER — DILTIAZEM LOAD VIA INFUSION
10.0000 mg | Freq: Once | INTRAVENOUS | Status: AC
  Administered 2014-05-08: 10 mg via INTRAVENOUS
  Filled 2014-05-08: qty 10

## 2014-05-08 MED ORDER — COLCHICINE 0.6 MG PO TABS: 0.6000 mg | ORAL_TABLET | Freq: Every day | ORAL | Status: DC

## 2014-05-08 MED ORDER — COLCHICINE 0.6 MG PO TABS
0.6000 mg | ORAL_TABLET | Freq: Two times a day (BID) | ORAL | Status: DC
Start: 2014-05-08 — End: 2014-05-08

## 2014-05-08 MED ORDER — DRONEDARONE HCL 400 MG PO TABS: 400.0000 mg | ORAL_TABLET | Freq: Two times a day (BID) | ORAL | Status: DC

## 2014-05-08 MED ORDER — DILTIAZEM HCL ER COATED BEADS 240 MG PO CP24
240.0000 mg | ORAL_CAPSULE | Freq: Every day | ORAL | Status: DC
  Administered 2014-05-08: 240 mg via ORAL
  Filled 2014-05-08: qty 1

## 2014-05-08 MED ORDER — COLCHICINE 0.6 MG PO TABS
0.6000 mg | ORAL_TABLET | Freq: Every day | ORAL | Status: DC
  Administered 2014-05-08: 0.6 mg via ORAL
  Filled 2014-05-08: qty 1

## 2014-05-08 MED ORDER — DRONEDARONE HCL 400 MG PO TABS
400.0000 mg | ORAL_TABLET | Freq: Two times a day (BID) | ORAL | Status: DC
  Administered 2014-05-08: 400 mg via ORAL
  Filled 2014-05-08 (×4): qty 1

## 2014-05-08 MED ORDER — DEXTROSE 5 % IV SOLN
5.0000 mg/h | INTRAVENOUS | Status: DC
  Administered 2014-05-08: 5 mg/h via INTRAVENOUS
  Filled 2014-05-08: qty 100

## 2014-05-08 NOTE — Plan of Care (Signed)
Problem: Discharge Progression Outcomes Goal: Discharge plan in place and appropriate Outcome: Completed/Met Date Met:  05/08/14     

## 2014-05-08 NOTE — Plan of Care (Signed)
Problem: Discharge Progression Outcomes Goal: Activity appropriate for discharge plan Outcome: Completed/Met Date Met:  05/08/14

## 2014-05-08 NOTE — Plan of Care (Signed)
Problem: Discharge Progression Outcomes Goal: Sinus rate/atrial ECG rhythm with HR < 100/min Outcome: Completed/Met Date Met:  05/08/14

## 2014-05-08 NOTE — Progress Notes (Signed)
Pt c/o chest soreness at end of inspiration intermittently and jaw tenderness. Pt HRincreased 120-140's. Turner on call for Cardiology paged. Awaiting call back.

## 2014-05-08 NOTE — Plan of Care (Signed)
Problem: Discharge Progression Outcomes Goal: Complications resolved/controlled Outcome: Not Applicable Date Met:  03/83/33

## 2014-05-08 NOTE — Discharge Instructions (Signed)
No driving for 5 days. No lifting over 5 lbs for 1 week. No sexual activity for 1 week. You may return to work in 7 days. Keep procedure site clean & dry. If you notice increased pain, swelling, bleeding or pus, call/return!  You may shower, but no soaking baths/hot tubs/pools for 1 week.  ° ° °

## 2014-05-08 NOTE — Progress Notes (Signed)
Pt HR bouncing 80-90's to 130-140's. Pt states he feels his chest fluttering. MD aware and orders received. EKG obtained confirmed afib RVR. Will continue to monitor.

## 2014-05-08 NOTE — Progress Notes (Signed)
Echocardiogram 2D Echocardiogram has been performed.  Evan [name] 05/08/2014, 6:43 AM

## 2014-05-08 NOTE — Progress Notes (Signed)
Discharged home by wheelchair accompanied by his father, stable, discharge instructions given to pt. Belongings with pt.

## 2014-05-08 NOTE — Plan of Care (Signed)
Problem: Discharge Progression Outcomes Goal: Tolerating diet Outcome: Completed/Met Date Met:  05/08/14

## 2014-05-08 NOTE — Plan of Care (Signed)
Problem: Discharge Progression Outcomes Goal: Pain controlled with appropriate interventions Outcome: Completed/Met Date Met:  05/08/14     

## 2014-05-08 NOTE — Progress Notes (Signed)
Echo reviewed, results below.   Study Conclusions - Left ventricle: The cavity size was normal. Wall thickness was normal. Systolic function was normal. The estimated ejection fraction was in the range of 55% to 60%. Wall motion was normal; there were no regional wall motion abnormalities. - Aortic valve: Structurally normal valve. Transvalvular velocity was within the normal range. There was no stenosis. There was no regurgitation. - Aorta: The aorta was normal, not dilated, and non-diseased. - Ascending aorta: The ascending aorta was normal in size. - Mitral valve: Structurally normal valve. There was mild regurgitation. - Right ventricle: The cavity size was normal. Wall thickness was normal. Systolic function was normal. Systolic pressure was within the normal range. - Right atrium: The atrium was normal in size. - Atrial septum: No defect or patent foramen ovale was identified. - Tricuspid valve: Structurally normal valve. - Pulmonic valve: There was no regurgitation. - Pulmonary arteries: PA peak pressure: 28 mm Hg (S). - Inferior vena cava: The vessel was normal in size. The respirophasic diameter changes were in the normal range (>= 50%), consistent with normal central venous pressure. - Pericardium, extracardiac: There was no pericardial effusion. Impressions: - Mild mitral and tricuspid regurgitation, otherwise normal study. No pericardial effusion seen.  OK for D/C, f/u arranged.

## 2014-05-08 NOTE — Plan of Care (Signed)
Problem: Discharge Progression Outcomes Goal: Barriers To Progression Addressed/Resolved Outcome: Not Applicable Date Met:  77/03/40

## 2014-05-08 NOTE — Plan of Care (Signed)
Problem: Discharge Progression Outcomes Goal: Hemodynamically stable Outcome: Completed/Met Date Met:  05/08/14

## 2014-05-08 NOTE — Plan of Care (Signed)
Problem: Discharge Progression Outcomes Goal: INR monitor plan established Outcome: Completed/Met Date Met:  05/08/14

## 2014-05-09 ENCOUNTER — Encounter (HOSPITAL_COMMUNITY): Payer: Self-pay | Admitting: Internal Medicine

## 2014-05-16 ENCOUNTER — Telehealth: Payer: Self-pay | Admitting: Cardiology

## 2014-05-16 NOTE — Telephone Encounter (Signed)
Agree to holding Colcrys.  Follow-up with primary care if not improved.

## 2014-05-16 NOTE — Telephone Encounter (Signed)
New Prob   Pt was started on two different types of medications last week and calling to make sure he is not having a reaction. Please call.

## 2014-05-16 NOTE — Telephone Encounter (Signed)
Returned call to patient he stated he started having diarrhea yesterday.Stated he is still having diarrhea today.Stated he was discharged from hospital and wanted to know if colchrys or pantoprazole would be causing.Advised to hold colchrys.Message sent to [facility] and his nurse Claiborne Billings.

## 2014-06-02 ENCOUNTER — Encounter (HOSPITAL_COMMUNITY): Payer: Self-pay | Admitting: *Deleted

## 2014-06-03 ENCOUNTER — Encounter: Payer: Self-pay | Admitting: Cardiology

## 2014-06-03 ENCOUNTER — Ambulatory Visit (INDEPENDENT_AMBULATORY_CARE_PROVIDER_SITE_OTHER): Payer: 59 | Admitting: Cardiology

## 2014-06-03 VITALS — BP 120/88 | HR 71 | Ht 74.0 in | Wt 199.1 lb

## 2014-06-03 DIAGNOSIS — I10 Essential (primary) hypertension: Secondary | ICD-10-CM

## 2014-06-03 DIAGNOSIS — I4891 Unspecified atrial fibrillation: Secondary | ICD-10-CM

## 2014-06-03 DIAGNOSIS — Z7901 Long term (current) use of anticoagulants: Secondary | ICD-10-CM

## 2014-06-03 MED ORDER — HYDROCHLOROTHIAZIDE 25 MG PO TABS: 25.0000 mg | ORAL_TABLET | Freq: Every day | ORAL | Status: DC

## 2014-06-03 NOTE — Progress Notes (Signed)
Patient ID: Evan [name], male   DOB: Jul 10, 1965, 49 y.o.   MRN: [id number]     Patient Name: Evan [name] Date of Encounter: 06/03/2014  Primary Care Provider:  Vena Austria, MD Primary Cardiologist: Evan [name]   Problem List   Past Medical History  Diagnosis Date  . Hypertension   . Anxiety   . Psoriasis   . Atrial fibrillation     a. PVI 04/2014 Dr Evan [name] b. failed medical therapy with Multaq  . Atrial flutter     a. CTI ablation 04/2014 Dr Evan [name]   Past Surgical History  Procedure Laterality Date  . Hernia repair Right     with mesh  . Carpal tunnel release    . Tee without cardioversion [name] 05/06/2014    Procedure: TRANSESOPHAGEAL ECHOCARDIOGRAM (TEE);  Surgeon: Evan Casino, MD;  Location: [facility] ENDOSCOPY;  Service: Cardiovascular;  Laterality: [name];  . Atrial fibrillation ablation [name] 05/07/2014    PVI and CTI by Dr Evan [name]    Allergies  Allergies  Allergen Reactions  . Adhesive [Tape] Hives, Itching and Dermatitis    From electrodes, even the sensitive skin ones  . Eliquis [Apixaban] Hives, Itching and Dermatitis    Has not confirmed yet per pt (04/22/14)  He believes it was the electrodes causing the issue because it was only underneath where electrodes were placed    HPI  49 year old gentleman with a history of alcohol abuse and with paroxysmal atrial fibrillation who failed medical therapy with Multaq. The patient underwent a TEE prior to the radiofrequency ablation which demonstrated normal LV function and no LAA thrombus. Ablation was performed on 05/07/2014. The patient has been doing okay, first couple weeks after discharge he could feel palpitations almost every night now only about every other night. There is no associated dizziness or syncope. He is compliant with all of his medicines. He brings blood pressure log from home and it shows multiple readings in 140-150 mmHg range. He denies any chest pain. The patient has a chronic history  of lower extremity edema worse on the right and left. He has a history of injury to his right leg with resulting lymphedema.   Home Medications  Prior to Admission medications   Medication Sig Start Date End Date Taking? Authorizing Provider  citalopram (CELEXA) 20 MG tablet Take 20 mg by mouth daily. 10/08/13  Yes Historical Provider, MD  clobetasol ointment (TEMOVATE) 5.78 % Apply 1 application topically 2 (two) times daily as needed (itching).   Yes Historical Provider, MD  Cyanocobalamin (B-12 PO) Take 500 mg by mouth daily.    Yes Historical Provider, MD  diltiazem (CARDIZEM CD) 240 MG 24 hr capsule Take 1 capsule (240 mg total) by mouth daily. 04/18/14  Yes Evan Grayer, MD  LORazepam (ATIVAN) 0.5 MG tablet Take 1 tablet by mouth daily as needed for anxiety   Yes Historical Provider, MD  metoprolol tartrate (LOPRESSOR) 25 MG tablet Take 25-50 mg by mouth daily as needed (onset of adfib). Take 1- 2 tablets by mouth if pt has onset of afib or if heartrate goes pass 100   Yes Historical Provider, MD  Multiple Vitamin (MULTIVITAMIN WITH MINERALS) TABS Take 1 tablet by mouth daily.   Yes Historical Provider, MD  pantoprazole (PROTONIX) 40 MG tablet Take 1 tablet (40 mg total) by mouth daily. 05/08/14  Yes Evan Grayer, MD  potassium gluconate 595 MG TABS tablet Take 595 mg by mouth daily.   Yes Historical Provider, MD  rivaroxaban (XARELTO) 20 MG TABS tablet Take 1 tablet (20 mg total) by mouth daily with supper. 03/13/14  Yes Evan Spark, MD  hydrochlorothiazide (HYDRODIURIL) 25 MG tablet Take 1 tablet (25 mg total) by mouth daily. 06/03/14   Evan Spark, MD    Family History  Family History  Problem Relation Age of Onset  . Hypertension Mother   . Heart failure Mother   . Diabetes Mother   . Heart attack Brother     Social History  History   Social History  . Marital Status: Legally Separated    Spouse Name: [name]    Number of Children: [name]  . Years of Education: [name]    Occupational History  . Not on file.   Social History Main Topics  . Smoking status: Former Smoker    Types: Cigarettes, Cigars  . Smokeless tobacco: Not on file  . Alcohol Use: Yes     Comment: wine at night to help relax  . Drug Use: No  . Sexual Activity: Not on file   Other Topics Concern  . Not on file   Social History Narrative     Review of Systems, as per HPI, otherwise negative General:  No chills, fever, night sweats or weight changes.  Cardiovascular:  No chest pain, dyspnea on exertion, edema, orthopnea, palpitations, paroxysmal nocturnal dyspnea. Dermatological: No rash, lesions/masses Respiratory: No cough, dyspnea Urologic: No hematuria, dysuria Abdominal:   No nausea, vomiting, diarrhea, bright red blood per rectum, melena, or hematemesis Neurologic:  No visual changes, wkns, changes in mental status. All other systems reviewed and are otherwise negative except as noted above.  Physical Exam  Blood pressure 120/88, pulse 71, height 6\' 2"  (1.88 m), weight 199 lb 1.9 oz (90.32 kg).  General: Pleasant, NAD Psych: Normal affect. Neuro: Alert and oriented X 3. Moves all extremities spontaneously. HEENT: Normal  Neck: Supple without bruits or JVD. Lungs:  Resp regular and unlabored, CTA. Heart: RRR no s3, s4, or murmurs. Abdomen: Soft, non-tender, non-distended, BS + x 4.  Extremities: No clubbing, cyanosis, mild pitting edema more right than left. Chronic skin lesions on both of his lower extremities consistent his psoriasis.Marland Kitchen DP/PT/Radials 2+ and equal bilaterally.  Labs:  No results for input(s): CKTOTAL, CKMB, TROPONINI in the last 72 hours. Lab Results  Component Value Date   WBC 4.3 04/29/2014   HGB 14.0 04/29/2014   HCT 41.3 04/29/2014   MCV 99.0 04/29/2014   PLT 237.0 04/29/2014    No results found for: DDIMER Invalid input(s): POCBNP    Component Value Date/Time   NA 139 05/08/2014 0211   K 3.7 05/08/2014 0211   CL 98 05/08/2014 0211    CO2 26 05/08/2014 0211   GLUCOSE 112* 05/08/2014 0211   BUN 13 05/08/2014 0211   CREATININE 0.99 05/08/2014 0211   CALCIUM 8.0* 05/08/2014 0211   PROT 7.8 02/16/2014 2114   ALBUMIN 4.0 02/16/2014 2114   AST 30 02/16/2014 2114   ALT 29 02/16/2014 2114   ALKPHOS 92 02/16/2014 2114   BILITOT 0.6 02/16/2014 2114   GFRNONAA >90 05/08/2014 0211   GFRAA >90 05/08/2014 0211   No results found for: CHOL, HDL, LDLCALC, TRIG  Accessory Clinical Findings  Echocardiogram - 05/08/2014 Study Conclusions  - Left ventricle: The cavity size was normal. Wall thickness was normal. Systolic function was normal. The estimated ejection fraction was in the range of 55% to 60%. Wall motion was normal; there were no regional wall motion abnormalities. -  Aortic valve: Structurally normal valve. Transvalvular velocity was within the normal range. There was no stenosis. There was no regurgitation. - Aorta: The aorta was normal, not dilated, and non-diseased. - Ascending aorta: The ascending aorta was normal in size. - Mitral valve: Structurally normal valve. There was mild regurgitation. - Right ventricle: The cavity size was normal. Wall thickness was normal. Systolic function was normal. Systolic pressure was within the normal range. - Right atrium: The atrium was normal in size. - Atrial septum: No defect or patent foramen ovale was identified. - Tricuspid valve: Structurally normal valve. - Pulmonic valve: There was no regurgitation. - Pulmonary arteries: PA peak pressure: 28 mm Hg (S). - Inferior Evan cava: The vessel was normal in size. The respirophasic diameter changes were in the normal range (>= 50%), consistent with normal central venous pressure. - Pericardium, extracardiac: There was no pericardial effusion.  Impressions:  - Mild mitral and tricuspid regurgitation, otherwise normal study. No pericardial effusion seen.  ECG - normal sinus rhythm, 71 bpm, LVH.  This is new when compared to the prior EKG that showed atrial fibrillation with RVR    Assessment & Plan  49 year old male with prior history of alcohol abuse  1. Paroxysmal atrial fibrillation that failed therapy with dramatic on status post radiofrequency ablation in December 2015. Patient is on Xarelto and diltiazem. He is compliant with his medicines and continues to have episodes of atrial fibrillation he is explained that this is normal in the first 3 months after the ablation. His echocardiogram showed normal systolic function and normal size of the left atrium.  2. Hypertension - we will add hydrochlorthiazide 25 mg daily to his regimen.  Follow up in 3 months.    Evan Spark, MD, [facility] 06/03/2014, 2:33 PM

## 2014-06-03 NOTE — Patient Instructions (Signed)
Your physician has recommended you make the following change in your medication:   START TAKING HYDROCHLOROTHIAZIDE 25 MG ONCE DAILY    Your physician recommends that you schedule a follow-up appointment in: [place]

## 2014-06-21 ENCOUNTER — Telehealth: Payer: Self-pay | Admitting: Nurse Practitioner

## 2014-06-21 MED ORDER — RIVAROXABAN 20 MG PO TABS: 20.0000 mg | ORAL_TABLET | Freq: Every day | ORAL | Status: DC

## 2014-06-21 NOTE — Telephone Encounter (Signed)
Phone call this afternoon. Only has 2 more pills of Xarelto. Needs refill. Seen earlier this month.  Xarelto 20 mg daily #30 x 3 refills to [facility] on [address] sent in.  Patient appreciative of assistance.   Burtis Junes, RN, [place] [address] [place], [place]  [address] [phone]

## 2014-06-24 ENCOUNTER — Telehealth: Payer: Self-pay | Admitting: Cardiology

## 2014-06-24 NOTE — Telephone Encounter (Signed)
lmtcb

## 2014-06-24 NOTE — Telephone Encounter (Signed)
Pt has had swelling in ankle and r leg for 3-4 days, can he double up on fluid pill??

## 2014-06-24 NOTE — Telephone Encounter (Signed)
I spoke with the patient. He reports that he saw Dr. Meda Coffee early January. At that time, she started HCTZ 25 mg once daily. He has done well until the last several days. He is reporting some increased swelling to his right ankle and leg. He states that he has psoriasis and that his right leg is a little red in nature, but no significant change in temperature from the left side. He has not traveled any distance lately. He feels that his energy level has been down until about the last week, she he has been fairly inactive until this past week. He reports no other symptoms at this time. He typically will take his BP only when he feels that he is in a-fib. Pressures have been 660'A-004'H systolic. HR will be 140-150's when he is in a-fib. He will take a metoprolol if he notices this and his rates are typically back down within 30-40 minutes. He feels he is having less episodes of a-fib. He last had this on 06/21/14. His most recent BMP was 05/08/14- K+/ BUN/ Creatinine- 3.7/13/0.99. I have advised him that he should be able to go ahead and take an extra 25 mg of HCTZ x 3 days, then resume his regular 25 mg daily dose. He will call back on Thursday if his symptoms are no better. I advised the patient that I would forward to Dr. Meda Coffee for review and we will call him back with any further recommendations. He voices understanding.

## 2014-06-24 NOTE — Telephone Encounter (Signed)
Follow Up ° °Pt returned call//  °

## 2014-06-24 NOTE — Telephone Encounter (Signed)
I agree, thank you. Please call him on Thursday and update me. KN

## 2014-06-27 NOTE — Telephone Encounter (Signed)
Called pt to check on him and ask how he's feeling per Dr Meda Coffee.  Pt reports he is feeling much better since he talked with our office on 1/25.  Pt states the swelling in his right ankle and leg is now normal in appearance.  Pt reports no cp, sob, or any other cardiac complaints at this time.  Informed the pt that I will update Dr Meda Coffee on his current state.  Pt very gracious for all the assistance and check-up provided.

## 2014-07-02 ENCOUNTER — Telehealth: Payer: Self-pay | Admitting: Cardiology

## 2014-07-02 NOTE — Telephone Encounter (Signed)
New Msg      Pt c/o swelling: STAT is pt has developed SOB within 24 hours  1. How long have you been experiencing swelling? Since Thurs of last week because he stopped taking two fluid pills a day  2. Where is the swelling located? Right leg at the ankle area  3.  Are you currently taking a "fluid pill"? yes  4.  Are you currently SOB? no  5.  Have you traveled recently? No  What should he do about swelling?  Pt would also like to know if he can tattoo since he is on blood thinners?  Please return pt call.

## 2014-07-02 NOTE — Telephone Encounter (Signed)
Pt states he's been experiencing mild LEE since last conversation 1/25. Pt states he is not sob, with no cardiac complaints at this time.  Pt states that he is not taking his diuretic every day.  Pt states that he has not been watching his sodium intake like he should.  Pt states he does not weigh himself daily.  Pt states he has not been taking his BP meds like he should.  Advised the pt to decrease his sodium intake to a healthy minimum.  Pt states he's actually starting to feel better since the ablation. Advised the pt to weight himself daily, every morning and log this information to our office in one week along with his BP readings.  Pt also asking if he can have a tattoo while being on blood thinners.  Informed the pt that per our PharmD, most tattoo facilities do not allow clients to receive tattoos on blood thinners.  Pt verbalized understanding of instructions given and agrees with this plan.

## 2014-07-09 ENCOUNTER — Telehealth: Payer: Self-pay | Admitting: Cardiology

## 2014-07-09 DIAGNOSIS — I10 Essential (primary) hypertension: Secondary | ICD-10-CM

## 2014-07-09 MED ORDER — LISINOPRIL 5 MG PO TABS: 5.0000 mg | ORAL_TABLET | Freq: Every day | ORAL | Status: DC

## 2014-07-09 MED ORDER — PANTOPRAZOLE SODIUM 40 MG PO TBEC: 40.0000 mg | DELAYED_RELEASE_TABLET | Freq: Every day | ORAL | Status: DC

## 2014-07-09 NOTE — Telephone Encounter (Signed)
Pt calling requesting refill on his protonix that he takes daily, and wants this sent to new pharmacy of choice [facility] on [address].  Pt also wanting to inform Dr Meda Coffee that his BP has consistently been staying in the 308M-578I systolic and 69G diastolic post ablation.  Pt states he is on nothing for his BP, except the Cardizem for his afib.  Pt complains of no sob, doe, palpitations, cp, dizziness, or being in afib at this time.  Informed the pt that I spoke with Dr Meda Coffee about this concern and per Dr Meda Coffee he should start taking Lisinopril 5 mg po daily, and come in for a bmet in 3 weeks.  Scheduled the pt for lab appt for 07/30/14 and sent new med to pharmacy of choice.  Pt verbalized understanding and agrees with this plan.

## 2014-07-09 NOTE — Telephone Encounter (Signed)
New message     Talk to [facility].  Pt has a question regarding one of his medications--but he does not know the name of the medication.

## 2014-07-17 ENCOUNTER — Telehealth: Payer: Self-pay | Admitting: Cardiology

## 2014-07-17 NOTE — Telephone Encounter (Signed)
Pt calling to ask Dr Meda Coffee if his protonix and Lisinopril will cause him to be more fatigued in the morning.  Pt states since he started taking Lisinopril for his HTN, he is feeling more fatigued.  Pt reports his BP is now running at 414-239 systolic and runs in the 53U diastolic. HR runnning in the 80-90s during the day.  Pt concerned that his BP is low now and that's causing him to be fatigued.  Pt also reports that sometimes at night he feels his afib come on for a few seconds, and he takes a Metoprolol as needed for this when the rate is over 100.  Pt correlates his fatigue mostly in the mornings, after he's taken his PRN Metoprolol the evening before.  Informed the pt that typically Protonix will not cause him to be fatigued, nor will Lisinopril.  Informed the pt that the fatigue he's experiencing could be due to his BP now returning to normal.  Advised the pt to make sure he's eating an appropriate diet and drinking plenty of fluids, like water.  Reiterated to the pt that he must take the Metoprolol only as needed for afib with elevated HR.  Advised the pt to continue taking his medications prescribed without further recommendation to d/c.  Informed the pt that Dr Meda Coffee is currently out of the office today, but I will route this message to her for further review and recommendation and follow-up thereafter.  Pt verbalized understanding and agrees with this plan.

## 2014-07-17 NOTE — Telephone Encounter (Signed)
New message     Pt c/o medication issue: 1. Name of Medication: pantoprazole and lisinopril 2. How are you currently taking this medication (dosage and times per day)? Pantoprazole 40 mg daily and lisinopril 5mg  daily 3. Are you having a reaction (difficulty breathing--STAT)?  no 4. What is your medication issue? Medication is making pt tired and hard to get out of bedmartin

## 2014-07-18 NOTE — Telephone Encounter (Signed)
Informed the pt that per Dr Meda Coffee she wants him to discontinue his HCTZ for complaints of fatigue.  Pt verbalized understanding and agrees with this plan.

## 2014-07-18 NOTE — Telephone Encounter (Signed)
Please advise him to stop taking hydrochlorothiazide, thank you

## 2014-07-22 ENCOUNTER — Other Ambulatory Visit: Payer: Self-pay

## 2014-07-22 MED ORDER — DILTIAZEM HCL ER COATED BEADS 240 MG PO CP24: 240.0000 mg | ORAL_CAPSULE | Freq: Every day | ORAL | Status: DC

## 2014-07-26 ENCOUNTER — Telehealth: Payer: Self-pay | Admitting: Cardiology

## 2014-07-26 MED ORDER — FUROSEMIDE 20 MG PO TABS: 20.0000 mg | ORAL_TABLET | Freq: Every day | ORAL | Status: DC

## 2014-07-26 NOTE — Telephone Encounter (Signed)
I would suggest to discontinue hydrochlorothiazide and start lasix 20 mg po daily instead. He should be seen in 2 weeks by either me or one of the [facility] people. Thank you, KN

## 2014-07-26 NOTE — Telephone Encounter (Signed)
Patient is having trouble with his RLE swelling and redness again. Patient denies any pain in the RLE. Patient's last phone note on 07/17/2014 had patient discontinuing hydrochlorothiazide 25 mg daily due to fatigue. Patient wants to know if he should start back on hydrochlorothiazide for the swelling. Patient wanted to let the office know he has already taken a dose today. Advised patient to elevate his RLE for now. Will forward to Dr. Meda Coffee for advisement.

## 2014-07-26 NOTE — Telephone Encounter (Signed)
Pt c/o swelling: STAT is pt has developed SOB within 24 hours  1. How long have you been experiencing swelling? Few days  2. Where is the swelling located? Lower Rt leg  3.  Are you currently taking a "fluid pill"? Yes  4.  Are you currently SOB? No  5.  Have you traveled recently?No

## 2014-07-26 NOTE — Telephone Encounter (Signed)
Called patient back and informed him of Dr. Francesca Oman instructions. Sent order for Lasix 20 mg po daily to his pharmacy of choice. Patient already has an appointment on 08/07/14 with Dr. Rayann Heman. Informed patient not to take hydrochlorothiazide again. Patient verbalized understanding and agreed to plan.

## 2014-07-30 ENCOUNTER — Telehealth: Payer: Self-pay | Admitting: *Deleted

## 2014-07-30 ENCOUNTER — Other Ambulatory Visit (INDEPENDENT_AMBULATORY_CARE_PROVIDER_SITE_OTHER): Payer: 59 | Admitting: *Deleted

## 2014-07-30 DIAGNOSIS — I10 Essential (primary) hypertension: Secondary | ICD-10-CM

## 2014-07-30 LAB — BASIC METABOLIC PANEL
BUN: 9 mg/dL (ref 6–23)
CO2: 31 mEq/L (ref 19–32)
Calcium: 8.7 mg/dL (ref 8.4–10.5)
Chloride: 100 mEq/L (ref 96–112)
Creatinine, Ser: 0.88 mg/dL (ref 0.40–1.50)
GFR: 97.21 mL/min (ref >60.00)
Glucose, Bld: 88 mg/dL (ref 70–99)
Potassium: 3.3 mEq/L — ABNORMAL LOW (ref 3.5–5.1)
Sodium: 139 mEq/L (ref 135–145)

## 2014-07-30 MED ORDER — POTASSIUM GLUCONATE 595 (99 K) MG PO TABS
ORAL_TABLET | ORAL | Status: DC
Start: 2014-07-30 — End: 2014-11-04

## 2014-07-30 NOTE — Addendum Note (Signed)
Addended by: Eulis Foster on: 07/30/2014 10:42 AM   Modules accepted: Orders

## 2014-07-30 NOTE — Telephone Encounter (Signed)
-----   Message from Dorothy Spark, MD sent at 07/30/2014  3:36 PM EST ----- He should double his potassium gluconate dose and take two pills instead of one a day

## 2014-07-30 NOTE — Telephone Encounter (Signed)
Informed the pt that per Dr Meda Coffee he should double his potassium gluconate dose and take 2 pills by mouth daily instead of 1 pill daily due to abnormal K level on Bmet.  Pt verbalized understanding and agrees with this plan.

## 2014-08-01 NOTE — Telephone Encounter (Signed)
Follow Up  Dr. Soledad Gerlach with [facility]  [phone]  Surgical Clearance// Dental cleaning.. Will he need to pre medicate.. And is it ok to complete a deep cleaning. Please call. Pt is in office

## 2014-08-01 NOTE — Telephone Encounter (Signed)
Pts Dentist Dr Soledad Gerlach calling in regards to pt needing clearance for the pt to receive a deep cleaning beyond the gum line, clearance for pt to receive several fillings, and clearance for the pt to receive anesthetic Lidocaine with Epi.  Dr Soledad Gerlach reports this is not an urgent need, for the appt has not been scheduled yet. Dr Soledad Gerlach wants to be on the safe side of things, being the pt reported a significant cardiac hx and recent ablation for afib.  Dr Soledad Gerlach requesting a call back or clearance letter faxed to his office at [phone] when complete.  Informed Dr Soledad Gerlach that Dr Meda Coffee is currently out of the office, but I will route this message to her for further review and recommendation and follow up thereafter.  Also informed Dr Soledad Gerlach that pt is scheduled to see one of our EP doctors, Dr Rayann Heman, next Wednesday 3/9, for f/u post hospital ablation.  Informed Dr Soledad Gerlach that I will also make Dr Rayann Heman and his nurse aware of needed clearance as well.  Dr Soledad Gerlach verbalized understanding and agrees with this plan.

## 2014-08-02 ENCOUNTER — Encounter: Payer: Self-pay | Admitting: *Deleted

## 2014-08-02 NOTE — Telephone Encounter (Signed)
Ivy, Please write him a note that there is no contraindication from cardiac standpoint for any dental procedure. IF required by a dentist to stop Xarelto, he should hold the it the morning of the procedure and restart the same night.  Thank you, KN

## 2014-08-02 NOTE — Telephone Encounter (Signed)
Spoke with the pts dentist office Dr Soledad Gerlach and informed them that per Dr Meda Coffee there are no contraindications from a cardiac standpoint for any dental procedure, and if Dr Soledad Gerlach requires the pt to stop Xarelto, he should hold the morning of the procedure and restart the same night.  Will fax a letter as requested to Dr Winfred Burn office at [phone].

## 2014-08-03 NOTE — Telephone Encounter (Signed)
Ok to proceed from EP standpoint Hold xarelto 24 hours only if medically necessary

## 2014-08-07 ENCOUNTER — Ambulatory Visit (INDEPENDENT_AMBULATORY_CARE_PROVIDER_SITE_OTHER): Payer: 59 | Admitting: Internal Medicine

## 2014-08-07 ENCOUNTER — Encounter (HOSPITAL_COMMUNITY)
Admission: AD | Disposition: A | Discharge status: Home / Self Care | Payer: Self-pay | Source: Ambulatory Visit | Attending: Internal Medicine

## 2014-08-07 ENCOUNTER — Other Ambulatory Visit: Payer: Self-pay

## 2014-08-07 ENCOUNTER — Ambulatory Visit (HOSPITAL_COMMUNITY)
Admission: AD | Admit: 2014-08-07 | Discharge: 2014-08-07 | Disposition: A | Discharge status: Home / Self Care | Payer: 59 | Source: Ambulatory Visit | Attending: Internal Medicine | Admitting: Internal Medicine

## 2014-08-07 ENCOUNTER — Encounter: Payer: Self-pay | Admitting: Internal Medicine

## 2014-08-07 VITALS — BP 126/80 | HR 80 | Ht 74.0 in | Wt 201.8 lb

## 2014-08-07 DIAGNOSIS — F419 Anxiety disorder, unspecified: Secondary | ICD-10-CM | POA: Insufficient documentation

## 2014-08-07 DIAGNOSIS — Z79899 Other long term (current) drug therapy: Secondary | ICD-10-CM | POA: Diagnosis not present

## 2014-08-07 DIAGNOSIS — R002 Palpitations: Secondary | ICD-10-CM

## 2014-08-07 DIAGNOSIS — I4892 Unspecified atrial flutter: Secondary | ICD-10-CM | POA: Insufficient documentation

## 2014-08-07 DIAGNOSIS — I480 Paroxysmal atrial fibrillation: Secondary | ICD-10-CM

## 2014-08-07 DIAGNOSIS — I10 Essential (primary) hypertension: Secondary | ICD-10-CM

## 2014-08-07 DIAGNOSIS — Z87891 Personal history of nicotine dependence: Secondary | ICD-10-CM | POA: Diagnosis not present

## 2014-08-07 DIAGNOSIS — Z7901 Long term (current) use of anticoagulants: Secondary | ICD-10-CM | POA: Diagnosis not present

## 2014-08-07 HISTORY — PX: LOOP RECORDER IMPLANT: SHX5477

## 2014-08-07 SURGERY — LOOP RECORDER IMPLANT
Anesthesia: LOCAL

## 2014-08-07 MED ORDER — LIDOCAINE-EPINEPHRINE 1 %-1:100000 IJ SOLN
INTRAMUSCULAR | Status: AC
  Filled 2014-08-07: qty 1

## 2014-08-07 NOTE — CV Procedure (Signed)
SURGEON:  Thompson Grayer, MD     PREPROCEDURE DIAGNOSIS:  Palpitations, post afib ablation, afib management    POSTPROCEDURE DIAGNOSIS:  Palpitations, post afib ablation, afib management     PROCEDURES:   1. Implantable loop recorder implantation    INTRODUCTION:  Evan [name] is a 49 y.o. male with a history of afib s/p recent ablation who presents today for implantable loop implantation.  He has done well since ablation but continues to have palpitations of unclear etiology.  he has previously worn telemetry..  There is significant concern for possible atrial fibrillation as the cause for the patients symptoms, though pacs, pvcs, svt, and atrial flutter could also be possible.  The patient therefore presents today for implantable loop implantation.     DESCRIPTION OF PROCEDURE:  Informed written consent was obtained, and the patient was brought to the electrophysiology lab in a fasting state.  The patient required no sedation for the procedure today.  Mapping over the patient's chest was performed by the EP lab staff to identify the area where electrograms were most prominent for ILR recording.  This area was found to be the left parasternal region over the 3rd-4th intercostal space. The patients left chest was therefore prepped and draped in the usual sterile fashion by the EP lab staff. The skin overlying the left parasternal region was infiltrated with lidocaine for local analgesia.  A 0.5-cm incision was made over the left parasternal region over the 3rd intercostal space.  A subcutaneous ILR pocket was fashioned using a combination of sharp and blunt dissection.  A Medtronic Reveal [place] model [id number] implantable loop recorder was then placed into the pocket  R waves were very prominent and measured 0.44mV. EBL<1 ml.  Steri- Strips and a sterile dressing were then applied.  There were no early apparent complications.     CONCLUSIONS:   1. Successful implantation of a Medtronic Reveal  LINQ implantable loop recorder for palpitations, post afib ablation, afib management  2. No early apparent complications.   Thompson Grayer MD 08/07/2014 1:41 PM

## 2014-08-07 NOTE — Discharge Instructions (Signed)
Loop handout given

## 2014-08-07 NOTE — Progress Notes (Signed)
Electrophysiology Office Note   Date:  08/07/2014   ID:  Evan [name], Evan [name] 03/16/1966, MRN [id number]  PCP:  No primary care provider on file.  Cardiologist:  Dr Meda Coffee Primary Electrophysiologist: Thompson Grayer, MD    Chief Complaint  Patient presents with  . Follow-up    PAF     History of Present Illness: Evan [name] is a 49 y.o. male who presents today for electrophysiology evaluation.   The patient returns today for follow-up post afib ablation.  He has had no adverse events post ablation.  He has had occasional palpitations. These are brief.  At times, he describes what are more likely PACs/PVCs.   Today, he denies symptoms of chest pain, shortness of breath, orthopnea, PND, lower extremity edema, claudication, dizziness, presyncope, syncope, bleeding, or neurologic sequela. The patient is tolerating medications without difficulties and is otherwise without complaint today.    Past Medical History  Diagnosis Date  . Hypertension   . Anxiety   . Psoriasis   . Atrial fibrillation     a. PVI 04/2014 Dr Rayann Heman b. failed medical therapy with Multaq  . Atrial flutter     a. CTI ablation 04/2014 Dr Rayann Heman   Past Surgical History  Procedure Laterality Date  . Hernia repair Right     with mesh  . Carpal tunnel release    . Tee without cardioversion [name] 05/06/2014    Procedure: TRANSESOPHAGEAL ECHOCARDIOGRAM (TEE);  Surgeon: Pixie Casino, MD;  Location: [facility] ENDOSCOPY;  Service: Cardiovascular;  Laterality: [name];  . Atrial fibrillation ablation [name] 05/07/2014    PVI and CTI by Dr Rayann Heman     Current Outpatient Prescriptions  Medication Sig Dispense Refill  . clobetasol ointment (TEMOVATE) 7.67 % Apply 1 application topically 2 (two) times daily as needed (itching).    Marland Kitchen diltiazem (CARDIZEM CD) 240 MG 24 hr capsule Take 1 capsule (240 mg total) by mouth daily. 30 capsule 3  . lisinopril (PRINIVIL,ZESTRIL) 5 MG tablet Take 1 tablet (5 mg total) by mouth daily. 90 tablet 3    . metoprolol tartrate (LOPRESSOR) 25 MG tablet Take 25-50 mg by mouth daily as needed (onset of adfib). Take 1- 2 tablets by mouth if pt has onset of afib or if heartrate goes pass 100    . Multiple Vitamin (MULTIVITAMIN WITH MINERALS) TABS Take 1 tablet by mouth daily.    . potassium gluconate 595 MG TABS tablet Take 2 tablets by mouth daily 180 tablet 3  . rivaroxaban (XARELTO) 20 MG TABS tablet Take 1 tablet (20 mg total) by mouth daily with supper. 30 tablet 3  . furosemide (LASIX) 20 MG tablet Take 1 tablet (20 mg total) by mouth daily. (Patient not taking: Reported on 08/07/2014) 90 tablet 3  . LORazepam (ATIVAN) 0.5 MG tablet Take 1 tablet by mouth daily as needed for anxiety     No current facility-administered medications for this visit.    Allergies:   Adhesive and Eliquis   Social History:  The patient  reports that he has quit smoking. His smoking use included Cigarettes and Cigars. He does not have any smokeless tobacco history on file. He reports that he drinks alcohol. He reports that he does not use illicit drugs.   Family History:  The patient's  family history includes Diabetes in his mother; Heart attack in his brother; Heart failure in his mother; Hypertension in his mother.    ROS:  Please see the history of present illness.  All other systems are reviewed and negative.    PHYSICAL EXAM: VS:  BP 126/80 mmHg  Pulse 80  Ht 6\' 2"  (1.88 m)  Wt 201 lb 12.8 oz (91.536 kg)  BMI 25.90 kg/m2 , BMI Body mass index is 25.9 kg/(m^2). GEN: Well nourished, well developed, in no acute distress HEENT: normal Neck: no JVD, carotid bruits, or masses Cardiac: RRR; no murmurs, rubs, or gallops,no edema  Respiratory:  clear to auscultation bilaterally, normal work of breathing GI: soft, nontender, nondistended, + BS MS: no deformity or atrophy Skin: warm and dry  Neuro:  Strength and sensation are intact Psych: euthymic mood, full affect  EKG:  EKG is ordered today. The ekg  ordered today shows sinus rhythm, normal ekg   Recent Labs: 01/24/2014: TSH 0.75 02/16/2014: ALT 29 03/19/2014: Magnesium 1.8 04/29/2014: Hemoglobin 14.0; Platelets 237.0 07/30/2014: BUN 9; Creatinine 0.88; Potassium 3.3*; Sodium 139    Lipid Panel  No results found for: CHOL, TRIG, HDL, CHOLHDL, VLDL, LDLCALC, LDLDIRECT   Wt Readings from Last 3 Encounters:  08/07/14 201 lb 12.8 oz (91.536 kg)  06/03/14 199 lb 1.9 oz (90.32 kg)  05/07/14 200 lb 9.9 oz (91 kg)      ASSESSMENT AND PLAN:  1.  Paroxysmal atrial fibrillation/ atrial flutter, palpitations Doing well s/p ablation He has palpitations of unclear etiology.  I think that it is prudent to monitor for arrhythmias as the cause.  He has previously worn holter and event monitors. I therefore have recommended implantable loop recorder placement for palpitations/ afib/ arrhythmia management post ablation.  Risks, benefits, and alternatives to this procedure were discussed with the patient who wishes to proceed. chads2vasc score appears to be 1.  Consider stopping anticoagulation once we have better characterized his palpitations.  2. HTN Stable No change required today   Current medicines are reviewed at length with the patient today.   The patient does not have concerns regarding his medicines.  The following changes were made today:  none  Follow-up with Dr Meda Coffee in April as scheduled.  ILR can be interrogated at that time.  I will see again in 3 months   Signed, Thompson Grayer, MD  08/07/2014 12:05 PM     [address] [place] [address] [phone] (office) [phone] (fax)

## 2014-08-07 NOTE — Patient Instructions (Signed)
Your physician recommends that you schedule a follow-up appointment in: 7-10 days from today in device clinic for wound check  LINQ implant today  Go to [facility] now and Dr Rayann Heman will be over to implant

## 2014-08-13 ENCOUNTER — Telehealth: Payer: Self-pay | Admitting: Cardiology

## 2014-08-13 ENCOUNTER — Emergency Department (HOSPITAL_COMMUNITY)
Admission: EM | Admit: 2014-08-13 | Discharge: 2014-08-13 | Disposition: A | Discharge status: Home / Self Care | Payer: 59 | Attending: Emergency Medicine | Admitting: Emergency Medicine

## 2014-08-13 ENCOUNTER — Encounter (HOSPITAL_COMMUNITY): Payer: Self-pay | Admitting: Emergency Medicine

## 2014-08-13 ENCOUNTER — Emergency Department (HOSPITAL_COMMUNITY): Payer: 59

## 2014-08-13 DIAGNOSIS — Z87891 Personal history of nicotine dependence: Secondary | ICD-10-CM | POA: Insufficient documentation

## 2014-08-13 DIAGNOSIS — Z872 Personal history of diseases of the skin and subcutaneous tissue: Secondary | ICD-10-CM | POA: Insufficient documentation

## 2014-08-13 DIAGNOSIS — I480 Paroxysmal atrial fibrillation: Secondary | ICD-10-CM

## 2014-08-13 DIAGNOSIS — I4892 Unspecified atrial flutter: Secondary | ICD-10-CM

## 2014-08-13 DIAGNOSIS — I4891 Unspecified atrial fibrillation: Secondary | ICD-10-CM | POA: Insufficient documentation

## 2014-08-13 DIAGNOSIS — F419 Anxiety disorder, unspecified: Secondary | ICD-10-CM | POA: Insufficient documentation

## 2014-08-13 DIAGNOSIS — I10 Essential (primary) hypertension: Secondary | ICD-10-CM | POA: Insufficient documentation

## 2014-08-13 DIAGNOSIS — Z7901 Long term (current) use of anticoagulants: Secondary | ICD-10-CM | POA: Insufficient documentation

## 2014-08-13 DIAGNOSIS — Z79899 Other long term (current) drug therapy: Secondary | ICD-10-CM | POA: Insufficient documentation

## 2014-08-13 DIAGNOSIS — R000 Tachycardia, unspecified: Secondary | ICD-10-CM | POA: Diagnosis present

## 2014-08-13 DIAGNOSIS — F1010 Alcohol abuse, uncomplicated: Secondary | ICD-10-CM

## 2014-08-13 DIAGNOSIS — I484 Atypical atrial flutter: Secondary | ICD-10-CM

## 2014-08-13 LAB — BASIC METABOLIC PANEL
Anion gap: 11 (ref 5–15)
BUN: 10 mg/dL (ref 6–23)
CO2: 23 mmol/L (ref 19–32)
Calcium: 8.8 mg/dL (ref 8.4–10.5)
Chloride: 103 mmol/L (ref 96–112)
Creatinine, Ser: 0.81 mg/dL (ref 0.50–1.35)
GFR calc Af Amer: >90 mL/min (ref >90)
GFR calc non Af Amer: >90 mL/min (ref >90)
Glucose, Bld: 96 mg/dL (ref 70–99)
Potassium: 4 mmol/L (ref 3.5–5.1)
Sodium: 137 mmol/L (ref 135–145)

## 2014-08-13 LAB — CBC WITH DIFFERENTIAL/PLATELET
Basophils Absolute: 0 10*3/uL (ref 0.0–0.1)
Basophils Relative: 0 % (ref 0–1)
Eosinophils Absolute: 0.1 10*3/uL (ref 0.0–0.7)
Eosinophils Relative: 2 % (ref 0–5)
HCT: 43.7 % (ref 39.0–52.0)
Hemoglobin: 15.1 g/dL (ref 13.0–17.0)
Lymphocytes Relative: 21 % (ref 12–46)
Lymphs Abs: 1.4 10*3/uL (ref 0.7–4.0)
MCH: 35.4 pg — ABNORMAL HIGH (ref 26.0–34.0)
MCHC: 34.6 g/dL (ref 30.0–36.0)
MCV: 102.6 fL — ABNORMAL HIGH (ref 78.0–100.0)
Monocytes Absolute: 0.6 10*3/uL (ref 0.1–1.0)
Monocytes Relative: 9 % (ref 3–12)
Neutro Abs: 4.7 10*3/uL (ref 1.7–7.7)
Neutrophils Relative %: 68 % (ref 43–77)
Platelets: 275 10*3/uL (ref 150–400)
RBC: 4.26 MIL/uL (ref 4.22–5.81)
RDW: 13.4 % (ref 11.5–15.5)
WBC: 6.9 10*3/uL (ref 4.0–10.5)

## 2014-08-13 LAB — I-STAT TROPONIN, ED: TROPONIN I, POC: 0.01 ng/mL (ref 0.00–0.08)

## 2014-08-13 MED ORDER — DILTIAZEM HCL 25 MG/5ML IV SOLN
10.0000 mg | Freq: Once | INTRAVENOUS | Status: AC
  Administered 2014-08-13: 10 mg via INTRAVENOUS
  Filled 2014-08-13: qty 5

## 2014-08-13 MED ORDER — SOTALOL HCL 80 MG PO TABS: 80.0000 mg | ORAL_TABLET | Freq: Two times a day (BID) | ORAL | Status: DC

## 2014-08-13 MED ORDER — SODIUM CHLORIDE 0.9 % IV BOLUS (SEPSIS)
1000.0000 mL | Freq: Once | INTRAVENOUS | Status: AC
  Administered 2014-08-13: 1000 mL via INTRAVENOUS

## 2014-08-13 MED ORDER — DILTIAZEM HCL 25 MG/5ML IV SOLN: 15.0000 mg | Freq: Once | INTRAVENOUS | Status: DC

## 2014-08-13 MED ORDER — DILTIAZEM HCL 100 MG IV SOLR
5.0000 mg/h | Freq: Once | INTRAVENOUS | Status: AC
  Administered 2014-08-13: 5 mg/h via INTRAVENOUS

## 2014-08-13 NOTE — ED Notes (Addendum)
Pt sts afib started last night; pt sts hx of same and worse today; pt had ablation in December and had loop recorder placed last week; pt denies pain; pt sts took 4 doses of metoprolol during night without relief

## 2014-08-13 NOTE — Telephone Encounter (Signed)
Pt calling with complaints of afib with a rate of 140-160, and at one time 200 bpm.  Pt states he monitors this with a pulse oximetry.  Pt states his afib occurred last night after dinner.  Pt states he has consistently stayed in afib since last night.  Pt states he doesn't have cp or sob, just feels an intermittent fluttering sensation in his chest.  Pt states he had a link recorder put in last week by Dr Rayann Heman to assess if the pt was in a fib or a flutter.  Contacted our device team to access the link recorder, and the report cannot be obtained due to the pt isn't set up with the company yet.  Contacted Dr Meda Coffee in regards to pts complaints and symptoms and per Dr Meda Coffee the pt should report to [facility] ER for further assessment of a fib/flutter.  Contacted the pt to inform him that Dr Meda Coffee would like for him to report the [facility] ER.  Informed the pt that I will notify our card master [name] of arrival to the ER.  Pt states he is going private vehicle with his father to drive him.  Pt verbalized understanding and agrees with this plan.

## 2014-08-13 NOTE — Telephone Encounter (Signed)
New message     Patient calling  C/O heart rate is up since last night 140 -150 to 160 range.  Medication was taken. This am 152 . Min ago  250 . Retake 152.

## 2014-08-13 NOTE — Discharge Instructions (Signed)
Return to the ED with any concerns including fainting, chest pain, difficulty breathing, decreased level of alertness/lethargy, or any other alarming symptoms  You should arrange for followup in the atrial fibrillation clinic on Thursday morning 08/15/14 for a repeat EKG per Dr. Rayann Heman

## 2014-08-13 NOTE — ED Provider Notes (Signed)
CSN: [id number]     Arrival date & time 08/13/14  1320 History   First MD Initiated Contact with Patient 08/13/14 1356     Chief Complaint  Patient presents with  . Tachycardia     (Consider location/radiation/quality/duration/timing/severity/associated sxs/prior Treatment) HPI  This is a 49 year old male with a history of hypertension, atrial fibrillation, flutter who presents with tachycardia. Patient reports onset of tachycardia with heart rates in the 150s last night. He took 4 separate doses of metoprolol 25 mg  without any resolution of his heart rate. He denies any chest pain or shortness of breath associated with this. Patient has a history of palpitations and failed ablations for atrial fibrillation, atrial flutter. He had a loop recorder implanted last week.  On admission to triage, patient's heart rate in the 160s. He denies any diaphoresis, chest pain, shortness of breath, abdominal pain, nausea, vomiting. He does endorse palpitations. Denies any recent changes in medications. He does take diltiazem as well as metoprolol when necessary.  Past Medical History  Diagnosis Date  . Hypertension   . Anxiety   . Psoriasis   . Atrial fibrillation     a. PVI 04/2014 Dr Rayann Heman b. failed medical therapy with Multaq  . Atrial flutter     a. CTI ablation 04/2014 Dr Rayann Heman   Past Surgical History  Procedure Laterality Date  . Hernia repair Right     with mesh  . Carpal tunnel release    . Tee without cardioversion [name] 05/06/2014    Procedure: TRANSESOPHAGEAL ECHOCARDIOGRAM (TEE);  Surgeon: Pixie Casino, MD;  Location: [facility] ENDOSCOPY;  Service: Cardiovascular;  Laterality: [name];  . Atrial fibrillation ablation [name] 05/07/2014    PVI and CTI by Dr Rayann Heman  . Loop recorder implant [name] 08/07/2014    Procedure: LOOP RECORDER IMPLANT;  Surgeon: Thompson Grayer, MD;  Location: [facility] CATH LAB;  Service: Cardiovascular;  Laterality: [name];   Family History  Problem Relation Age of Onset  . Hypertension  Mother   . Heart failure Mother   . Diabetes Mother   . Heart attack Brother    History  Substance Use Topics  . Smoking status: Former Smoker    Types: Cigarettes, Cigars  . Smokeless tobacco: Not on file  . Alcohol Use: Yes     Comment: wine at night to help relax    Review of Systems  Constitutional: Negative.  Negative for fever.  Respiratory: Negative.  Negative for chest tightness and shortness of breath.   Cardiovascular: Positive for palpitations. Negative for chest pain.  Gastrointestinal: Negative.  Negative for abdominal pain.  Genitourinary: Negative.  Negative for dysuria.  Neurological: Negative for headaches.  All other systems reviewed and are negative.     Allergies  Adhesive and Eliquis  Home Medications   Prior to Admission medications   Medication Sig Start Date End Date Taking? Authorizing Provider  clobetasol ointment (TEMOVATE) 4.27 % Apply 1 application topically 2 (two) times daily as needed (itching).   Yes Historical Provider, MD  diltiazem (CARDIZEM CD) 240 MG 24 hr capsule Take 1 capsule (240 mg total) by mouth daily. 07/22/14  Yes Thompson Grayer, MD  furosemide (LASIX) 20 MG tablet Take 1 tablet (20 mg total) by mouth daily. 07/26/14  Yes Dorothy Spark, MD  lisinopril (PRINIVIL,ZESTRIL) 5 MG tablet Take 1 tablet (5 mg total) by mouth daily. 07/09/14  Yes Dorothy Spark, MD  LORazepam (ATIVAN) 0.5 MG tablet Take 1 tablet by mouth daily as needed for  anxiety   Yes Historical Provider, MD  metoprolol tartrate (LOPRESSOR) 25 MG tablet Take 25-50 mg by mouth daily as needed (onset of adfib). Take 1- 2 tablets by mouth if pt has onset of afib or if heartrate goes pass 100   Yes Historical Provider, MD  Multiple Vitamin (MULTIVITAMIN WITH MINERALS) TABS Take 1 tablet by mouth daily.   Yes Historical Provider, MD  potassium gluconate 595 MG TABS tablet Take 2 tablets by mouth daily 07/30/14  Yes Dorothy Spark, MD  rivaroxaban (XARELTO) 20 MG TABS  tablet Take 1 tablet (20 mg total) by mouth daily with supper. 06/21/14  Yes Burtis Junes, NP  zinc gluconate 50 MG tablet Take 50 mg by mouth daily.   Yes Historical Provider, MD   BP 111/69 mmHg  Pulse 106  Temp(Src) 98.7 F (37.1 C) (Oral)  Resp 17  SpO2 97% Physical Exam  Constitutional: He is oriented to person, place, and time. He appears well-developed and well-nourished. No distress.  HENT:  Head: Normocephalic and atraumatic.  Eyes: Pupils are equal, round, and reactive to light.  Cardiovascular: Regular rhythm and normal heart sounds.   No murmur heard. Tachycardia  Pulmonary/Chest: Effort normal and breath sounds normal. No respiratory distress. He has no wheezes.  Small incision noted over the left chest wall, no associated erythema or tenderness to palpation,  device palpated  Abdominal: Soft. Bowel sounds are normal. There is no tenderness. There is no rebound.  Musculoskeletal: He exhibits no edema.  Lymphadenopathy:    He has no cervical adenopathy.  Neurological: He is alert and oriented to person, place, and time.  Skin: Skin is warm and dry.  Psychiatric: He has a normal mood and affect.  Nursing note and vitals reviewed.   ED Course  Procedures (including critical care time)  CRITICAL CARE Performed by: Merryl Hacker   Total critical care time: 35 min  Critical care time was exclusive of separately billable procedures and treating other patients.  Critical care was necessary to treat or prevent imminent or life-threatening deterioration.  Critical care was time spent personally by me on the following activities: development of treatment plan with patient and/or surrogate as well as nursing, discussions with consultants, evaluation of patient's response to treatment, examination of patient, obtaining history from patient or surrogate, ordering and performing treatments and interventions, ordering and review of laboratory studies, ordering and review  of radiographic studies, pulse oximetry and re-evaluation of patient's condition.  Labs Review Labs Reviewed  CBC WITH DIFFERENTIAL/PLATELET - Abnormal; Notable for the following:    MCV 102.6 (*)    MCH 35.4 (*)    All other components within normal limits  BASIC METABOLIC PANEL  I-STAT TROPOININ, ED    Imaging Review No results found.   EKG Interpretation   Date/Time:  Tuesday August 13 2014 13:27:25 EDT Ventricular Rate:  162 PR Interval:  118 QRS Duration: 84 QT Interval:  238 QTC Calculation: 390 R Axis:   71 Text Interpretation:  Sinus tachycardia vs atrial flutter Nonspecific ST  abnormality Abnormal ECG Reconfirmed by HORTON  MD, Loma Sousa ([id number]) on  08/13/2014 2:19:27 PM      MDM   Final diagnoses:  Atrial flutter, unspecified    Patient presents with palpitations and tachycardia. History of difficult to manage atrial fibrillation/flutter. Recently had a loop recorder placed by Dr. Rayann Heman.  Otherwise nontoxic-appearing. Tachycardia in the 160s. Initial EKG sinus tach versus atrial flutter. Patient was given a 10 mg bolus of  diltiazem and slowed into the 120s. Repeat EKG with obvious atrial flutter.  Patient placed on a diltiazem drip and cardiology consulted. Heart rate 100-110 on diltiazem drip.  Cardiology to evaluate and anticipate admit.    Merryl Hacker, MD 08/13/14 1500

## 2014-08-13 NOTE — Telephone Encounter (Signed)
[name] notified at 12:46 pm of pt coming to the ER for cardiac work-up.

## 2014-08-13 NOTE — Consult Note (Signed)
ELECTROPHYSIOLOGY CONSULT NOTE    Patient ID: Evan [name] MRN: [id number], DOB/AGE: [birth year] 49 y.o.  Admit date: 08/13/2014 Date of Consult: 08/13/2014  Primary Physician: No primary care provider on file. Primary Cardiologist: Meda Coffee Electrophysiologist: Ilia Dimaano  Reason for Consultation: atrial flutter  HPI:  Evan [name] is a 49 y.o. male with a past medical history of hypertension and atrial fibrillation/atrial flutter.  He is s/p PVI and CTI 04/2014 by Dr Rayann Heman.  He has had palpitations post ablation but without documented arrhythmia.  MDT LINQ was placed 08-07-14 to evaluate.   He developed tachy palpitations last night and called the office this morning after HR monitor at home reported HR of 250bpm who advised that he go to the ER for further evaluation.  He reports compliance with Xarelto, Metoprolol, Diltiazem.  He is unaware for triggers for AF other than that episodes typically occur after eating.   Myoview 02-06-14 demonstrated EF 47%, very small area of basal inferoseptal mild ischemia with mild inferior hypokinesis (Dr Meda Coffee felt Flecainide should be avoided). Echocardiogram 04/2014 demonstrated EF 55-60%, no RWMA, mild MR and TR, LA 42.  He has been placed on Cardizem drip in ER with improvement in ventricular rates.   He denies chest pain, shortness of breath, recent fevers, chills, nausea or vomiting.  ROS is otherwise negative except as outlined above.   Past Medical History  Diagnosis Date  . Hypertension   . Anxiety   . Psoriasis   . Atrial fibrillation     a. PVI 04/2014 Dr Rayann Heman b. failed medical therapy with Multaq  . Atrial flutter     a. CTI ablation 04/2014 Dr Rayann Heman     Surgical History:  Past Surgical History  Procedure Laterality Date  . Hernia repair Right     with mesh  . Carpal tunnel release    . Tee without cardioversion [name] 05/06/2014    Procedure: TRANSESOPHAGEAL ECHOCARDIOGRAM (TEE);  Surgeon: Pixie Casino, MD;  Location: [facility]  ENDOSCOPY;  Service: Cardiovascular;  Laterality: [name];  . Atrial fibrillation ablation [name] 05/07/2014    PVI and CTI by Dr Rayann Heman  . Loop recorder implant [name] 08/07/2014    Procedure: LOOP RECORDER IMPLANT;  Surgeon: Thompson Grayer, MD;  Location: [facility] CATH LAB;  Service: Cardiovascular;  Laterality: [name];      Current outpatient prescriptions:  .  clobetasol ointment (TEMOVATE) 0.62 %, Apply 1 application topically 2 (two) times daily as needed (itching)., Disp: , Rfl:  .  diltiazem (CARDIZEM CD) 240 MG 24 hr capsule, Take 1 capsule (240 mg total) by mouth daily., Disp: 30 capsule, Rfl: 3 .  furosemide (LASIX) 20 MG tablet, Take 1 tablet (20 mg total) by mouth daily. (Patient not taking: Reported on 08/07/2014), Disp: 90 tablet, Rfl: 3 .  lisinopril (PRINIVIL,ZESTRIL) 5 MG tablet, Take 1 tablet (5 mg total) by mouth daily., Disp: 90 tablet, Rfl: 3 .  metoprolol tartrate (LOPRESSOR) 25 MG tablet, Take 25-50 mg by mouth daily as needed (onset of adfib). Take 1- 2 tablets by mouth if pt has onset of afib or if heartrate goes pass 100, Disp: , Rfl:  .  Multiple Vitamin (MULTIVITAMIN WITH MINERALS) TABS, Take 1 tablet by mouth daily., Disp: , Rfl:  .  potassium gluconate 595 MG TABS tablet, Take 2 tablets by mouth daily, Disp: 180 tablet, Rfl: 3 .  rivaroxaban (XARELTO) 20 MG TABS tablet, Take 1 tablet (20 mg total) by mouth daily with supper., Disp: 30 tablet, Rfl:  3  Allergies:  Allergies  Allergen Reactions  . Adhesive [Tape] Hives, Itching and Dermatitis    From monitor electrodes, even the sensitive skin ones  . Eliquis [Apixaban] Hives, Itching and Dermatitis    Has not confirmed yet per pt (04/22/14)  He believes it was the electrodes causing the issue because it was only underneath where electrodes from the monitor were placed    History   Social History  . Marital Status: Legally Separated    Spouse Name: [name]  . Number of Children: [name]  . Years of Education: [name]   Occupational History    . Not on file.   Social History Main Topics  . Smoking status: Former Smoker    Types: Cigarettes, Cigars  . Smokeless tobacco: Not on file  . Alcohol Use: Yes     Comment: wine at night to help relax  . Drug Use: No  . Sexual Activity: Not on file   Other Topics Concern  . Not on file   Social History Narrative     Family History  Problem Relation Age of Onset  . Hypertension Mother   . Heart failure Mother   . Diabetes Mother   . Heart attack Brother      Physical Exam: Filed Vitals:   08/13/14 1330 08/13/14 1408 08/13/14 1415  BP: 115/85 108/89 108/86  Pulse: 161 154 155  Temp: 98.7 F (37.1 C)    TempSrc: Oral    Resp: 18 15 23   SpO2: 96% 95% 96%    GEN- The patient is well appearing, alert and oriented x 3 today.   HEENT: normocephalic, atraumatic; sclera clear, conjunctiva pink; hearing intact; oropharynx clear; neck supple, no JVP Lungs- Clear to ausculation bilaterally, normal work of breathing.  No wheezes, rales, rhonchi Heart- Irregular rate and rhythm, no murmurs, rubs or gallops GI- soft, non-tender, non-distended, bowel sounds present Extremities- no clubbing, cyanosis, or edema; DP/PT/radial pulses 2+ bilaterally MS- no significant deformity or atrophy Skin- multiple tattoos, warm and dry, no rash or lesion, LINQ incision with steri-strips Psych- euthymic mood, full affect Neuro- strength and sensation are intact  Labs:   Lab Results  Component Value Date   WBC 6.9 08/13/2014   HGB 15.1 08/13/2014   HCT 43.7 08/13/2014   MCV 102.6* 08/13/2014   PLT 275 08/13/2014   No results for input(s): NA, K, CL, CO2, BUN, CREATININE, CALCIUM, PROT, BILITOT, ALKPHOS, ALT, AST, GLUCOSE in the last 168 hours.  Invalid input(s): LABALBU  EKG: atrial flutter, ventricular rate 131  TELEMETRY: atrial flutter  DEVICE HISTORY: MDT LINQ implanted 08-07-14 for palpitations  1. Atrial fibrillation S/p PVI and CTI 04/2014 Presents today with atrial  flutter Reports compliance with Xarelto since ablation Myoview in the past with areas of infarct/ischemia - Dr Meda Coffee feels that Flecainide should be avoided He was previously on Multaq but felt this caused excess fatigue Options are cardioversion and discharge with lifestyle modification (avoidance of ETOH) or initiation of AAD (likely Tikosyn or Sotalol)  2.  HTN Stable No change required today  3.  Alcohol use Cessation advised today Pt reports unable to sleep without wine, advised to establish with PCP to discuss options for insomnia.   Signed, Chanetta Marshall, NP 08/13/2014 2:26 PM   I have seen, examined the patient, and reviewed the above assessment and plan.  Changes to above are made where necessary.  On exam, now RRR.  Given recurrent atrial fibrillation today, I think that we should consider AAD  therapy.  I discussed options of multaq, tikosyn, and sotalol.  He would prefer sotalol.  I will therefore start sotalol 80mg  BID.  He can go home from ER with ekg Thursday am.  Continue anticoagulation.  cahds2vasc score is at least 1.  ETOH cessation  Is strongly advised.  DC to home from ED w follow-up in AF clinic Thursday am for repeat EKG on Sotalol.  Co Sign: Thompson Grayer, MD 08/13/2014 4:58 PM

## 2014-08-14 ENCOUNTER — Ambulatory Visit: Payer: Self-pay

## 2014-08-15 ENCOUNTER — Encounter: Payer: Self-pay | Admitting: Nurse Practitioner

## 2014-08-15 ENCOUNTER — Ambulatory Visit (INDEPENDENT_AMBULATORY_CARE_PROVIDER_SITE_OTHER): Payer: 59 | Admitting: Nurse Practitioner

## 2014-08-15 VITALS — BP 118/72 | HR 62 | Ht 74.0 in | Wt 199.0 lb

## 2014-08-15 DIAGNOSIS — I480 Paroxysmal atrial fibrillation: Secondary | ICD-10-CM

## 2014-08-15 NOTE — Progress Notes (Signed)
Date:  08/15/2014   ID:  Evan [name], DOB 1965/09/07, MRN [id number]  PCP:  No PCP Per Patient  Cardiologist:  Dr Meda Coffee Primary Electrophysiologist: Dr. Rayann Heman   Chief Complaint  Patient presents with  . Atrial Fibrillation     History of Present Illness: Evan [name] is a 49 y.o. male with h/o persistent afib, s/p afib ablation 12/8 with persistent c/o tachyarrythmias and loop recorder implanted 3/9 for further evaluation.Marland KitchenHe presented to the ER 3/15 with aflutter with v rate 131. V. Rates slowed with Cardizem drip. He was seen in consult by Dr. Rayann Heman and the decision was made to load on sotalol. He is here today for EKG/symptom f/u. He reports that he has felt well since d/c from ER and is tolerating sotalol well without any further episodes of fast heart rate. He was drinking wine nightly as a sleep aid but has been made aware that this could be a trigger for afib and made the decision to stop alcohol and is now on melatonin with good results.  Today, he denies symptoms of chest pain, shortness of breath, orthopnea, PND, lower extremity edema, claudication, dizziness, presyncope, syncope, bleeding, or neurologic sequela. The patient is tolerating medications without difficulties and is otherwise without complaint today.    Past Medical History  Diagnosis Date  . Hypertension   . Anxiety   . Psoriasis   . Atrial fibrillation     a. PVI 04/2014 Dr Rayann Heman b. failed medical therapy with Multaq  . Atrial flutter     a. CTI ablation 04/2014 Dr Rayann Heman   Past Surgical History  Procedure Laterality Date  . Hernia repair Right     with mesh  . Carpal tunnel release    . Tee without cardioversion [name] 05/06/2014    Procedure: TRANSESOPHAGEAL ECHOCARDIOGRAM (TEE);  Surgeon: Pixie Casino, MD;  Location: [facility] ENDOSCOPY;  Service: Cardiovascular;  Laterality: [name];  . Atrial fibrillation ablation [name] 05/07/2014    PVI and CTI by Dr Rayann Heman  . Loop recorder implant [name]  08/07/2014    Procedure: LOOP RECORDER IMPLANT;  Surgeon: Thompson Grayer, MD;  Location: [facility] CATH LAB;  Service: Cardiovascular;  Laterality: [name];     Current Outpatient Prescriptions  Medication Sig Dispense Refill  . clobetasol ointment (TEMOVATE) 4.31 % Apply 1 application topically 2 (two) times daily as needed (itching).    Marland Kitchen diltiazem (CARDIZEM CD) 240 MG 24 hr capsule Take 1 capsule (240 mg total) by mouth daily. 30 capsule 3  . lisinopril (PRINIVIL,ZESTRIL) 5 MG tablet Take 1 tablet (5 mg total) by mouth daily. 90 tablet 3  . LORazepam (ATIVAN) 0.5 MG tablet Take 1 tablet by mouth daily as needed for anxiety    . Melatonin 1 MG TABS Take 1 tablet by mouth at bedtime as needed.    . metoprolol tartrate (LOPRESSOR) 25 MG tablet Take 25-50 mg by mouth daily as needed (onset of adfib). Take 1- 2 tablets by mouth if pt has onset of afib or if heartrate goes pass 100    . Multiple Vitamin (MULTIVITAMIN WITH MINERALS) TABS Take 1 tablet by mouth daily.    . potassium gluconate 595 MG TABS tablet Take 2 tablets by mouth daily 180 tablet 3  . rivaroxaban (XARELTO) 20 MG TABS tablet Take 1 tablet (20 mg total) by mouth daily with supper. 30 tablet 3  . sotalol (BETAPACE) 80 MG tablet Take 1 tablet (80 mg total) by mouth  2 (two) times daily. 60 tablet 0  . zinc gluconate 50 MG tablet Take 50 mg by mouth daily.    . furosemide (LASIX) 20 MG tablet Take 1 tablet (20 mg total) by mouth daily. (Patient not taking: Reported on 08/15/2014) 90 tablet 3   No current facility-administered medications for this visit.    Allergies:   Adhesive and Eliquis   Social History:  The patient  reports that he has quit smoking. His smoking use included Cigarettes and Cigars. He does not have any smokeless tobacco history on file. He reports that he drinks alcohol. He reports that he does not use illicit drugs.   Family History:  The patient's  family history includes Diabetes in his mother; Heart attack in his  brother; Heart failure in his mother; Hypertension in his mother.    ROS:  Please see the history of present illness.   All other systems are reviewed and negative.    PHYSICAL EXAM: VS:  BP 118/72 mmHg  Pulse 62  Ht 6\' 2"  (1.88 m)  Wt 199 lb (90.266 kg)  BMI 25.54 kg/m2 , BMI Body mass index is 25.54 kg/(m^2). GEN: Well nourished, well developed, in no acute distress HEENT: normal Neck: no JVD, carotid bruits, or masses Cardiac: RRR; no murmurs, rubs, or gallops,no edema  Respiratory:  clear to auscultation bilaterally, normal work of breathing GI: soft, nontender, nondistended, + BS MS: no deformity or atrophy Skin: warm and dry  Neuro:  Strength and sensation are intact Psych: euthymic mood, full affect  EKG:  EKG is ordered today. The ekg ordered today shows sinus rhythm, normal ekg   Recent Labs: 01/24/2014: TSH 0.75 02/16/2014: ALT 29 03/19/2014: Magnesium 1.8 08/13/2014: BUN 10; Creatinine 0.81; Hemoglobin 15.1; Platelets 275; Potassium 4.0; Sodium 137    Lipid Panel  No results found for: CHOL, TRIG, HDL, CHOLHDL, VLDL, LDLCALC, LDLDIRECT   Wt Readings from Last 3 Encounters:  08/15/14 199 lb (90.266 kg)  08/07/14 197 lb (89.359 kg)  08/07/14 201 lb 12.8 oz (91.536 kg)    EKG- NSR with IRBBB, QTC 418 ms. No significant change from EKG 08/07/14. Epic records from recent ER visit reviewed.  ASSESSMENT AND PLAN:  1.  Paroxysmal atrial fibrillation/ atrial flutter, palpitations s/p PVI/Loop recorder Doing well so far on sotalol without further tachyarrythmias. EKG without remarkable changes with recent start of sotalol. Continue 80 mg bid. For now continue xarelto with chadsvasc of 1, maybe able to stop at some point in future.  2. HTN Stable No change required today  3. Continue to avoid alcohol due to possible trigger. Currently using melatonin as a sleep aid.   Follow-up with Dr Meda Coffee in April as scheduled.  ILR can be interrogated at that time.   F/u  Dr. Rayann Heman in 2 months.   Eduard Roux, NP  08/15/2014 11:12 AM

## 2014-08-15 NOTE — Patient Instructions (Signed)
Your physician recommends that you schedule a follow-up appointment in: 2 months with Dr Allred  

## 2014-08-28 ENCOUNTER — Telehealth: Payer: Self-pay | Admitting: Cardiology

## 2014-08-28 NOTE — Telephone Encounter (Signed)
If he has no symptoms, I would continue the same regimen. I will follow with  Him at the next visit on 09/02/14. Thank you, K

## 2014-08-28 NOTE — Telephone Encounter (Signed)
Contacted the pt to inform him that per Dr Meda Coffee, being he has no symptoms, he should continue same regimen, and she will follow with him at next weeks OV on 09/02/14 at 1100. Pt verbalized understanding and agrees with this plan.

## 2014-08-28 NOTE — Telephone Encounter (Signed)
Pt calling to inform Dr Meda Coffee and nurse that last night he placed a pulse ox on his finger and noted his HR go from 38 bpm to 70 bpm, irregular.  Pt states he was completely asymptomatic at that time, and nothing warranted him to place the pulse ox on his finger.  Pt states he was just curious to see what his HR was, because Dr Rayann Heman started him on a new med Sotalol 80 mg bid a couple of weeks ago for his rapid afib.  Pt states he is feeling fine, with a HR of 66 right now, and BP- 120/84.  Pt has no complaints at this time.  Pt states last night just concerned him when he saw the low reading on his pulse ox.  Advised the pt to continue current regimen as prescribed, and to hold off on taking the PRN Metoprolol, especially when he notates his pulse being low.  Pt states that since he started taking Sotalol his rate has now maintained between 60 - 90 bpm, which is good for him, but the 38 bpm alarmed him. Pt states when he noted his low HR last night, he had no dizziness, cp, sob, pre-syncopal, or syncopal episodes.  Pt states he felt fine.  Advised the pt if his rate drops below normal again, and he becomes symptomatic with cp, sob, fatigue, dizziness, blurred vision, pre-syncopal, or syncopal episodes, then he should seek immediate medical attention.  Informed the pt given he's asymptomatic at this time, with normal VS, he should continue monitoring and stay plenty hydrated, and eat a healthy minimum of sodium, and we will see him as scheduled for next Monday 09/02/14 with Dr Meda Coffee at 1100.  Informed the pt if he needs [place] between now and then, he should contact our office to speak with a nurse.  Informed the pt that Dr Meda Coffee is currently out of the office this week, but I will route this message to her for further review.  Pt verbalized understanding, agrees with this plan, and gracious for all the assistance provided.

## 2014-08-28 NOTE — Telephone Encounter (Signed)
New message      Past night HR was 38. No other symptoms.  Please advise

## 2014-09-01 ENCOUNTER — Telehealth: Payer: Self-pay | Admitting: Physician Assistant

## 2014-09-01 NOTE — Telephone Encounter (Signed)
States his HR has been in the 40s after starting sotalol, he was previously on 240mg  diltiazem and started on 80mg  BID of sotalol for difficult to control a-fib. He has held yesterday afternoon's sotalol and this morning's dose. I have instructed him to resume medication as long as his HR is not persistently in the 30s or as long as he is asymptomatic.   If he has symptoms, would still keep him on sotalol and decrease diltiazem by half (in that case will need new Rx as his diltiazem is in capsule)  Signed, Almyra Deforest PA Pager: [phone]

## 2014-09-02 ENCOUNTER — Ambulatory Visit: Payer: Self-pay | Admitting: Cardiology

## 2014-09-02 ENCOUNTER — Other Ambulatory Visit (HOSPITAL_COMMUNITY): Payer: Self-pay | Admitting: *Deleted

## 2014-09-02 ENCOUNTER — Telehealth (HOSPITAL_COMMUNITY): Payer: Self-pay | Admitting: *Deleted

## 2014-09-02 MED ORDER — DILTIAZEM HCL ER COATED BEADS 120 MG PO CP24: 120.0000 mg | ORAL_CAPSULE | Freq: Every day | ORAL | Status: DC

## 2014-09-02 NOTE — Progress Notes (Signed)
Patient called saying [facility] would not have his new prescription ready until tomorrow so wanted a new prescription sent to [facility] so that he can start his decrease dose of cardizem today.

## 2014-09-02 NOTE — Telephone Encounter (Signed)
Patient called stating heart rate was dropping into 40s with sotalol. Roderic Palau stated to decrease cardizem to 120mg  daily. Sent in new prescription for this and patient will call back in few days to report HR with decrease.

## 2014-09-04 ENCOUNTER — Encounter: Payer: Self-pay | Admitting: Internal Medicine

## 2014-09-05 ENCOUNTER — Telehealth: Payer: Self-pay | Admitting: Physician Assistant

## 2014-09-05 NOTE — Telephone Encounter (Signed)
Evan [name] has a history of paroxysmal atrial fibrillation. He was recently hospitalized and started on sotalol. He was also on Cardizem CD 240 mg daily.  He states that since being released from the hospital, his heart rate has gradually trended down. Recently he noted that it was in the 20s. He does not think he is in atrial fibrillation and believes he is in sinus rhythm. When he called the office to discuss this, his Cardizem CD was decreased from 240 mg daily down to 120 mg daily.  He has continued to notice his heart rate being in the 40s. He is getting concerned about this. He is asymptomatic and denies chest pain, shortness of breath, presyncope. He states his palpitations are much improved since being on the sotalol and he has only had one or 2 brief episodes since discharge from the hospital.  Advised him that since he was asymptomatic, it was okay to continue the sotalol tonight. Tomorrow, he is to discontinue the Cardizem completely and continue to follow his heart rate and blood pressure. If he notices that his heart rate is becoming rapid or irregular, he is to call us. If his heart rate does become rapid and irregular, he can continue to use the metoprolol as a when necessary medication. If he develops symptoms such as presyncope or weakness, shortness of breath or anything else that might be related to the bradycardia, he is to call us immediately.  Evan [name] felt comfortable with this as a plan of care.  Rosaria Ferries, PA-C 09/05/2014 8:32 PM Beeper [phone]

## 2014-09-06 ENCOUNTER — Encounter: Payer: Self-pay | Admitting: Internal Medicine

## 2014-09-06 ENCOUNTER — Ambulatory Visit (INDEPENDENT_AMBULATORY_CARE_PROVIDER_SITE_OTHER): Payer: 59 | Admitting: *Deleted

## 2014-09-06 ENCOUNTER — Other Ambulatory Visit: Payer: Self-pay | Admitting: *Deleted

## 2014-09-06 DIAGNOSIS — I4891 Unspecified atrial fibrillation: Secondary | ICD-10-CM | POA: Diagnosis not present

## 2014-09-06 MED ORDER — SOTALOL HCL 80 MG PO TABS: 80.0000 mg | ORAL_TABLET | Freq: Two times a day (BID) | ORAL | Status: DC

## 2014-09-10 NOTE — Progress Notes (Signed)
Loop recorder 

## 2014-09-17 ENCOUNTER — Encounter: Payer: Self-pay | Admitting: Internal Medicine

## 2014-09-18 ENCOUNTER — Encounter: Payer: Self-pay | Admitting: Internal Medicine

## 2014-09-19 ENCOUNTER — Other Ambulatory Visit: Payer: Self-pay | Admitting: Nurse Practitioner

## 2014-09-20 ENCOUNTER — Encounter: Payer: Self-pay | Admitting: Internal Medicine

## 2014-09-24 ENCOUNTER — Telehealth (HOSPITAL_COMMUNITY): Payer: Self-pay | Admitting: *Deleted

## 2014-09-24 MED ORDER — SOTALOL HCL 80 MG PO TABS: 40.0000 mg | ORAL_TABLET | Freq: Two times a day (BID) | ORAL | Status: DC

## 2014-09-24 NOTE — Telephone Encounter (Addendum)
Patient called in stating he is having bad side effects from possibly the sotalol - he describes being extremely groggy, has no appetite, nausea. He said this started about a week after he started taking the sotalol.  States he had this same reaction when he was on multaq and it had to be discontinued.  Patient states he still goes in and out of afib but not as frequently but does notice his heart rate going down into the 40s especially in the evenings.  He recently was taken off of cardizem due to the pulse being in 40s which was thought to be due to the combination of sotalol and cardizem. States he did not have any issues with symptoms when on cardizem.  Will discuss with Roderic Palau, NP and call pt back shortly.    Discussed with Roderic Palau, NP -- suggested either cut sotalol in half to 40mg  BID or completely stop sotalol and restart cardizem 120mg  daily.  Patient decided to try cutting sotalol in half and see if that would help with side effects yet still keep his heart rate down.  Pt stated he would call me in a few days and report in how the decrease dose is working for him.  If at that time he is still having issues with side effects then we will discontinue the sotalol and restart cardizem at 120mg . Patient was agreeable to this plan.   Patient called back in today 4/29 stating cutting back sotalol did not help with side effects.  Will stop sotalol and restart cardizem to 120mg  BID.  Patient will call back early next week to let me know how this is helping.

## 2014-09-27 MED ORDER — DILTIAZEM HCL ER COATED BEADS 120 MG PO CP24: 120.0000 mg | ORAL_CAPSULE | Freq: Two times a day (BID) | ORAL | Status: DC

## 2014-10-02 ENCOUNTER — Encounter: Payer: Self-pay | Admitting: Internal Medicine

## 2014-10-04 ENCOUNTER — Encounter: Payer: Self-pay | Admitting: Internal Medicine

## 2014-10-04 ENCOUNTER — Telehealth: Payer: Self-pay | Admitting: Cardiology

## 2014-10-04 ENCOUNTER — Other Ambulatory Visit: Payer: Self-pay | Admitting: Internal Medicine

## 2014-10-04 LAB — CUP PACEART REMOTE DEVICE CHECK: MDC IDC SESS DTM: 20160408080500

## 2014-10-04 NOTE — Telephone Encounter (Signed)
Expand All Collapse All   Spoke with the pts dentist office Dr Soledad Gerlach and informed them that per Dr Meda Coffee there are no contraindications from a cardiac standpoint for any dental procedure, and if Dr Soledad Gerlach requires the pt to stop Xarelto, he should hold the morning of the procedure and restart the same night.  Will fax a letter as requested to Dr Winfred Burn office at [phone].     Informed patient of above note from August 02, 2014. Patient verbalized understanding and appreciation for return phone call.

## 2014-10-04 NOTE — Telephone Encounter (Addendum)
New message      Request for surgical clearance:  What type of surgery is being performed? filling 1. When is this surgery scheduled?  10-07-14  Are there any medications that need to be held prior to surgery and how long? xarelto 2. Name of physician performing surgery? [facility]-----can't think of dr name  3. What is your office phone and fax number? Phone # [phone] 4. Pt states the dentist called our office and got clearance but no one called to tell him what to do regarding holding xarelto.  Dentist office is closed on friday

## 2014-10-07 ENCOUNTER — Ambulatory Visit (INDEPENDENT_AMBULATORY_CARE_PROVIDER_SITE_OTHER): Payer: 59 | Admitting: *Deleted

## 2014-10-07 DIAGNOSIS — I480 Paroxysmal atrial fibrillation: Secondary | ICD-10-CM | POA: Diagnosis not present

## 2014-10-08 ENCOUNTER — Ambulatory Visit: Payer: Self-pay | Admitting: Cardiology

## 2014-10-10 ENCOUNTER — Encounter: Payer: Self-pay | Admitting: Cardiology

## 2014-10-10 ENCOUNTER — Ambulatory Visit (INDEPENDENT_AMBULATORY_CARE_PROVIDER_SITE_OTHER): Payer: Commercial Managed Care - HMO | Admitting: Cardiology

## 2014-10-10 VITALS — BP 127/92 | HR 74 | Ht 74.0 in | Wt 198.6 lb

## 2014-10-10 DIAGNOSIS — Z95818 Presence of other cardiac implants and grafts: Secondary | ICD-10-CM

## 2014-10-10 DIAGNOSIS — I10 Essential (primary) hypertension: Secondary | ICD-10-CM | POA: Diagnosis not present

## 2014-10-10 DIAGNOSIS — F1010 Alcohol abuse, uncomplicated: Secondary | ICD-10-CM

## 2014-10-10 DIAGNOSIS — I480 Paroxysmal atrial fibrillation: Secondary | ICD-10-CM | POA: Diagnosis not present

## 2014-10-10 DIAGNOSIS — E781 Pure hyperglyceridemia: Secondary | ICD-10-CM

## 2014-10-10 DIAGNOSIS — E785 Hyperlipidemia, unspecified: Secondary | ICD-10-CM | POA: Diagnosis not present

## 2014-10-10 DIAGNOSIS — K852 Alcohol induced acute pancreatitis without necrosis or infection: Secondary | ICD-10-CM

## 2014-10-10 NOTE — Progress Notes (Signed)
Patient ID: Evan [name], male   DOB: 12-10-1965, 49 y.o.   MRN: [id number]    Electrophysiology Office Note   Date:  10/10/2014   ID:  Evan, [name] August 24, 1965, MRN [id number]  PCP:  Evan Kroner, MD  Cardiologist:  Dr Evan [name] Primary Electrophysiologist: Evan Spark, MD    CC: palpitations, post ER visit follow up   History of Present Illness: Evan [name] is a 49 y.o. male who has been follow by Dr Evan [name] and Evan [name] in the last year, he underwent a-fib ablation followed by frequent episodes of palpitations associated with heavy daily etoh use. The patient was hospitalized in March 2016 when Sotalol was initiated in addition to the cardizem for rhythm control. He has developed asymptomatic bradycardias down to 40 and several medication changes have been done, eventually on Cardizem and metoprolol PRN for a-fib episodes.   He obtained loop monitor that shows an episode almost every 2-3 days. H presented to the ER on 10/04/2014 with profound fatigue, poor appetite, nausea, and was diagnosed with pancreatitis. He states that he stopped drinking then. He feels better now, improved fatigue, only one episode of a-fib since then, terminated by metoprolol.   Past Medical History  Diagnosis Date  . Hypertension   . Anxiety   . Psoriasis   . Atrial fibrillation     a. PVI 04/2014 Dr Evan [name] b. failed medical therapy with Multaq  . Atrial flutter     a. CTI ablation 04/2014 Dr Evan [name]   Past Surgical History  Procedure Laterality Date  . Hernia repair Right     with mesh  . Carpal tunnel release    . Tee without cardioversion [name] 05/06/2014    Procedure: TRANSESOPHAGEAL ECHOCARDIOGRAM (TEE);  Surgeon: Evan Casino, MD;  Location: [facility] ENDOSCOPY;  Service: Cardiovascular;  Laterality: [name];  . Atrial fibrillation ablation [name] 05/07/2014    PVI and CTI by Dr Evan [name]  . Loop recorder implant [name] 08/07/2014    Procedure: LOOP RECORDER IMPLANT;  Surgeon: Evan Grayer, MD;  Location:  [facility] CATH LAB;  Service: Cardiovascular;  Laterality: [name];     Current Outpatient Prescriptions  Medication Sig Dispense Refill  . clobetasol ointment (TEMOVATE) 2.83 % Apply 1 application topically 2 (two) times daily as needed (itching).    Marland Kitchen diltiazem (CARDIZEM CD) 120 MG 24 hr capsule Take 1 capsule (120 mg total) by mouth 2 (two) times daily. 30 capsule 1  . lisinopril (PRINIVIL,ZESTRIL) 5 MG tablet Take 1 tablet (5 mg total) by mouth daily. 90 tablet 3  . LORazepam (ATIVAN) 0.5 MG tablet Take 1 tablet by mouth daily as needed for anxiety    . metoprolol tartrate (LOPRESSOR) 25 MG tablet Take 25-50 mg by mouth daily as needed (onset of adfib). Take 1- 2 tablets by mouth if pt has onset of afib or if heartrate goes pass 100    . Multiple Vitamin (MULTIVITAMIN WITH MINERALS) TABS Take 1 tablet by mouth daily.    . potassium gluconate 595 MG TABS tablet Take 2 tablets by mouth daily 180 tablet 3  . XARELTO 20 MG TABS tablet TAKE ONE TABLET BY MOUTH EVERY DAY with SUPPER 30 tablet 0  . zinc gluconate 50 MG tablet Take 50 mg by mouth daily.     No current facility-administered medications for this visit.    Allergies:   Adhesive and Eliquis   Social History:  The patient  reports that he has quit smoking. His smoking use included  Cigarettes and Cigars. He does not have any smokeless tobacco history on file. He reports that he drinks alcohol. He reports that he does not use illicit drugs.   Family History:  The patient's  family history includes Diabetes in his mother; Heart attack in his brother; Heart failure in his mother; Hypertension in his mother.    ROS:  Please see the history of present illness.   All other systems are reviewed and negative.    PHYSICAL EXAM: VS:  BP 127/92 mmHg  Pulse 74  Ht 6\' 2"  (1.88 m)  Wt 198 lb 9.6 oz (90.084 kg)  BMI 25.49 kg/m2 , BMI Body mass index is 25.49 kg/(m^2). GEN: Well nourished, well developed, in no acute distress HEENT: normal Neck: no  JVD, carotid bruits, or masses Cardiac: RRR; no murmurs, rubs, or gallops,no edema  Respiratory:  clear to auscultation bilaterally, normal work of breathing GI: soft, nontender, nondistended, + BS MS: no deformity or atrophy Skin: warm and dry  Neuro:  Strength and sensation are intact Psych: euthymic mood, full affect  EKG:  EKG is ordered today. The ekg ordered today shows sinus rhythm, normal ekg   Recent Labs: 01/24/2014: TSH 0.75 02/16/2014: ALT 29 03/19/2014: Magnesium 1.8 08/13/2014: BUN 10; Creatinine 0.81; Hemoglobin 15.1; Platelets 275; Potassium 4.0; Sodium 137    Lipid Panel  No results found for: CHOL, TRIG, HDL, CHOLHDL, VLDL, LDLCALC, LDLDIRECT   Wt Readings from Last 3 Encounters:  10/10/14 198 lb 9.6 oz (90.084 kg)  08/15/14 199 lb (90.266 kg)  08/07/14 197 lb (89.359 kg)      ASSESSMENT AND PLAN:  1.  Paroxysmal atrial fibrillation/ atrial flutter, palpitations Continues to have frequent episodes, hopefully he will stay away from etoh, for now we will continue the same dose of cardizem CD 120 mg PO daily and metoprolol PRN.  No more bradycardias.  2. HTN Stable No change required today  3. Hyperlipidemia - severely elevated TAG - 600 with elevated LFTs, with pancreatitis, no therapy right now, we will recheck LFTs and TG in 2 weeks if normalized LFTs and still elevated TG, we will start statins.   Follow up with Dr Evan [name] in 1 months, labs including CMP, CBC, Lipids in 2 weeks, follow up with me in 4 months.  This is add on post hospitalization visit, extra time spend reviewing ER records and Lopp monitor events.  Evan Blacker, MD  10/10/2014 1:46 PM     [place] [address] [phone] (office) [phone] (fax)

## 2014-10-10 NOTE — Patient Instructions (Signed)
Medication Instructions:   Your physician recommends that you continue on your current medications as directed. Please refer to the Current Medication list given to you today.    Labwork:  IN 2 - 3 WEEKS TO CHECK-----CMET, LIPIDS, CBC W DIFF, MAGNESIUM----PLEASE COME FASTING TO THIS APPOINTMENT      Follow-Up:   4 MONTHS WITH DR Meda Coffee

## 2014-10-11 NOTE — Progress Notes (Signed)
Loop recorder 

## 2014-10-14 ENCOUNTER — Encounter: Payer: Self-pay | Admitting: Internal Medicine

## 2014-10-15 ENCOUNTER — Encounter: Payer: Self-pay | Admitting: Internal Medicine

## 2014-10-17 ENCOUNTER — Encounter: Payer: Self-pay | Admitting: Cardiology

## 2014-10-24 ENCOUNTER — Encounter: Payer: Self-pay | Admitting: Cardiology

## 2014-10-24 ENCOUNTER — Other Ambulatory Visit (INDEPENDENT_AMBULATORY_CARE_PROVIDER_SITE_OTHER): Payer: 59 | Admitting: *Deleted

## 2014-10-24 DIAGNOSIS — E785 Hyperlipidemia, unspecified: Secondary | ICD-10-CM

## 2014-10-24 DIAGNOSIS — I10 Essential (primary) hypertension: Secondary | ICD-10-CM

## 2014-10-24 DIAGNOSIS — I480 Paroxysmal atrial fibrillation: Secondary | ICD-10-CM

## 2014-10-24 LAB — CBC WITH DIFFERENTIAL/PLATELET
Basophils Absolute: 0 10*3/uL (ref 0.0–0.1)
Basophils Relative: 0.6 % (ref 0.0–3.0)
Eosinophils Absolute: 0.2 10*3/uL (ref 0.0–0.7)
Eosinophils Relative: 4.1 % (ref 0.0–5.0)
HCT: 42.8 % (ref 39.0–52.0)
Hemoglobin: 14.6 g/dL (ref 13.0–17.0)
Lymphocytes Relative: 25.7 % (ref 12.0–46.0)
Lymphs Abs: 1.2 10*3/uL (ref 0.7–4.0)
MCHC: 34.2 g/dL (ref 30.0–36.0)
MCV: 101.1 fl — ABNORMAL HIGH (ref 78.0–100.0)
Monocytes Absolute: 0.4 10*3/uL (ref 0.1–1.0)
Monocytes Relative: 9.4 % (ref 3.0–12.0)
Neutro Abs: 2.9 10*3/uL (ref 1.4–7.7)
Neutrophils Relative %: 60.2 % (ref 43.0–77.0)
Platelets: 304 10*3/uL (ref 150.0–400.0)
RBC: 4.24 Mil/uL (ref 4.22–5.81)
RDW: 13.2 % (ref 11.5–15.5)
WBC: 4.8 10*3/uL (ref 4.0–10.5)

## 2014-10-24 LAB — COMPREHENSIVE METABOLIC PANEL
ALT: 36 U/L (ref 0–53)
AST: 35 U/L (ref 0–37)
Albumin: 4.4 g/dL (ref 3.5–5.2)
Alkaline Phosphatase: 66 U/L (ref 39–117)
BUN: 17 mg/dL (ref 6–23)
CO2: 29 mEq/L (ref 19–32)
Calcium: 9.4 mg/dL (ref 8.4–10.5)
Chloride: 102 mEq/L (ref 96–112)
Creatinine, Ser: 0.98 mg/dL (ref 0.40–1.50)
GFR: 85.77 mL/min (ref >60.00)
Glucose, Bld: 93 mg/dL (ref 70–99)
Potassium: 4 mEq/L (ref 3.5–5.1)
Sodium: 137 mEq/L (ref 135–145)
Total Bilirubin: 0.5 mg/dL (ref 0.2–1.2)
Total Protein: 7.2 g/dL (ref 6.0–8.3)

## 2014-10-24 LAB — LIPID PANEL
Cholesterol: 148 mg/dL (ref 0–200)
HDL: 56 mg/dL (ref >39.00)
LDL Cholesterol: 77 mg/dL (ref 0–99)
NonHDL: 92
Total CHOL/HDL Ratio: 3
Triglycerides: 73 mg/dL (ref 0.0–149.0)
VLDL: 14.6 mg/dL (ref 0.0–40.0)

## 2014-10-24 LAB — MAGNESIUM: Magnesium: 1.9 mg/dL (ref 1.5–2.5)

## 2014-10-29 LAB — CUP PACEART REMOTE DEVICE CHECK: Date Time Interrogation Session: 20160507080500

## 2014-10-31 ENCOUNTER — Encounter: Payer: Self-pay | Admitting: Internal Medicine

## 2014-11-04 ENCOUNTER — Encounter: Payer: Self-pay | Admitting: Internal Medicine

## 2014-11-04 ENCOUNTER — Ambulatory Visit (INDEPENDENT_AMBULATORY_CARE_PROVIDER_SITE_OTHER): Payer: 59 | Admitting: Internal Medicine

## 2014-11-04 ENCOUNTER — Encounter: Payer: Self-pay | Admitting: *Deleted

## 2014-11-04 VITALS — BP 128/82 | HR 77 | Ht 74.0 in | Wt 194.2 lb

## 2014-11-04 DIAGNOSIS — R0609 Other forms of dyspnea: Secondary | ICD-10-CM

## 2014-11-04 DIAGNOSIS — I10 Essential (primary) hypertension: Secondary | ICD-10-CM

## 2014-11-04 DIAGNOSIS — I480 Paroxysmal atrial fibrillation: Secondary | ICD-10-CM

## 2014-11-04 DIAGNOSIS — R0600 Dyspnea, unspecified: Secondary | ICD-10-CM

## 2014-11-04 LAB — CUP PACEART INCLINIC DEVICE CHECK
Date Time Interrogation Session: 20160606121737
MDC IDC SET ZONE DETECTION INTERVAL: 4500 ms
Zone Setting Detection Interval: 2000 ms
Zone Setting Detection Interval: 330 ms

## 2014-11-04 MED ORDER — FLECAINIDE ACETATE 50 MG PO TABS: 50.0000 mg | ORAL_TABLET | Freq: Two times a day (BID) | ORAL | Status: DC

## 2014-11-04 NOTE — Progress Notes (Signed)
Electrophysiology Office Note   Date:  11/04/2014   ID:  Evan [name], Evan [name] 1966/02/13, MRN [id number]  PCP:  Gara Kroner, MD  Cardiologist:  Dr Meda Coffee Primary Electrophysiologist: Thompson Grayer, MD    Chief Complaint  Patient presents with  . PAF     History of Present Illness: Evan [name] is a 49 y.o. male who presents today for electrophysiology evaluation.   He continues to have episodes of afib, mostly at night.  ILR Interrogation today reveals af burden to be 3.8%.  V rates are mostly controlled.  He reports that episodes are alarming and wake him from sleep.  He does not know if he snores but does not feel well resting upon waking. He has SOB with moderate activity.  He also has rare chest discomfort.  Today, he denies symptoms of  orthopnea, PND, lower extremity edema, claudication, dizziness, presyncope, syncope, bleeding, or neurologic sequela. The patient is tolerating medications without difficulties and is otherwise without complaint today.    Past Medical History  Diagnosis Date  . Hypertension   . Anxiety   . Psoriasis   . Atrial fibrillation     a. PVI 04/2014 Dr Rayann Heman b. failed medical therapy with Multaq  . Atrial flutter     a. CTI ablation 04/2014 Dr Rayann Heman   Past Surgical History  Procedure Laterality Date  . Hernia repair Right     with mesh  . Carpal tunnel release    . Tee without cardioversion [name] 05/06/2014    Procedure: TRANSESOPHAGEAL ECHOCARDIOGRAM (TEE);  Surgeon: Pixie Casino, MD;  Location: [facility] ENDOSCOPY;  Service: Cardiovascular;  Laterality: [name];  . Atrial fibrillation ablation [name] 05/07/2014    PVI and CTI by Dr Rayann Heman  . Loop recorder implant [name] 08/07/2014    Procedure: LOOP RECORDER IMPLANT;  Surgeon: Thompson Grayer, MD;  Location: [facility] CATH LAB;  Service: Cardiovascular;  Laterality: [name];     Current Outpatient Prescriptions  Medication Sig Dispense Refill  . citalopram (CELEXA) 20 MG tablet Take 1 tablet by mouth daily.  3  .  clobetasol ointment (TEMOVATE) 9.79 % Apply 1 application topically 2 (two) times daily as needed (itching).    Marland Kitchen diltiazem (DILACOR XR) 120 MG 24 hr capsule Take 120 mg by mouth daily.    . fluocinonide cream (LIDEX) 8.92 % Use 1 application to affected areas twice daily for 2 to 4 weeks  0  . lisinopril (PRINIVIL,ZESTRIL) 5 MG tablet Take 1 tablet (5 mg total) by mouth daily. 90 tablet 3  . metoprolol tartrate (LOPRESSOR) 25 MG tablet Take 25-50 mg by mouth daily as needed (onset of adfib). Take 1- 2 tablets by mouth if pt has onset of afib or if heartrate goes pass 100    . Multiple Vitamin (MULTIVITAMIN WITH MINERALS) TABS Take 1 tablet by mouth daily.    . potassium gluconate 595 MG TABS tablet Take 595 mg by mouth daily.    Alveda Reasons 20 MG TABS tablet TAKE ONE TABLET BY MOUTH EVERY DAY with SUPPER 30 tablet 0  . zinc gluconate 50 MG tablet Take 50 mg by mouth daily.     No current facility-administered medications for this visit.    Allergies:   Adhesive and Eliquis   Social History:  The patient  reports that he has quit smoking. His smoking use included Cigarettes and Cigars. He does not have any smokeless tobacco history on file. He reports that he drinks alcohol. He reports that he  does not use illicit drugs.   Family History:  The patient's  family history includes Diabetes in his mother; Heart attack in his brother; Heart failure in his mother; Hypertension in his mother.    ROS:  Please see the history of present illness.   All other systems are reviewed and negative.    PHYSICAL EXAM: VS:  BP 128/82 mmHg  Pulse 77  Ht 6\' 2"  (1.88 m)  Wt 88.089 kg (194 lb 3.2 oz)  BMI 24.92 kg/m2 , BMI Body mass index is 24.92 kg/(m^2). GEN: Well nourished, well developed, in no acute distress HEENT: normal Neck: no JVD, carotid bruits, or masses Cardiac: RRR; no murmurs, rubs, or gallops,no edema  Respiratory:  clear to auscultation bilaterally, normal work of breathing GI: soft,  nontender, nondistended, + BS MS: no deformity or atrophy Skin: warm and dry  Neuro:  Strength and sensation are intact Psych: euthymic mood, full affect  ILR interrogation: revleals AF burden to be 3.8%  Recent Labs: 01/24/2014: TSH 0.75 10/24/2014: ALT 36; BUN 17; Creatinine 0.98; Hemoglobin 14.6; Magnesium 1.9; Platelets 304.0; Potassium 4.0; Sodium 137    Lipid Panel     Component Value Date/Time   CHOL 148 10/24/2014 1038   TRIG 73.0 10/24/2014 1038   HDL 56.00 10/24/2014 1038   CHOLHDL 3 10/24/2014 1038   VLDL 14.6 10/24/2014 1038   LDLCALC 77 10/24/2014 1038     Wt Readings from Last 3 Encounters:  11/04/14 88.089 kg (194 lb 3.2 oz)  10/10/14 90.084 kg (198 lb 9.6 oz)  08/15/14 90.266 kg (199 lb)      Other studies Reviewed: Additional studies/ records that were reviewed today include: Dr Thera Flake notes, prior echo  Review of the above records today demonstrates: preserved EF, no CAD.    ASSESSMENT AND PLAN:  1.  Paroxysmal atrial fibrillation Continues to have intermittent afib.  Will add low dose flecainide.  Continue xarelto (Chads2vac score is 1) Given noctural afib and fatigue in am, will order a sleep study. Therapeutic strategies for afib including medicine and repeat ablation were discussed in detail with the patient today. Risk, benefits, and alternatives to EP study and radiofrequency ablation were also discussed in detail today. These risks include but are not limited to stroke, bleeding, vascular damage, tamponade, perforation, damage to the esophagus, lungs, and other structures, pulmonary vein stenosis, worsening renal function, and death. The patient understands these risk and wishes to proceed.  We will therefore proceed with catheter ablation at the next available time.  Given paroxysmal atrial fib, will plan to evaluate LA appendage with cardiac CT rather than TEE.  Will also evaluate pulmonary veins with Cardiac CT given SOB and prior ablation.  2.  SOB Cardiac CT as above to evaluate for pulmonary vein stenosis prior to repeat ablation  3. HTN Stable No change required today   Current medicines are reviewed at length with the patient today.   The patient does not have concerns regarding his medicines.  The following changes were made today:  none  Signed, Thompson Grayer, MD  11/04/2014 10:08 AM     [facility] [address] [place] [address] [phone] (office) [phone] (fax)

## 2014-11-04 NOTE — Patient Instructions (Addendum)
Medication Instructions:  Your physician has recommended you make the following change in your medication:  1) Start Flecainide 50 mg twice daily    Labwork: Your physician recommends that you return for lab work on 12/17/14  Do not have to fast    Testing/Procedures: Your physician has recommended that you have a sleep study. This test records several body functions during sleep, including: brain activity, eye movement, oxygen and carbon dioxide blood levels, heart rate and rhythm, breathing rate and rhythm, the flow of air through your mouth and nose, snoring, body muscle movements, and chest and belly movement.  Your physician has requested that you have cardiac CT. Cardiac computed tomography (CT) is a painless test that uses an x-ray machine to take clear, detailed pictures of your heart. For further information please visit [facility]. Please follow instruction sheet as given.---Dr Meda Coffee to read     Follow-Up:  Your physician recommends that you schedule a follow-up appointment in 4 weeks from 12/24/14 with Roderic Palau, NP and 3 months from 12/24/14 with Dr. Rayann Heman  Any Other Special Instructions Will Be Listed Below (If Applicable).  See handouts on both procedures

## 2014-11-05 ENCOUNTER — Encounter: Payer: Self-pay | Admitting: Internal Medicine

## 2014-11-05 ENCOUNTER — Ambulatory Visit (INDEPENDENT_AMBULATORY_CARE_PROVIDER_SITE_OTHER): Payer: 59 | Admitting: *Deleted

## 2014-11-05 DIAGNOSIS — I480 Paroxysmal atrial fibrillation: Secondary | ICD-10-CM

## 2014-11-07 ENCOUNTER — Encounter: Payer: Self-pay | Admitting: Internal Medicine

## 2014-11-07 NOTE — Progress Notes (Signed)
Loop recorder 

## 2014-11-09 ENCOUNTER — Other Ambulatory Visit: Payer: Self-pay | Admitting: Internal Medicine

## 2014-11-11 ENCOUNTER — Encounter: Payer: Self-pay | Admitting: Internal Medicine

## 2014-11-12 ENCOUNTER — Telehealth: Payer: Self-pay | Admitting: Cardiology

## 2014-11-12 LAB — CUP PACEART REMOTE DEVICE CHECK: Date Time Interrogation Session: 20160613080500

## 2014-11-12 NOTE — Telephone Encounter (Signed)
Patient states that HR was 148 bpm, BP was 149/101 at time of the episode, pt took Metoprolol, CP "eased off" after 15 mins. Pt described CP as an aching feeling in center of chest, no radiation.  Asked patient to send manual transmission. Patient encouraged to go to ER if he has anymore episodes of CP.

## 2014-11-12 NOTE — Telephone Encounter (Signed)
Pt did not have sharp pain. Pain was an "ache." Pain was to right of center of chest. Pt states aches last 30-45min. Yesterday's episode is 1st time it has occurred in years. Transmission does show 105min AF episode at 17:31. Avg V-rate was 146bpm.   PVI ablation scheduled for 12/24/14 & ROV w/ Donna/afib clinic 01/20/15.   Dr. Meda Coffee not available prior to pt's ablation. Pt requesting to see Roderic Palau since Dr. Meda Coffee not available. Pt aware his pain may have etiology other than arrhythmia.   Appt made w/ afib clinic 11/13/14 2:00pm.

## 2014-11-12 NOTE — Telephone Encounter (Signed)
New message      Pt c/o of Chest Pain: STAT if CP now or developed within 24 hours  1. Are you having CP right now? no  2. Are you experiencing any other symptoms (ex. SOB, nausea, vomiting, sweating)?  no 3. How long have you been experiencing CP?  Pt had chest pain yesterday for about 51min to 1hr  Between 5-6pm  4. Is your CP continuous or coming and going?  steady  5. Have you taken Nitroglycerin? no Pt has a loop recorder.  He forgot to mark the chest pain episode.  Can you look at his strips from yesterday and see if anything was recorded?

## 2014-11-13 ENCOUNTER — Ambulatory Visit (HOSPITAL_COMMUNITY)
Admission: RE | Admit: 2014-11-13 | Discharge: 2014-11-13 | Disposition: A | Discharge status: Home / Self Care | Payer: 59 | Source: Ambulatory Visit | Attending: Nurse Practitioner | Admitting: Nurse Practitioner

## 2014-11-13 ENCOUNTER — Encounter (HOSPITAL_COMMUNITY): Payer: Self-pay | Admitting: Nurse Practitioner

## 2014-11-13 VITALS — BP 128/86 | HR 81 | Ht 74.0 in | Wt 192.2 lb

## 2014-11-13 DIAGNOSIS — I4891 Unspecified atrial fibrillation: Secondary | ICD-10-CM | POA: Insufficient documentation

## 2014-11-13 DIAGNOSIS — I480 Paroxysmal atrial fibrillation: Secondary | ICD-10-CM | POA: Diagnosis not present

## 2014-11-13 NOTE — Progress Notes (Signed)
Patient ID: Algis Liming, male   DOB: 1965-11-19, 49 y.o.   MRN: [id number]         Date:  11/13/2014   ID:  Algis Liming, DOB 26-Apr-1966, MRN [id number]  PCP:  Gara Kroner, MD  Cardiologist:  Dr Meda Coffee Primary Electrophysiologist: Dr. Rayann Heman  Chief Complaint  Patient presents with  . Atrial Fibrillation     History of Present Illness: VA BROADWELL is a 49 y.o. male who presents today for evaluation in the afib clinic.    He continues to have episodes of afib, mostly at night.  ILR Interrogation recently revealed af burden to be 3.8%.  V rates are mostly controlled.  He reports that episodes are alarming and wake him from sleep.  He does not know if he snores but does not feel well resting upon waking.He has SOB with moderate activity.  He also has rare chest discomfort. He saw Dr. Rayann Heman 6/6 and because of the increase in afib burden, it was decided to undergo a redo ablation. This is scheduled for 7/26. He was started on flecainide 50 mg bid. He had an episode of chest pain, "burning" last Wednesday that was associated with an episode of afib that was confirmed by Linq report. The pain was centered mid chest without radiation and wore off within 45 mins. The afib episode was non sustained.  He has not had any further chest pain episodes and walks daily without any exertional symptoms.He had a stress test last September, mildly abnormal without any indication for a cardiac cath. He had an episode like these several years ago and had a cardiac workup without any abnormal findings.  Today, he denies symptoms of  orthopnea, PND, lower extremity edema, claudication, dizziness, presyncope, syncope, bleeding, or neurologic sequela. The patient is tolerating medications without difficulties and is otherwise without complaint today.    Past Medical History  Diagnosis Date  . Hypertension   . Anxiety   . Psoriasis   . Atrial fibrillation     a. PVI 04/2014 Dr Rayann Heman b. failed medical  therapy with Multaq  . Atrial flutter     a. CTI ablation 04/2014 Dr Rayann Heman   Past Surgical History  Procedure Laterality Date  . Hernia repair Right     with mesh  . Carpal tunnel release    . Tee without cardioversion [name] 05/06/2014    Procedure: TRANSESOPHAGEAL ECHOCARDIOGRAM (TEE);  Surgeon: Pixie Casino, MD;  Location: [facility] ENDOSCOPY;  Service: Cardiovascular;  Laterality: [name];  . Atrial fibrillation ablation [name] 05/07/2014    PVI and CTI by Dr Rayann Heman  . Loop recorder implant [name] 08/07/2014    Procedure: LOOP RECORDER IMPLANT;  Surgeon: Thompson Grayer, MD;  Location: [facility] CATH LAB;  Service: Cardiovascular;  Laterality: [name];     Current Outpatient Prescriptions  Medication Sig Dispense Refill  . citalopram (CELEXA) 20 MG tablet Take 1 tablet by mouth daily.  3  . diltiazem (DILACOR XR) 120 MG 24 hr capsule Take 120 mg by mouth daily.    . flecainide (TAMBOCOR) 50 MG tablet Take 1 tablet (50 mg total) by mouth 2 (two) times daily. 180 tablet 3  . lisinopril (PRINIVIL,ZESTRIL) 5 MG tablet Take 1 tablet (5 mg total) by mouth daily. 90 tablet 3  . metoprolol tartrate (LOPRESSOR) 25 MG tablet Take 25-50 mg by mouth daily as needed (onset of adfib). Take 1- 2 tablets by mouth if pt has onset of afib or if heartrate goes pass 100    .  Multiple Vitamin (MULTIVITAMIN WITH MINERALS) TABS Take 1 tablet by mouth daily.    . potassium gluconate 595 MG TABS tablet Take 595 mg by mouth daily.    Alveda Reasons 20 MG TABS tablet TAKE ONE TABLET BY MOUTH EVERY DAY with SUPPER 30 tablet 3  . zinc gluconate 50 MG tablet Take 50 mg by mouth daily.    . clobetasol ointment (TEMOVATE) 9.39 % Apply 1 application topically 2 (two) times daily as needed (itching).    . fluocinonide cream (LIDEX) 0.30 % Use 1 application to affected areas twice daily for 2 to 4 weeks  0   No current facility-administered medications for this encounter.    Allergies:   Adhesive and Eliquis   Social History:  The patient  reports  that he has quit smoking. His smoking use included Cigarettes and Cigars. He does not have any smokeless tobacco history on file. He reports that he drinks alcohol. He reports that he does not use illicit drugs.   Family History:  The patient's  family history includes Diabetes in his mother; Heart attack in his brother; Heart failure in his mother; Hypertension in his mother.    ROS:  Please see the history of present illness.   All other systems are reviewed and negative.    PHYSICAL EXAM: VS:  BP 128/86 mmHg  Pulse 81  Ht 6\' 2"  (1.88 m)  Wt 192 lb 3.2 oz (87.181 kg)  BMI 24.67 kg/m2 , BMI Body mass index is 24.67 kg/(m^2). GEN: Well nourished, well developed, in no acute distress HEENT: normal Neck: no JVD, carotid bruits, or masses Cardiac: RRR; no murmurs, rubs, or gallops,no edema  Respiratory:  clear to auscultation bilaterally, normal work of breathing GI: soft, nontender, nondistended, + BS MS: no deformity or atrophy Skin: warm and dry  Neuro:  Strength and sensation are intact Psych: euthymic mood, full affect  EKG NSR, normal EKG. PR 164 ms, QRS 76 ms, QTc 436 ms    Wt Readings from Last 3 Encounters:  11/13/14 192 lb 3.2 oz (87.181 kg)  11/04/14 194 lb 3.2 oz (88.089 kg)  10/10/14 198 lb 9.6 oz (90.084 kg)      Other studies Reviewed: Additional studies/ records that were reviewed today include: Dr Thera Flake notes, prior echo , prior stress test Review of the above records today demonstrates: preserved EF, no CAD.    ASSESSMENT AND PLAN:  1.  Paroxysmal atrial fibrillation On flecainide 50 mg bid and pt feels like afib burden is down. Continue current therapy with afib ablation redo pending in July  2. Chest pain X one episode, resolved. Does not sound ischemic in nature, possibly related to afib epsiode.    3. SOB Cardiac CT as planned per Dr. Rayann Heman to evaluate for pulmonary vein stenosis prior to repeat ablation  3. HTN Stable No change required  today   F/u with Dr. Rayann Heman, Dr. Meda Coffee, Afib clinic as scheduled, sooner if repeat episodes chest pain.  Eduard Roux, NP  11/13/2014 2:33 PM      [phone] (fax)

## 2014-11-26 ENCOUNTER — Encounter: Payer: Self-pay | Admitting: Internal Medicine

## 2014-11-28 ENCOUNTER — Emergency Department (HOSPITAL_COMMUNITY)
Admission: EM | Admit: 2014-11-28 | Discharge: 2014-11-28 | Disposition: A | Discharge status: Home / Self Care | Payer: 59 | Attending: Emergency Medicine | Admitting: Emergency Medicine

## 2014-11-28 ENCOUNTER — Emergency Department (HOSPITAL_COMMUNITY): Payer: 59

## 2014-11-28 ENCOUNTER — Encounter (HOSPITAL_COMMUNITY): Payer: Self-pay | Admitting: Emergency Medicine

## 2014-11-28 DIAGNOSIS — I4891 Unspecified atrial fibrillation: Secondary | ICD-10-CM | POA: Insufficient documentation

## 2014-11-28 DIAGNOSIS — R079 Chest pain, unspecified: Secondary | ICD-10-CM | POA: Diagnosis present

## 2014-11-28 DIAGNOSIS — R0789 Other chest pain: Secondary | ICD-10-CM | POA: Diagnosis not present

## 2014-11-28 DIAGNOSIS — R61 Generalized hyperhidrosis: Secondary | ICD-10-CM | POA: Insufficient documentation

## 2014-11-28 DIAGNOSIS — I10 Essential (primary) hypertension: Secondary | ICD-10-CM | POA: Insufficient documentation

## 2014-11-28 DIAGNOSIS — Z87891 Personal history of nicotine dependence: Secondary | ICD-10-CM | POA: Diagnosis not present

## 2014-11-28 DIAGNOSIS — F419 Anxiety disorder, unspecified: Secondary | ICD-10-CM | POA: Insufficient documentation

## 2014-11-28 DIAGNOSIS — I4892 Unspecified atrial flutter: Secondary | ICD-10-CM | POA: Diagnosis not present

## 2014-11-28 DIAGNOSIS — R072 Precordial pain: Secondary | ICD-10-CM | POA: Insufficient documentation

## 2014-11-28 DIAGNOSIS — Z79899 Other long term (current) drug therapy: Secondary | ICD-10-CM | POA: Diagnosis not present

## 2014-11-28 DIAGNOSIS — Z872 Personal history of diseases of the skin and subcutaneous tissue: Secondary | ICD-10-CM | POA: Diagnosis not present

## 2014-11-28 LAB — BASIC METABOLIC PANEL
ANION GAP: 13 (ref 5–15)
CALCIUM: 8.6 mg/dL — AB (ref 8.9–10.3)
CHLORIDE: 97 mmol/L — AB (ref 101–111)
CO2: 28 mmol/L (ref 22–32)
Creatinine, Ser: 0.94 mg/dL (ref 0.61–1.24)
Glucose, Bld: 105 mg/dL — ABNORMAL HIGH (ref 65–99)
Potassium: 3.7 mmol/L (ref 3.5–5.1)
Sodium: 138 mmol/L (ref 135–145)

## 2014-11-28 LAB — CBC
HEMATOCRIT: 40.3 % (ref 39.0–52.0)
HEMOGLOBIN: 14.4 g/dL (ref 13.0–17.0)
MCH: 34.4 pg — AB (ref 26.0–34.0)
MCHC: 35.7 g/dL (ref 30.0–36.0)
MCV: 96.2 fL (ref 78.0–100.0)
PLATELETS: 210 10*3/uL (ref 150–400)
RBC: 4.19 MIL/uL — AB (ref 4.22–5.81)
RDW: 13.4 % (ref 11.5–15.5)
WBC: 5.1 10*3/uL (ref 4.0–10.5)

## 2014-11-28 LAB — I-STAT TROPONIN, ED: Troponin i, poc: 0 ng/mL (ref 0.00–0.08)

## 2014-11-28 LAB — TROPONIN I: Troponin I: <0.03 ng/mL (ref <0.031)

## 2014-11-28 MED ORDER — RIVAROXABAN 20 MG PO TABS
20.0000 mg | ORAL_TABLET | Freq: Every day | ORAL | Status: DC
  Administered 2014-11-28: 20 mg via ORAL
  Filled 2014-11-28: qty 1

## 2014-11-28 MED ORDER — FLECAINIDE ACETATE 50 MG PO TABS
50.0000 mg | ORAL_TABLET | Freq: Two times a day (BID) | ORAL | Status: DC
  Administered 2014-11-28: 50 mg via ORAL
  Filled 2014-11-28: qty 1

## 2014-11-28 NOTE — ED Notes (Signed)
To ED via [facility] from [facility] [address]. With chest pain.

## 2014-11-28 NOTE — ED Provider Notes (Signed)
CSN: [id number]     Arrival date & time 11/28/14  1808 History   First MD Initiated Contact with Patient 11/28/14 1811     Chief Complaint  Patient presents with  . Chest Pain     (Consider location/radiation/quality/duration/timing/severity/associated sxs/prior Treatment) Patient is a 49 y.o. male presenting with chest pain.  Chest Pain Pain location:  R chest Pain quality: aching and sharp   Pain severity:  Moderate Associated symptoms: diaphoresis   Associated symptoms: no abdominal pain, no anxiety, no back pain, no claudication, no cough, no dizziness, no fatigue, no fever, no headache, no nausea, no palpitations, no shortness of breath, no syncope, not vomiting and no weakness   Risk factors: hypertension, male sex and smoking   Risk factors: no diabetes mellitus and no prior DVT/PE     Past Medical History  Diagnosis Date  . Hypertension   . Anxiety   . Psoriasis   . Atrial fibrillation     a. PVI 04/2014 Dr Rayann Heman b. failed medical therapy with Multaq  . Atrial flutter     a. CTI ablation 04/2014 Dr Rayann Heman   Past Surgical History  Procedure Laterality Date  . Hernia repair Right     with mesh  . Carpal tunnel release    . Tee without cardioversion [name] 05/06/2014    Procedure: TRANSESOPHAGEAL ECHOCARDIOGRAM (TEE);  Surgeon: Pixie Casino, MD;  Location: [facility] ENDOSCOPY;  Service: Cardiovascular;  Laterality: [name];  . Atrial fibrillation ablation [name] 05/07/2014    PVI and CTI by Dr Rayann Heman  . Loop recorder implant [name] 08/07/2014    Procedure: LOOP RECORDER IMPLANT;  Surgeon: Thompson Grayer, MD;  Location: [facility] CATH LAB;  Service: Cardiovascular;  Laterality: [name];   Family History  Problem Relation Age of Onset  . Hypertension Mother   . Heart failure Mother   . Diabetes Mother   . Heart attack Brother    History  Substance Use Topics  . Smoking status: Former Smoker    Types: Cigarettes, Cigars  . Smokeless tobacco: Not on file  . Alcohol Use: Yes     Comment: wine  at night to help relax    Review of Systems  Constitutional: Positive for diaphoresis. Negative for fever, chills, appetite change and fatigue.  HENT: Negative for congestion, ear pain, facial swelling, mouth sores and sore throat.   Eyes: Negative for visual disturbance.  Respiratory: Negative for cough, chest tightness and shortness of breath.   Cardiovascular: Positive for chest pain. Negative for palpitations, claudication and syncope.  Gastrointestinal: Negative for nausea, vomiting, abdominal pain, diarrhea and blood in stool.  Endocrine: Negative for cold intolerance and heat intolerance.  Genitourinary: Negative for frequency, decreased urine volume and difficulty urinating.  Musculoskeletal: Negative for back pain and neck stiffness.  Skin: Negative for rash.  Neurological: Negative for dizziness, weakness, light-headedness and headaches.  All other systems reviewed and are negative.     Allergies  Adhesive and Eliquis  Home Medications   Prior to Admission medications   Medication Sig Start Date End Date Taking? Authorizing Provider  citalopram (CELEXA) 20 MG tablet Take 1 tablet by mouth daily. 10/15/14  Yes Historical Provider, MD  diltiazem (CARDIZEM CD) 120 MG 24 hr capsule Take 120 mg by mouth daily. 11/27/14  Yes Historical Provider, MD  EYELID CLEANSERS EX Apply 1 application topically daily.   Yes Historical Provider, MD  flecainide (TAMBOCOR) 50 MG tablet Take 1 tablet (50 mg total) by mouth 2 (two) times daily. 11/04/14  Yes Thompson Grayer, MD  lisinopril (PRINIVIL,ZESTRIL) 5 MG tablet Take 1 tablet (5 mg total) by mouth daily. 07/09/14  Yes Dorothy Spark, MD  Multiple Vitamin (MULTIVITAMIN WITH MINERALS) TABS Take 1 tablet by mouth at bedtime.    Yes Historical Provider, MD  Polyethyl Glycol-Propyl Glycol 0.4-0.3 % SOLN Place 1 drop into both eyes daily as needed (dry eyes).   Yes Historical Provider, MD  potassium gluconate 595 MG TABS tablet Take 595 mg by mouth  daily.   Yes Historical Provider, MD  XARELTO 20 MG TABS tablet TAKE ONE TABLET BY MOUTH EVERY DAY with SUPPER 11/11/14  Yes Thompson Grayer, MD  zinc gluconate 50 MG tablet Take 50 mg by mouth at bedtime.    Yes Historical Provider, MD  clobetasol ointment (TEMOVATE) 4.09 % Apply 1 application topically 2 (two) times daily as needed (itching).    Historical Provider, MD  fluocinonide cream (LIDEX) 8.11 % Use 1 application to affected areas twice daily for 2 to 4 weeks 10/21/14   Historical Provider, MD  metoprolol tartrate (LOPRESSOR) 25 MG tablet Take 25-50 mg by mouth daily as needed (onset of adfib). Take 1- 2 tablets by mouth if pt has onset of afib or if heartrate goes pass 100    Historical Provider, MD   BP 126/81 mmHg  Pulse 77  Temp(Src) 98.5 F (36.9 C) (Oral)  Resp 16  SpO2 98% Physical Exam  Constitutional: He is oriented to person, place, and time. He appears well-nourished. No distress.  HENT:  Head: Normocephalic and atraumatic.  Right Ear: External ear normal.  Left Ear: External ear normal.  Eyes: Pupils are equal, round, and reactive to light. Right eye exhibits no discharge. Left eye exhibits no discharge. No scleral icterus.  Neck: Normal range of motion. Neck supple.  Cardiovascular: Normal rate.  Exam reveals no gallop and no friction rub.   No murmur heard. Pulmonary/Chest: Effort normal and breath sounds normal. No stridor. No respiratory distress. He has no wheezes. He has no rales. He exhibits no tenderness.  Abdominal: Soft. He exhibits no distension and no mass. There is no tenderness. There is no rebound and no guarding.  Musculoskeletal: He exhibits no edema or tenderness.  Neurological: He is alert and oriented to person, place, and time.  Skin: Skin is warm and dry. No rash noted. He is not diaphoretic. No erythema.    ED Course  Procedures (including critical care time) Labs Review Labs Reviewed  BASIC METABOLIC PANEL - Abnormal; Notable for the  following:    Chloride 97 (*)    Glucose, Bld 105 (*)    BUN <5 (*)    Calcium 8.6 (*)    All other components within normal limits  CBC - Abnormal; Notable for the following:    RBC 4.19 (*)    MCH 34.4 (*)    All other components within normal limits  TROPONIN I  I-STAT TROPOININ, ED    Imaging Review Dg Chest 2 View  11/28/2014   CLINICAL DATA:  Chest pain for 1 day  EXAM: CHEST - 2 VIEW  COMPARISON:  08/13/2014  FINDINGS: The heart size and mediastinal contours are within normal limits. Both lungs are clear. The visualized skeletal structures are unremarkable. A loop recorder is noted and stable.  IMPRESSION: No acute abnormality noted.   Electronically Signed   By: Inez Catalina M.D.   On: 11/28/2014 19:04     EKG Interpretation   Date/Time:  Thursday November 28 2014 18:12:33 EDT Ventricular Rate:  80 PR Interval:  173 QRS Duration: 102 QT Interval:  384 QTC Calculation: 443 R Axis:   69 Text Interpretation:  Sinus rhythm Probable left ventricular hypertrophy  Baseline wander in lead(s) V1 V2 V3 V4 V5 V6 Sinus rhythm Left ventricular  hypertrophy Artifact Abnormal ekg Confirmed by Carmin Muskrat  MD [phone])  on 11/28/2014 6:21:31 PM      MDM   49 year old male with a history of hypertension, anxiety, A. fib on flecainide and Xarelto to presents to the ED with right-sided atypical chest pain lasting one hour currently now resolved. Patient reported recent negative stress test. He is being followed by Dr. Meda Coffee. Rest of the history and exam as above. EKG with normal sinus rhythm nonspecific ST changes but no evidence of acute ischemia, arrhythmia, or blocks. Initial troponin negative. CBC without leukocytosis or anemia. BMP without significant electrolyte derangements. Chest x-ray without evidence of pneumonia, pneumothorax, widening or abnormal mediastinal contour, pulmonary edema, pleural effusions. Low clinical suspicion for pulmonary embolism or dissection. Cardiology  consultation and evaluated the patient. Recommended obtaining a delta troponin. If negative patient to follow-up with his cardiologist for further management.  Delta troponin negative. Patient in good condition and safe for discharge with strict return precautions. Patient to follow up with cardiology as above.  Patient seen in conjunction with Dr. Vanita Panda.  Final diagnoses:  Precordial pain        Addison Lank, MD 11/29/14 0201  Carmin Muskrat, MD 11/29/14 2947

## 2014-11-28 NOTE — ED Notes (Signed)
Patient transported to X-ray 

## 2014-11-28 NOTE — ED Notes (Signed)
Waiting on meds to be delivered from pharmacy

## 2014-11-28 NOTE — Discharge Instructions (Signed)

## 2014-11-28 NOTE — Consult Note (Signed)
CARDIOLOGY CONSULT NOTE   Patient ID: Evan [name] MRN: [id number], DOB/AGE: 1966-03-26   Admit date: 11/28/2014 Date of Consult: 11/28/2014   Primary Physician: Gara Kroner, MD Primary Cardiologist: Dr Meda Coffee Primary Electrophysiologist: Thompson Grayer, MD   Pt. Profile  15M with HTN, AF/AFL on riva/flec s/p AF ablation with repeat procedure scheduled who presents with CP.   Problem List  Past Medical History  Diagnosis Date  . Hypertension   . Anxiety   . Psoriasis   . Atrial fibrillation     a. PVI 04/2014 Dr Rayann Heman b. failed medical therapy with Multaq  . Atrial flutter     a. CTI ablation 04/2014 Dr Rayann Heman    Past Surgical History  Procedure Laterality Date  . Hernia repair Right     with mesh  . Carpal tunnel release    . Tee without cardioversion [name] 05/06/2014    Procedure: TRANSESOPHAGEAL ECHOCARDIOGRAM (TEE);  Surgeon: Pixie Casino, MD;  Location: [facility] ENDOSCOPY;  Service: Cardiovascular;  Laterality: [name];  . Atrial fibrillation ablation [name] 05/07/2014    PVI and CTI by Dr Rayann Heman  . Loop recorder implant [name] 08/07/2014    Procedure: LOOP RECORDER IMPLANT;  Surgeon: Thompson Grayer, MD;  Location: [facility] CATH LAB;  Service: Cardiovascular;  Laterality: [name];     Allergies  Allergies  Allergen Reactions  . Adhesive [Tape] Hives, Itching and Dermatitis    From monitor electrodes, even the sensitive skin ones  . Eliquis [Apixaban] Hives, Itching and Dermatitis    Has not confirmed yet per pt (04/22/14)  He believes it was the electrodes causing the issue because it was only underneath where electrodes from the monitor were placed    HPI   Evan [name] is a 49 y.o. male with a past medical history of hypertension and atrial fibrillation/atrial flutter. He is s/p PVI and CTI 04/2014 by Dr Rayann Heman. He has had AF recurrence after ablation and has an implanted MDT LINQ was placed 08-07-14 to evaluate AF recurrence and burden which is currently at 3.8%. He is scheduled  for repeat ablation on 7/26 with Dr. Rayann Heman. He is currently managed on flecainide 50mg  BID and rivaroxaban. He is not on a nodal agent in the setting of bradycardia.  He reports that he has frequent symptomatic AF and some times at rates as high as 250bpm as recent as this week.   He presents today with chest pain. He states that at home, while seated at 4:30pm, he developed 5-6/10 "burning ache" in his right chest with associated tooth and gum ache. He had no associated symptoms. Pain was constant. Pain was not worsened with exercise and was not positional. He thought staying still may have made the pain worse. He states he has been burping a lot, otherwise nothing out the the ordinary recently. He notes that a month ago, he reported a single isolated episode to a provider and he was instructed to get evaluated. He subsequently drove himself to urgent care and was sent to the [facility] ED for further evaluation. Of note, while in the urgent care center, the pain improved almost completely without intervention. EMS gave him 4 ASA and the pain subsided completely.   On arrival to the ER he was hemodynamically stable and CP free. HR 77, 133/76, 100% on RA. ECG demonstraed NSR. Labs were notable for K 3.7, Cr 0.94, POC TnI 0.00. CXR was without acute abnormality.   Of note, myoview 02-06-14 demonstrated EF 47%, very small area  of basal inferoseptal mild ischemia with mild inferior hypokinesis. Echocardiogram 04/2014 demonstrated EF 55-60%, no RWMA, mild MR and TR, LA 42.  Inpatient Medications  . flecainide  50 mg Oral BID  . rivaroxaban  20 mg Oral QPC supper    Family History Family History  Problem Relation Age of Onset  . Hypertension Mother   . Heart failure Mother   . Diabetes Mother   . Heart attack Brother      Social History History   Social History  . Marital Status: Legally Separated    Spouse Name: [name]  . Number of Children: [name]  . Years of Education: [name]   Occupational History  .  Not on file.   Social History Main Topics  . Smoking status: Former Smoker    Types: Cigarettes, Cigars  . Smokeless tobacco: Not on file  . Alcohol Use: Yes     Comment: wine at night to help relax  . Drug Use: No  . Sexual Activity: Not on file   Other Topics Concern  . Not on file   Social History Narrative     Review of Systems  General:  No chills, fever, night sweats or weight changes.  Cardiovascular:  See HPI Dermatological: No rash, lesions/masses Respiratory: No cough, dyspnea Urologic: No hematuria, dysuria Abdominal:   No nausea, vomiting, diarrhea, bright red blood per rectum, melena, or hematemesis Neurologic:  No visual changes, wkns, changes in mental status. All other systems reviewed and are otherwise negative except as noted above.  Physical Exam  Blood pressure 126/83, pulse 75, resp. rate 14, SpO2 97 %.  General: Pleasant, NAD Psych: Normal affect. Neuro: Alert and oriented X 3. Moves all extremities spontaneously. HEENT: Normal  Neck: Supple without bruits or JVD. Lungs:  Resp regular and unlabored, CTA. Heart: RRR no s3, s4, or murmurs. Abdomen: Soft, non-tender, non-distended, BS + x 4.  Extremities: No clubbing, cyanosis or edema. DP/PT/Radials 2+ and equal bilaterally.  Labs  No results for input(s): CKTOTAL, CKMB, TROPONINI in the last 72 hours. Lab Results  Component Value Date   WBC 5.1 11/28/2014   HGB 14.4 11/28/2014   HCT 40.3 11/28/2014   MCV 96.2 11/28/2014   PLT 210 11/28/2014    Recent Labs Lab 11/28/14 1834  NA 138  K 3.7  CL 97*  CO2 28  BUN <5*  CREATININE 0.94  CALCIUM 8.6*  GLUCOSE 105*   Lab Results  Component Value Date   CHOL 148 10/24/2014   HDL 56.00 10/24/2014   LDLCALC 77 10/24/2014   TRIG 73.0 10/24/2014   No results found for: DDIMER  Radiology/Studies  Dg Chest 2 View  11/28/2014   CLINICAL DATA:  Chest pain for 1 day  EXAM: CHEST - 2 VIEW  COMPARISON:  08/13/2014  FINDINGS: The heart size  and mediastinal contours are within normal limits. Both lungs are clear. The visualized skeletal structures are unremarkable. A loop recorder is noted and stable.  IMPRESSION: No acute abnormality noted.   Electronically Signed   By: Inez Catalina M.D.   On: 11/28/2014 19:04    ECG  11/28/14 @ 18:12: NSR. Unchanged compared to 11/13/14.   ASSESSMENT AND PLAN  58M with HTN, AF/AFL on riva/flec s/p AF ablation with repeat procedure scheduled who presents with CP. No AF was documented during these episodes although it is not clear how much if any ECG monitoring occurred at the urgent care. The presenting symptoms have some typical and atypical qualities. ECG  is unchanged and troponin is normal. He has no history of angiogram documented CAD although he does have a nuclear study in Sept 2015 demonstrating a possible very small area of basal inferoseptal mild ischemia with mild inferior hypokinesis. However, a echocardiogram 04/2014 demonstrated EF 55-60% and no RWMA, raising the accuracy of the nuclear study results into question. He is currently chest pain free and became pain free spontaneously (the ASA given by EMS could not have acted that rapidly and his symptoms were almost completely gone once the ASA was administered anyways). He has had no recurrence of his CP. The increased belching raise the question of a GI etiology.   1. Would check 2nd troponin, which will be > 4 hours after CP resolved (5:30pm). 2. If 2nd troponin is negative and he has no CP recurrence, he can be discharged to follow-up and obtain a stress test as an outpatient.  Tobi Bastos, MD 11/28/2014, 8:38 PM

## 2014-12-04 ENCOUNTER — Telehealth: Payer: Self-pay | Admitting: *Deleted

## 2014-12-04 NOTE — Telephone Encounter (Signed)
Pt was having burning/aching pain in center of chest.  Pt had upper right jaw ache simultaneously. Pt went to [facility] ER per Urgent Care for precordial pain.  Pt has appt tomorrow w/ Dr. Meda Coffee.

## 2014-12-05 ENCOUNTER — Ambulatory Visit: Payer: Commercial Managed Care - HMO | Admitting: *Deleted

## 2014-12-05 ENCOUNTER — Encounter: Payer: Self-pay | Admitting: Internal Medicine

## 2014-12-05 ENCOUNTER — Encounter: Payer: Self-pay | Admitting: Cardiology

## 2014-12-05 ENCOUNTER — Ambulatory Visit (INDEPENDENT_AMBULATORY_CARE_PROVIDER_SITE_OTHER): Payer: Commercial Managed Care - HMO | Admitting: Cardiology

## 2014-12-05 VITALS — BP 110/80 | HR 79 | Ht 74.0 in | Wt 193.4 lb

## 2014-12-05 DIAGNOSIS — Z7901 Long term (current) use of anticoagulants: Secondary | ICD-10-CM

## 2014-12-05 DIAGNOSIS — I480 Paroxysmal atrial fibrillation: Secondary | ICD-10-CM | POA: Diagnosis not present

## 2014-12-05 DIAGNOSIS — R0789 Other chest pain: Secondary | ICD-10-CM

## 2014-12-05 DIAGNOSIS — I10 Essential (primary) hypertension: Secondary | ICD-10-CM | POA: Diagnosis not present

## 2014-12-05 MED ORDER — DILTIAZEM HCL ER 120 MG PO CP12: 120.0000 mg | ORAL_CAPSULE | Freq: Two times a day (BID) | ORAL | Status: DC

## 2014-12-05 NOTE — Patient Instructions (Signed)
Medication Instructions:  Your physician has recommended you make the following change in your medication:   Increase Diltiazem  (Cardizem SR) 120 mg by mouth every 12 hours.  Lab work: NONE  Testing/Procedures: NONE  Follow-Up: Your physician recommends that you follow-up as planned with testing and Roderic Palau NP in [facility].    Any Other Special Instructions Will Be Listed Below (If Applicable).

## 2014-12-05 NOTE — Progress Notes (Signed)
Cardiology Office Note   Date:  12/06/2014   ID:  CADELL Evan [name], DOB 1965-10-11, MRN [id number]  PCP:  Evan Kroner, MD  Cardiologist:  Dr. Rayann Heman / Dr. Meda Coffee   Chief Complaint  Patient presents with  . Hospitalization Follow-up    was in ER with chest pain      History of Present Illness: Evan [name] is a 49 y.o. male who presents for follow up after ER visit for chest pain 11/28/14.  He has hx of HTN, AF/AFL on riva/flec s/p AF ablation with repeat procedure scheduled who presented with CP. He described it as a burning ache in his rt chest with radiation to rt jaw ans teeth.  Lasted about 45 min. He initially went to urgent care then to ED.  Pain improved with ASA.  VS were stable, along with EKG.  Troponin remained negative and pt was seen by "Fellow on Call" for cardiology.  Outpt stress test was recommended.   Pt is scheduled for cardiac CT, I checked with Dr. Rayann Heman and he felt the cardiac CT would be best test and if + calcium then may need to proceed with cath.  Pt stated today that he has these same symptoms before and cardiac tests were negative.  Since ER he has had no further pain.  He continues with episodes of rapid a fib episodically.  Pt on flecainide and xarelto.     Past Medical History  Diagnosis Date  . Hypertension   . Anxiety   . Psoriasis   . Atrial fibrillation     a. PVI 04/2014 Dr Rayann Heman b. failed medical therapy with Multaq  . Atrial flutter     a. CTI ablation 04/2014 Dr Rayann Heman    Past Surgical History  Procedure Laterality Date  . Hernia repair Right     with mesh  . Carpal tunnel release    . Tee without cardioversion [name] 05/06/2014    Procedure: TRANSESOPHAGEAL ECHOCARDIOGRAM (TEE);  Surgeon: Pixie Casino, MD;  Location: [facility] ENDOSCOPY;  Service: Cardiovascular;  Laterality: [name];  . Atrial fibrillation ablation [name] 05/07/2014    PVI and CTI by Dr Rayann Heman  . Loop recorder implant [name] 08/07/2014    Procedure: LOOP RECORDER IMPLANT;   Surgeon: Thompson Grayer, MD;  Location: [facility] CATH LAB;  Service: Cardiovascular;  Laterality: [name];     Current Outpatient Prescriptions  Medication Sig Dispense Refill  . citalopram (CELEXA) 20 MG tablet Take 1 tablet by mouth daily.  3  . clobetasol ointment (TEMOVATE) 5.03 % Apply 1 application topically 2 (two) times daily as needed (itching).    . EYELID CLEANSERS EX Apply 1 application topically daily.    . flecainide (TAMBOCOR) 50 MG tablet Take 1 tablet (50 mg total) by mouth 2 (two) times daily. 180 tablet 3  . fluocinonide cream (LIDEX) 5.46 % Use 1 application to affected areas twice daily for 2 to 4 weeks  0  . lisinopril (PRINIVIL,ZESTRIL) 5 MG tablet Take 1 tablet (5 mg total) by mouth daily. 90 tablet 3  . metoprolol tartrate (LOPRESSOR) 25 MG tablet Take 25-50 mg by mouth daily as needed (onset of adfib). Take 1- 2 tablets by mouth if pt has onset of afib or if heartrate goes pass 100    . Multiple Vitamin (MULTIVITAMIN WITH MINERALS) TABS Take 1 tablet by mouth at bedtime.     Evan [name] Glycol-Propyl Glycol 0.4-0.3 % SOLN Place 1 drop into both eyes daily as needed (dry  eyes).    . potassium gluconate 595 MG TABS tablet Take 595 mg by mouth daily.    Alveda Reasons 20 MG TABS tablet TAKE ONE TABLET BY MOUTH EVERY DAY with SUPPER 30 tablet 3  . zinc gluconate 50 MG tablet Take 50 mg by mouth at bedtime.     Marland Kitchen diltiazem (CARDIZEM SR) 120 MG 12 hr capsule Take 1 capsule (120 mg total) by mouth 2 (two) times daily. 180 capsule 3   No current facility-administered medications for this visit.    Allergies:   Adhesive and Eliquis    Social History:  The patient  reports that he has quit smoking. His smoking use included Cigarettes and Cigars. He does not have any smokeless tobacco history on file. He reports that he drinks alcohol. He reports that he does not use illicit drugs.   Family History:  The patient's family history includes Diabetes in his mother; Heart attack in his brother;  Heart failure in his mother; Hypertension in his mother.    ROS:  General:no colds or fevers, no weight changes Skin:no rashes or ulcers HEENT:no blurred vision, no congestion CV:see HPI PUL:see HPI GI:no diarrhea constipation or melena, no indigestion GU:no hematuria, no dysuria MS:no joint pain, no claudication Neuro:no syncope, no lightheadedness Endo:no diabetes, no thyroid disease  Wt Readings from Last 3 Encounters:  12/05/14 193 lb 6.4 oz (87.726 kg)  11/13/14 192 lb 3.2 oz (87.181 kg)  11/04/14 194 lb 3.2 oz (88.089 kg)     PHYSICAL EXAM: VS:  BP 110/80 mmHg  Pulse 79  Ht 6\' 2"  (1.88 m)  Wt 193 lb 6.4 oz (87.726 kg)  BMI 24.82 kg/m2 , BMI Body mass index is 24.82 kg/(m^2). General:Pleasant affect, NAD Skin:Warm and dry, brisk capillary refill HEENT:normocephalic, sclera clear, mucus membranes moist Neck:supple, no JVD, no bruits  Heart:S1S2 RRR without murmur, gallup, rub or click Lungs:clear without rales, rhonchi, or wheezes QQV:ZDGL, non tender, + BS, do not palpate liver spleen or masses Ext:no lower ext edema, 2+ pedal pulses, 2+ radial pulses Neuro:alert and oriented, MAE, follows commands, + facial symmetry    EKG:  EKG is ordered today. The ekg ordered today demonstrates SR rate 79 , normal EKG.     Recent Labs: 01/24/2014: TSH 0.75 10/24/2014: ALT 36; Magnesium 1.9 11/28/2014: BUN <5*; Creatinine, Ser 0.94; Hemoglobin 14.4; Platelets 210; Potassium 3.7; Sodium 138    Lipid Panel    Component Value Date/Time   CHOL 148 10/24/2014 1038   TRIG 73.0 10/24/2014 1038   HDL 56.00 10/24/2014 1038   CHOLHDL 3 10/24/2014 1038   VLDL 14.6 10/24/2014 1038   LDLCALC 77 10/24/2014 1038       Other studies Reviewed: Additional studies/ records that were reviewed today include: ER notes, previous OV. .   ASSESSMENT AND PLAN:  1. Chest pain- neg troponin and no recurrence, similar episode in past and was not cardiac. He is for cardiac Ct will plan  further studies if + ca. Score.    2. PAF on flecainide but states he had less PAF with higher dose of dilt.  He has been taking extra metoprolol for break through a fib.  Will increase his dilt to 120 mg BID.  3. HTN stable.  He will cal if further problems. He will keep scheduled appointments.    Current medicines are reviewed with the patient today.  The patient Has no concerns regarding medicines.  The following changes have been made:  See above Labs/ tests  ordered today include:see above  Disposition:   FU:  see above  Lennie Muckle, NP  12/06/2014 9:41 PM    [facility] [place], [place], [place], [place] Phone: [phone]; Fax: [phone]

## 2014-12-06 ENCOUNTER — Encounter: Payer: Self-pay | Admitting: Cardiology

## 2014-12-09 LAB — CUP PACEART REMOTE DEVICE CHECK: Date Time Interrogation Session: 20160711100047

## 2014-12-16 ENCOUNTER — Ambulatory Visit (HOSPITAL_COMMUNITY)
Admission: RE | Admit: 2014-12-16 | Discharge: 2014-12-16 | Disposition: A | Discharge status: Home / Self Care | Payer: Commercial Managed Care - HMO | Source: Ambulatory Visit | Attending: Internal Medicine | Admitting: Internal Medicine

## 2014-12-16 ENCOUNTER — Encounter (HOSPITAL_COMMUNITY): Payer: Self-pay

## 2014-12-16 DIAGNOSIS — R0600 Dyspnea, unspecified: Secondary | ICD-10-CM

## 2014-12-16 DIAGNOSIS — R0609 Other forms of dyspnea: Secondary | ICD-10-CM | POA: Diagnosis not present

## 2014-12-16 DIAGNOSIS — K760 Fatty (change of) liver, not elsewhere classified: Secondary | ICD-10-CM | POA: Diagnosis not present

## 2014-12-16 DIAGNOSIS — I480 Paroxysmal atrial fibrillation: Secondary | ICD-10-CM

## 2014-12-16 DIAGNOSIS — I700 Atherosclerosis of aorta: Secondary | ICD-10-CM | POA: Diagnosis not present

## 2014-12-16 DIAGNOSIS — I2510 Atherosclerotic heart disease of native coronary artery without angina pectoris: Secondary | ICD-10-CM | POA: Insufficient documentation

## 2014-12-16 DIAGNOSIS — I4891 Unspecified atrial fibrillation: Secondary | ICD-10-CM | POA: Diagnosis not present

## 2014-12-16 MED ORDER — NITROGLYCERIN 0.4 MG SL SUBL
0.4000 mg | SUBLINGUAL_TABLET | SUBLINGUAL | Status: DC | PRN
  Administered 2014-12-16: 0.8 mg via SUBLINGUAL
  Filled 2014-12-16: qty 25

## 2014-12-16 MED ORDER — METOPROLOL TARTRATE 1 MG/ML IV SOLN
5.0000 mg | Freq: Once | INTRAVENOUS | Status: AC
  Administered 2014-12-16: 5 mg via INTRAVENOUS
  Filled 2014-12-16: qty 5

## 2014-12-16 MED ORDER — NITROGLYCERIN 0.4 MG SL SUBL
SUBLINGUAL_TABLET | SUBLINGUAL | Status: AC
  Filled 2014-12-16: qty 2

## 2014-12-16 MED ORDER — IOHEXOL 350 MG/ML SOLN
80.0000 mL | Freq: Once | INTRAVENOUS | Status: AC | PRN
  Administered 2014-12-16: 80 mL via INTRAVENOUS

## 2014-12-17 ENCOUNTER — Other Ambulatory Visit (INDEPENDENT_AMBULATORY_CARE_PROVIDER_SITE_OTHER): Payer: Commercial Managed Care - HMO

## 2014-12-17 DIAGNOSIS — I480 Paroxysmal atrial fibrillation: Secondary | ICD-10-CM

## 2014-12-17 LAB — CBC WITH DIFFERENTIAL/PLATELET
Basophils Absolute: 0 10*3/uL (ref 0.0–0.1)
Basophils Relative: 0.5 % (ref 0.0–3.0)
EOS ABS: 0.1 10*3/uL (ref 0.0–0.7)
EOS PCT: 4.1 % (ref 0.0–5.0)
HCT: 39 % (ref 39.0–52.0)
Hemoglobin: 13.5 g/dL (ref 13.0–17.0)
LYMPHS ABS: 1.3 10*3/uL (ref 0.7–4.0)
LYMPHS PCT: 38.5 % (ref 12.0–46.0)
MCHC: 34.6 g/dL (ref 30.0–36.0)
MCV: 100.3 fl — ABNORMAL HIGH (ref 78.0–100.0)
MONOS PCT: 15.7 % — AB (ref 3.0–12.0)
Monocytes Absolute: 0.5 10*3/uL (ref 0.1–1.0)
NEUTROS ABS: 1.4 10*3/uL (ref 1.4–7.7)
Neutrophils Relative %: 41.2 % — ABNORMAL LOW (ref 43.0–77.0)
Platelets: 182 10*3/uL (ref 150.0–400.0)
RBC: 3.89 Mil/uL — ABNORMAL LOW (ref 4.22–5.81)
RDW: 16.4 % — ABNORMAL HIGH (ref 11.5–15.5)
WBC: 3.3 10*3/uL — AB (ref 4.0–10.5)

## 2014-12-17 LAB — BASIC METABOLIC PANEL
BUN: 8 mg/dL (ref 6–23)
CO2: 29 mEq/L (ref 19–32)
Calcium: 8.7 mg/dL (ref 8.4–10.5)
Chloride: 102 mEq/L (ref 96–112)
Creatinine, Ser: 0.72 mg/dL (ref 0.40–1.50)
GFR: 122.35 mL/min (ref >60.00)
Glucose, Bld: 72 mg/dL (ref 70–99)
Potassium: 3.8 mEq/L (ref 3.5–5.1)
SODIUM: 140 meq/L (ref 135–145)

## 2014-12-18 ENCOUNTER — Encounter: Payer: Self-pay | Admitting: Internal Medicine

## 2014-12-24 ENCOUNTER — Ambulatory Visit (HOSPITAL_COMMUNITY): Payer: Commercial Managed Care - HMO | Admitting: Certified Registered"

## 2014-12-24 ENCOUNTER — Encounter (HOSPITAL_COMMUNITY): Payer: Self-pay | Admitting: Certified Registered"

## 2014-12-24 ENCOUNTER — Encounter (HOSPITAL_COMMUNITY)
Admission: RE | Disposition: A | Discharge status: Home / Self Care | Payer: Self-pay | Source: Ambulatory Visit | Attending: Internal Medicine

## 2014-12-24 ENCOUNTER — Ambulatory Visit (HOSPITAL_COMMUNITY)
Admission: RE | Admit: 2014-12-24 | Discharge: 2014-12-25 | Disposition: A | Discharge status: Home / Self Care | Payer: Commercial Managed Care - HMO | Source: Ambulatory Visit | Attending: Internal Medicine | Admitting: Internal Medicine

## 2014-12-24 DIAGNOSIS — F419 Anxiety disorder, unspecified: Secondary | ICD-10-CM | POA: Insufficient documentation

## 2014-12-24 DIAGNOSIS — I4891 Unspecified atrial fibrillation: Secondary | ICD-10-CM | POA: Diagnosis present

## 2014-12-24 DIAGNOSIS — I10 Essential (primary) hypertension: Secondary | ICD-10-CM | POA: Insufficient documentation

## 2014-12-24 DIAGNOSIS — I483 Typical atrial flutter: Secondary | ICD-10-CM | POA: Insufficient documentation

## 2014-12-24 DIAGNOSIS — I2510 Atherosclerotic heart disease of native coronary artery without angina pectoris: Secondary | ICD-10-CM | POA: Insufficient documentation

## 2014-12-24 DIAGNOSIS — Z9889 Other specified postprocedural states: Secondary | ICD-10-CM

## 2014-12-24 DIAGNOSIS — Z8679 Personal history of other diseases of the circulatory system: Secondary | ICD-10-CM

## 2014-12-24 DIAGNOSIS — Z7901 Long term (current) use of anticoagulants: Secondary | ICD-10-CM

## 2014-12-24 DIAGNOSIS — I480 Paroxysmal atrial fibrillation: Secondary | ICD-10-CM | POA: Diagnosis not present

## 2014-12-24 DIAGNOSIS — Z87891 Personal history of nicotine dependence: Secondary | ICD-10-CM | POA: Diagnosis not present

## 2014-12-24 HISTORY — DX: Other specified postprocedural states: Z98.890

## 2014-12-24 HISTORY — DX: Personal history of other diseases of the circulatory system: Z86.79

## 2014-12-24 HISTORY — PX: ELECTROPHYSIOLOGIC STUDY: SHX172A

## 2014-12-24 LAB — POCT ACTIVATED CLOTTING TIME
ACTIVATED CLOTTING TIME: 233 s
Activated Clotting Time: 263 seconds
Activated Clotting Time: 134 seconds

## 2014-12-24 SURGERY — ATRIAL FIBRILLATION ABLATION
Anesthesia: General

## 2014-12-24 MED ORDER — ADENOSINE 6 MG/2ML IV SOLN
INTRAVENOUS | Status: AC
  Filled 2014-12-24: qty 16

## 2014-12-24 MED ORDER — SODIUM CHLORIDE 0.9 % IV SOLN
INTRAVENOUS | Status: DC
  Administered 2014-12-24: 09:00:00 via INTRAVENOUS

## 2014-12-24 MED ORDER — ACETAMINOPHEN 325 MG PO TABS: 650.0000 mg | ORAL_TABLET | ORAL | Status: DC | PRN

## 2014-12-24 MED ORDER — PHENYLEPHRINE HCL 10 MG/ML IJ SOLN
10.0000 mg | INTRAVENOUS | Status: DC | PRN
  Administered 2014-12-24: 10 ug/min via INTRAVENOUS

## 2014-12-24 MED ORDER — ONDANSETRON HCL 4 MG/2ML IJ SOLN: 4.0000 mg | Freq: Once | INTRAMUSCULAR | Status: DC | PRN

## 2014-12-24 MED ORDER — CITALOPRAM HYDROBROMIDE 20 MG PO TABS
20.0000 mg | ORAL_TABLET | Freq: Every day | ORAL | Status: DC
  Filled 2014-12-24: qty 1

## 2014-12-24 MED ORDER — PROPOFOL 10 MG/ML IV BOLUS
INTRAVENOUS | Status: DC | PRN
  Administered 2014-12-24: 20 mg via INTRAVENOUS
  Administered 2014-12-24: 200 mg via INTRAVENOUS

## 2014-12-24 MED ORDER — DOBUTAMINE IN D5W 4-5 MG/ML-% IV SOLN
INTRAVENOUS | Status: DC | PRN
  Administered 2014-12-24: 20 ug/kg/min via INTRAVENOUS

## 2014-12-24 MED ORDER — BUPIVACAINE HCL (PF) 0.25 % IJ SOLN
INTRAMUSCULAR | Status: DC | PRN
  Administered 2014-12-24: 12 mL

## 2014-12-24 MED ORDER — MEPERIDINE HCL 25 MG/ML IJ SOLN: 6.2500 mg | INTRAMUSCULAR | Status: DC | PRN

## 2014-12-24 MED ORDER — RIVAROXABAN 20 MG PO TABS
20.0000 mg | ORAL_TABLET | Freq: Every day | ORAL | Status: DC
  Administered 2014-12-24: 20 mg via ORAL
  Filled 2014-12-24 (×2): qty 1

## 2014-12-24 MED ORDER — SODIUM CHLORIDE 0.9 % IJ SOLN: 3.0000 mL | INTRAMUSCULAR | Status: DC | PRN

## 2014-12-24 MED ORDER — SODIUM CHLORIDE 0.9 % IJ SOLN
3.0000 mL | Freq: Two times a day (BID) | INTRAMUSCULAR | Status: DC
  Administered 2014-12-24: 3 mL via INTRAVENOUS

## 2014-12-24 MED ORDER — HEPARIN SODIUM (PORCINE) 1000 UNIT/ML IJ SOLN
INTRAMUSCULAR | Status: AC
  Filled 2014-12-24: qty 1

## 2014-12-24 MED ORDER — PROTAMINE SULFATE 10 MG/ML IV SOLN
INTRAVENOUS | Status: DC | PRN
  Administered 2014-12-24 (×3): 10 mg via INTRAVENOUS

## 2014-12-24 MED ORDER — ONDANSETRON HCL 4 MG/2ML IJ SOLN: 4.0000 mg | Freq: Four times a day (QID) | INTRAMUSCULAR | Status: DC | PRN

## 2014-12-24 MED ORDER — HEPARIN SODIUM (PORCINE) 1000 UNIT/ML IJ SOLN
INTRAMUSCULAR | Status: DC | PRN
  Administered 2014-12-24 (×2): 4000 [IU] via INTRAVENOUS

## 2014-12-24 MED ORDER — LORAZEPAM 0.5 MG PO TABS: 0.5000 mg | ORAL_TABLET | Freq: Every day | ORAL | Status: DC | PRN

## 2014-12-24 MED ORDER — HYDROCODONE-ACETAMINOPHEN 5-325 MG PO TABS: 1.0000 | ORAL_TABLET | ORAL | Status: DC | PRN

## 2014-12-24 MED ORDER — LIDOCAINE HCL (CARDIAC) 20 MG/ML IV SOLN
INTRAVENOUS | Status: DC | PRN
  Administered 2014-12-24: 50 mg via INTRAVENOUS

## 2014-12-24 MED ORDER — FENTANYL CITRATE (PF) 100 MCG/2ML IJ SOLN
INTRAMUSCULAR | Status: DC | PRN
  Administered 2014-12-24: 100 ug via INTRAVENOUS
  Administered 2014-12-24 (×2): 25 ug via INTRAVENOUS

## 2014-12-24 MED ORDER — ADENOSINE 6 MG/2ML IV SOLN
INTRAVENOUS | Status: DC | PRN
  Administered 2014-12-24 (×2): 12 mg via INTRAVENOUS

## 2014-12-24 MED ORDER — IOHEXOL 350 MG/ML SOLN
INTRAVENOUS | Status: DC | PRN
  Administered 2014-12-24: 8 mg via INTRACARDIAC

## 2014-12-24 MED ORDER — ONDANSETRON HCL 4 MG/2ML IJ SOLN
INTRAMUSCULAR | Status: DC | PRN
  Administered 2014-12-24: 4 mg via INTRAVENOUS

## 2014-12-24 MED ORDER — BUPIVACAINE HCL (PF) 0.25 % IJ SOLN
INTRAMUSCULAR | Status: AC
  Filled 2014-12-24: qty 30

## 2014-12-24 MED ORDER — HEPARIN SODIUM (PORCINE) 1000 UNIT/ML IJ SOLN
INTRAMUSCULAR | Status: DC | PRN
  Administered 2014-12-24: 12000 [IU] via INTRAVENOUS

## 2014-12-24 MED ORDER — DOBUTAMINE IN D5W 4-5 MG/ML-% IV SOLN
INTRAVENOUS | Status: AC
  Filled 2014-12-24: qty 250

## 2014-12-24 MED ORDER — MIDAZOLAM HCL 5 MG/5ML IJ SOLN
INTRAMUSCULAR | Status: DC | PRN
  Administered 2014-12-24: 2 mg via INTRAVENOUS

## 2014-12-24 MED ORDER — SODIUM CHLORIDE 0.9 % IV SOLN: 250.0000 mL | INTRAVENOUS | Status: DC | PRN

## 2014-12-24 MED ORDER — HYDROMORPHONE HCL 1 MG/ML IJ SOLN: 0.2500 mg | INTRAMUSCULAR | Status: DC | PRN

## 2014-12-24 SURGICAL SUPPLY — 25 items
BAG SNAP BAND KOVER 36X36 (MISCELLANEOUS) ×2 IMPLANT
BLANKET WARM UNDERBOD FULL ACC (MISCELLANEOUS) ×2 IMPLANT
CATH DIAG 6FR PIGTAIL (CATHETERS) ×1 IMPLANT
CATH FIXED LASSO 20POLE D 20MM (CATHETERS) ×1 IMPLANT
CATH NAVISTAR SMARTTOUCH DF (ABLATOR) ×2 IMPLANT
CATH SOUNDSTAR 3D IMAGING (CATHETERS) ×2 IMPLANT
CATH WEBSTER BI DIR CS D-F CRV (CATHETERS) ×2 IMPLANT
COVER SWIFTLINK CONNECTOR (BAG) ×2 IMPLANT
NDL TRANSEP BRK 71CM 407200 (NEEDLE) ×1 IMPLANT
NEEDLE TRANSEP BRK 71CM 407200 (NEEDLE) ×2 IMPLANT
PACK EP LATEX FREE (CUSTOM PROCEDURE TRAY) ×2
PACK EP LF (CUSTOM PROCEDURE TRAY) ×1 IMPLANT
PAD DEFIB LIFELINK (PAD) ×2 IMPLANT
PATCH CARTO3 (PAD) ×2 IMPLANT
SHEATH AVANTI 11F 11CM (SHEATH) ×2 IMPLANT
SHEATH PINNACLE 6F 10CM (SHEATH) ×1 IMPLANT
SHEATH PINNACLE 7F 10CM (SHEATH) ×3 IMPLANT
SHEATH PINNACLE 8F 10CM (SHEATH) ×1 IMPLANT
SHEATH PINNACLE 9F 10CM (SHEATH) ×2 IMPLANT
SHEATH SWARTZ TS SL2 63CM 8.5F (SHEATH) ×2 IMPLANT
SHIELD RADPAD SCOOP 12X17 (MISCELLANEOUS) ×2 IMPLANT
SYR MEDRAD MARK V 150ML (SYRINGE) ×1 IMPLANT
TUBING CONTRAST HIGH PRESS 48 (TUBING) ×1 IMPLANT
TUBING SMART ABLATE COOLFLOW (TUBING) ×2 IMPLANT
WIRE AMPLATZ ST .035X145CM (WIRE) ×1 IMPLANT

## 2014-12-24 NOTE — Progress Notes (Signed)
Site area: rt groin 3 fv sheaths Site Prior to Removal:  Level  0 Pressure Applied For:  20 minutes Manual:   Yes  Patient Status During Pull:  stable Post Pull Site:  Level  0 Post Pull Instructions Given:  yes Post Pull Pulses Present: yes Dressing Applied:  Small tegaderm Bedrest begins @  1640 Comments:  0

## 2014-12-24 NOTE — Anesthesia Postprocedure Evaluation (Signed)
Anesthesia Post Note  Patient: Evan [name]  Procedure(s) Performed: Procedure(s) (LRB): Atrial Fibrillation Ablation ([name])  Anesthesia type: general  Patient location: PACU  Post pain: Pain level controlled  Post assessment: Patient's Cardiovascular Status Stable  Last Vitals:  Filed Vitals:   12/24/14 1625  BP: 127/84  Pulse: 80  Temp:   Resp: 18    Post vital signs: Reviewed and stable  Level of consciousness: sedated  Complications: No apparent anesthesia complications

## 2014-12-24 NOTE — Anesthesia Procedure Notes (Signed)
Procedure Name: LMA Insertion Date/Time: 12/24/2014 12:24 PM Performed by: Lavell Luster Pre-anesthesia Checklist: Patient identified, Emergency Drugs available, Suction available, Patient being monitored and Timeout performed Patient Re-evaluated:Patient Re-evaluated prior to inductionOxygen Delivery Method: Circle system utilized Preoxygenation: Pre-oxygenation with 100% oxygen Intubation Type: IV induction Ventilation: Mask ventilation without difficulty LMA: LMA inserted LMA Size: 5.0 Tube type: Oral Number of attempts: 1 Placement Confirmation: positive ETCO2 and breath sounds checked- equal and bilateral Tube secured with: Tape Dental Injury: Teeth and Oropharynx as per pre-operative assessment

## 2014-12-24 NOTE — Anesthesia Preprocedure Evaluation (Addendum)
Anesthesia Evaluation  Patient identified by MRN, date of birth, ID band Patient awake    Reviewed: Allergy & Precautions, NPO status , Patient's Chart, lab work & pertinent test results  History of Anesthesia Complications Negative for: history of anesthetic complications  Airway Mallampati: I  TM Distance: >3 FB Neck ROM: Full    Dental  (+) Caps, Dental Advisory Given, Teeth Intact,    Pulmonary shortness of breath and with exertion, former smoker,    Pulmonary exam normal       Cardiovascular hypertension, Pt. on medications + DOE Normal cardiovascular exam+ dysrhythmias Atrial Fibrillation     Neuro/Psych Anxiety    GI/Hepatic negative GI ROS, Neg liver ROS,   Endo/Other  negative endocrine ROS  Renal/GU negative Renal ROS     Musculoskeletal negative musculoskeletal ROS (+)   Abdominal   Peds  Hematology negative hematology ROS (+)   Anesthesia Other Findings Cap on front tooth and crowns on the back  Reproductive/Obstetrics negative OB ROS                         Anesthesia Physical Anesthesia Plan  ASA: III  Anesthesia Plan: General   Post-op Pain Management:    Induction: Intravenous  Airway Management Planned: LMA  Additional Equipment:   Intra-op Plan:   Post-operative Plan: Extubation in OR  Informed Consent: I have reviewed the patients History and Physical, chart, labs and discussed the procedure including the risks, benefits and alternatives for the proposed anesthesia with the patient or authorized representative who has indicated his/her understanding and acceptance.     Plan Discussed with: CRNA and Surgeon  Anesthesia Plan Comments:         Anesthesia Quick Evaluation

## 2014-12-24 NOTE — Transfer of Care (Signed)
Immediate Anesthesia Transfer of Care Note  Patient: Evan [name]  Procedure(s) Performed: Procedure(s): Atrial Fibrillation Ablation ([name])  Patient Location: Cath Lab  Anesthesia Type:General  Level of Consciousness: awake and alert   Airway & Oxygen Therapy: Patient Spontanous Breathing and Patient connected to nasal cannula oxygen  Post-op Assessment: Report given to RN and Post -op Vital signs reviewed and stable  Post vital signs: Reviewed and stable  Last Vitals:  Filed Vitals:   12/24/14 0820  BP: 127/82  Pulse: 79  Temp: 36.8 C  Resp: 16    Complications: No apparent anesthesia complications

## 2014-12-24 NOTE — H&P (Signed)
PCP: Gara Kroner, MD  Cardiologist: Dr Meda Coffee  Primary Electrophysiologist: Thompson Grayer, MD   Chief Complaint   Patient presents with   .  PAF    History of Present Illness:  Evan [name] is a 49 y.o. male who presents today for afib ablation. He continues to have episodes of afib, mostly at night. ILR Interrogation today reveals af burden to be 3.8%. V rates are mostly controlled. He reports that episodes are alarming and wake him from sleep. He does not know if he snores but does not feel well resting upon waking.  Today, he denies symptoms of orthopnea, PND, lower extremity edema, claudication, dizziness, presyncope, syncope, bleeding, or neurologic sequela. The patient is tolerating medications without difficulties and is otherwise without complaint today.   Past Medical History   Diagnosis  Date   .  Hypertension    .  Anxiety    .  Psoriasis    .  Atrial fibrillation      a. PVI 04/2014 Dr Rayann Heman b. failed medical therapy with Multaq   .  Atrial flutter      a. CTI ablation 04/2014 Dr Rayann Heman    Past Surgical History   Procedure  Laterality  Date   .  Hernia repair  Right      with mesh   .  Carpal tunnel release     .  Tee without cardioversion  [name]  05/06/2014     Procedure: TRANSESOPHAGEAL ECHOCARDIOGRAM (TEE); Surgeon: Pixie Casino, MD; Location: [facility] ENDOSCOPY; Service: Cardiovascular; Laterality: [name];   .  Atrial fibrillation ablation  [name]  05/07/2014     PVI and CTI by Dr Rayann Heman   .  Loop recorder implant  [name]  08/07/2014     Procedure: LOOP RECORDER IMPLANT; Surgeon: Thompson Grayer, MD; Location: [facility] CATH LAB; Service: Cardiovascular; Laterality: [name];    Current Outpatient Prescriptions   Medication  Sig  Dispense  Refill   .  citalopram (CELEXA) 20 MG tablet  Take 1 tablet by mouth daily.   3   .  clobetasol ointment (TEMOVATE) 1.30 %  Apply 1 application topically 2 (two) times daily as needed (itching).     Marland Kitchen  diltiazem (DILACOR XR) 120 MG 24 hr capsule  Take  120 mg by mouth daily.     .  fluocinonide cream (LIDEX) 8.65 %  Use 1 application to affected areas twice daily for 2 to 4 weeks   0   .  lisinopril (PRINIVIL,ZESTRIL) 5 MG tablet  Take 1 tablet (5 mg total) by mouth daily.  90 tablet  3   .  metoprolol tartrate (LOPRESSOR) 25 MG tablet  Take 25-50 mg by mouth daily as needed (onset of adfib). Take 1- 2 tablets by mouth if pt has onset of afib or if heartrate goes pass 100     .  Multiple Vitamin (MULTIVITAMIN WITH MINERALS) TABS  Take 1 tablet by mouth daily.     .  potassium gluconate 595 MG TABS tablet  Take 595 mg by mouth daily.     Alveda Reasons 20 MG TABS tablet  TAKE ONE TABLET BY MOUTH EVERY DAY with SUPPER  30 tablet  0   .  zinc gluconate 50 MG tablet  Take 50 mg by mouth daily.      No current facility-administered medications for this visit.    Allergies: Adhesive and Eliquis  Social History: The patient reports that he has quit smoking. His smoking  use included Cigarettes and Cigars. He does not have any smokeless tobacco history on file. He reports that he drinks alcohol. He reports that he does not use illicit drugs.  Family History: The patient's family history includes Diabetes in his mother; Heart attack in his brother; Heart failure in his mother; Hypertension in his mother.  ROS: Please see the history of present illness. All other systems are reviewed and negative.   PHYSICAL EXAM:  Filed Vitals:   12/24/14 0820  BP: 127/82  Pulse: 79  Temp: 98.3 F (36.8 C)  Resp: 16   .  GEN: Well nourished, well developed, in no acute distress  HEENT: normal  Neck: no JVD, carotid bruits, or masses  Cardiac: RRR; no murmurs, rubs, or gallops,no edema  Respiratory: clear to auscultation bilaterally, normal work of breathing  GI: soft, nontender, nondistended, + BS  MS: no deformity or atrophy  Skin: warm and dry  Neuro: Strength and sensation are intact  Psych: euthymic mood, full affect   Cardiac CT is reviewed ekg today  reveals sinus rhythm   ASSESSMENT AND PLAN:  1. Paroxysmal atrial fibrillation  Continues to have intermittent afib.  Therapeutic strategies for afib including medicine and repeat ablation were discussed in detail with the patient today. Risk, benefits, and alternatives to EP study and radiofrequency ablation were also discussed in detail today. These risks include but are not limited to stroke, bleeding, vascular damage, tamponade, perforation, damage to the esophagus, lungs, and other structures, pulmonary vein stenosis, worsening renal function, and death. The patient understands these risk and wishes to proceed. He reports compliance with xarelto without interuption.  Cardiac CT reveals no LAA thrombus.  Sinus rhythm today.

## 2014-12-24 NOTE — Progress Notes (Signed)
Dr. Rayann Heman by to see patient. Family in.

## 2014-12-25 ENCOUNTER — Encounter (HOSPITAL_COMMUNITY): Payer: Self-pay | Admitting: Internal Medicine

## 2014-12-25 DIAGNOSIS — I480 Paroxysmal atrial fibrillation: Secondary | ICD-10-CM | POA: Diagnosis not present

## 2014-12-25 DIAGNOSIS — Z87891 Personal history of nicotine dependence: Secondary | ICD-10-CM | POA: Diagnosis not present

## 2014-12-25 DIAGNOSIS — Z8679 Personal history of other diseases of the circulatory system: Secondary | ICD-10-CM

## 2014-12-25 DIAGNOSIS — Z9889 Other specified postprocedural states: Secondary | ICD-10-CM

## 2014-12-25 DIAGNOSIS — I10 Essential (primary) hypertension: Secondary | ICD-10-CM | POA: Diagnosis not present

## 2014-12-25 DIAGNOSIS — I483 Typical atrial flutter: Secondary | ICD-10-CM | POA: Diagnosis not present

## 2014-12-25 HISTORY — DX: Other specified postprocedural states: Z98.890

## 2014-12-25 HISTORY — DX: Personal history of other diseases of the circulatory system: Z86.79

## 2014-12-25 LAB — MRSA PCR SCREENING: MRSA BY PCR: NEGATIVE

## 2014-12-25 MED ORDER — PANTOPRAZOLE SODIUM 40 MG PO TBEC: 40.0000 mg | DELAYED_RELEASE_TABLET | Freq: Every day | ORAL | Status: DC

## 2014-12-25 MED ORDER — ACETAMINOPHEN 325 MG PO TABS: 650.0000 mg | ORAL_TABLET | ORAL | Status: DC | PRN

## 2014-12-25 NOTE — Discharge Instructions (Signed)
Call [facility] at [phone] if any bleeding, swelling or drainage at cath site.  May shower, no tub baths for 48 hours. No lifting over 5 pounds for 5 days.  No Driving for 3 days.  No strenuous activity, no sexual activity for 1 week.  Do not stop Xarelto.  We added Protonix for 45 days.

## 2014-12-25 NOTE — Discharge Summary (Signed)
Physician Discharge Summary       Patient ID: Evan [name] MRN: [id number] DOB/AGE: [birth year] 49 y.o.  Admit date: 12/24/2014 Discharge date: 12/25/2014   Primary Cardiologist:Dr. Meda Coffee EP: Dr. Rayann Heman   Discharge Diagnoses:  Principal Problem:   Atrial fibrillation Active Problems:   Anticoagulation adequate   A-fib   S/P ablation of atrial fibrillation 12/24/14   Discharged Condition: good  Procedures: 12/24/14 a fib ablation- Dr. Kearney Hard Course: 49 year old male admitted due history of episodes of afib, mostly at night. ILR Interrogation prior to admit revealed af burden to be 3.8%. V rates are mostly controlled. He reported that episodes are alarming and wake him from sleep. He does not know if he snores but does not feel well resting upon waking.  Dr. Rayann Heman saw and evaluated and decision for a fib ablation was planned.  Pt admitted 12/24/14 and underwent ablation without complications.  By AM of discharge was stable and seen by Dr. Rayann Heman and discharged.  He remains on Xarelto with (Chads2vac score is 1) and we added protonix for 45 days.   Prior to admit pt had Cardiac CT: IMPRESSION: 1. Coronary calcium score of 0. This was 0 percentile for age and sex matched control.  2. Normal coronary origin. Right dominance. Minimal non-obstructive CAD in the proximal RCA.  3. There is normal pulmonary vein drainage into the left atrium. There are two right and two left pulmonary veins with no evidence of stenosis.  Esophagus runs posteriorly and adjacent to the ostia of the LUPV and LLPV.  4. A large left atrial appendage has no filling defect.  5. Normal biatrial size.   Consults: None  Significant Diagnostic Studies:  BMP Latest Ref Rng 12/17/2014 11/28/2014 10/24/2014  Glucose 70 - 99 mg/dL 72 105(H) 93  BUN 6 - 23 mg/dL 8 <5(L) 17  Creatinine 0.40 - 1.50 mg/dL 0.72 0.94 0.98  Sodium 135 - 145 mEq/L 140 138 137  Potassium 3.5 - 5.1 mEq/L 3.8 3.7  4.0  Chloride 96 - 112 mEq/L 102 97(L) 102  CO2 19 - 32 mEq/L 29 28 29   Calcium 8.4 - 10.5 mg/dL 8.7 8.6(L) 9.4   CBC Latest Ref Rng 12/17/2014 11/28/2014 10/24/2014  WBC 4.0 - 10.5 K/uL 3.3(L) 5.1 4.8  Hemoglobin 13.0 - 17.0 g/dL 13.5 14.4 14.6  Hematocrit 39.0 - 52.0 % 39.0 40.3 42.8  Platelets 150.0 - 400.0 K/uL 182.0 210 304.0    EKG at discharge: NSR  Discharge Exam: Blood pressure 131/85, pulse 89, temperature 98.4 F (36.9 C), temperature source Oral, resp. rate 16, height 6\' 2"  (1.88 m), weight 196 lb 8 oz (89.132 kg), SpO2 97 %.  per Dr. Rayann Heman General:Pleasant affect, NAD Skin:Warm and dry, brisk capillary refill HEENT:normocephalic, sclera clear, mucus membranes moist Heart:S1S2 RRR without murmur, gallup, rub or click Lungs:clear without rales, rhonchi, or wheezes BPZ:WCHE, non tender, + BS, do not palpate liver spleen or masses Ext:no lower ext edema, 2+ pedal pulses, 2+ radial pulses Neuro:alert and oriented X 3, MAE, follows commands, + facial symmetry  Disposition: 01-Home or Self Care     Medication List    TAKE these medications        acetaminophen 325 MG tablet  Commonly known as:  TYLENOL  Take 2 tablets (650 mg total) by mouth every 4 (four) hours as needed for headache or mild pain.     citalopram 20 MG tablet  Commonly known as:  CELEXA  Take 20 mg by  mouth daily with lunch.     clobetasol ointment 0.05 %  Commonly known as:  TEMOVATE  Apply 1 application topically daily as needed (psoriasis).     diltiazem 120 MG 12 hr capsule  Commonly known as:  CARDIZEM SR  Take 1 capsule (120 mg total) by mouth 2 (two) times daily.     EYELID CLEANSERS EX  Apply 1 application topically 3 (three) times a week.     flecainide 50 MG tablet  Commonly known as:  TAMBOCOR  Take 1 tablet (50 mg total) by mouth 2 (two) times daily.     fluocinonide cream 0.05 %  Commonly known as:  LIDEX  Apply 1 application topically daily as needed (psoriasis).      lisinopril 5 MG tablet  Commonly known as:  PRINIVIL,ZESTRIL  Take 1 tablet (5 mg total) by mouth daily.     LORazepam 0.5 MG tablet  Commonly known as:  ATIVAN  Take 0.5 mg by mouth daily as needed for anxiety.     metoprolol tartrate 25 MG tablet  Commonly known as:  LOPRESSOR  Take 25 mg by mouth every 2 (two) hours as needed (for heartrate stays over 120 for more than 30 minutes or for onset of afib).     multivitamin with minerals Tabs tablet  Take 1 tablet by mouth daily with supper.     pantoprazole 40 MG tablet  Commonly known as:  PROTONIX  Take 1 tablet (40 mg total) by mouth daily.     Polyethyl Glycol-Propyl Glycol 0.4-0.3 % Soln  Place 1 drop into both eyes daily as needed (dry eyes).     potassium gluconate 595 MG Tabs tablet  Take 595 mg by mouth daily with lunch.     XARELTO 20 MG Tabs tablet  Generic drug:  rivaroxaban  TAKE ONE TABLET BY MOUTH EVERY DAY with SUPPER     zinc gluconate 50 MG tablet  Take 50 mg by mouth daily with supper.       Follow-up Information    Follow up with CARROLL,DONNA, NP On 01/20/2015.   Specialties:  Nurse Practitioner, Cardiology   Why:  at 11:00am   Contact information:   [place] [id number] [phone]       Follow up with Thompson Grayer, MD On 03/31/2015.   Specialty:  Cardiology   Why:  at 11:OOam   Contact information:   [place] [id number] [phone]        Discharge Instructions: Call [facility] at [phone] if any bleeding, swelling or drainage at cath site.  May shower, no tub baths for 48 hours. No lifting over 5 pounds for 5 days.  No Driving for 3 days.  No strenuous activity, no sexual activity for 1 week.  Do not stop Xarelto.  We added Protonix for 45 days.    Signed: Isaiah Serge Nurse Practitioner-Certified  Medical Group: [facility] 12/25/2014, 8:28 AM  Time spent on discharge : >35 minutes.    I have  seen, examined the patient, and reviewed the above assessment and plan.  Changes to above are made where necessary.    Co Sign: Thompson Grayer, MD 12/25/2014 5:19 PM

## 2015-01-03 ENCOUNTER — Ambulatory Visit (INDEPENDENT_AMBULATORY_CARE_PROVIDER_SITE_OTHER): Payer: Commercial Managed Care - HMO | Admitting: *Deleted

## 2015-01-03 DIAGNOSIS — I480 Paroxysmal atrial fibrillation: Secondary | ICD-10-CM

## 2015-01-07 NOTE — Progress Notes (Signed)
Loop recorder 

## 2015-01-13 LAB — CUP PACEART REMOTE DEVICE CHECK: Date Time Interrogation Session: 20160815144117

## 2015-01-20 ENCOUNTER — Inpatient Hospital Stay (HOSPITAL_COMMUNITY): Admission: RE | Admit: 2015-01-20 | Payer: Self-pay | Source: Ambulatory Visit | Admitting: Nurse Practitioner

## 2015-01-21 ENCOUNTER — Ambulatory Visit (HOSPITAL_BASED_OUTPATIENT_CLINIC_OR_DEPARTMENT_OTHER): Payer: Commercial Managed Care - HMO | Attending: Internal Medicine | Admitting: Radiology

## 2015-01-21 VITALS — Ht 74.0 in | Wt 195.0 lb

## 2015-01-21 DIAGNOSIS — I10 Essential (primary) hypertension: Secondary | ICD-10-CM | POA: Diagnosis not present

## 2015-01-21 DIAGNOSIS — I480 Paroxysmal atrial fibrillation: Secondary | ICD-10-CM | POA: Diagnosis not present

## 2015-01-21 DIAGNOSIS — R5383 Other fatigue: Secondary | ICD-10-CM | POA: Diagnosis present

## 2015-01-21 DIAGNOSIS — G4719 Other hypersomnia: Secondary | ICD-10-CM | POA: Diagnosis not present

## 2015-01-23 ENCOUNTER — Encounter: Payer: Self-pay | Admitting: Internal Medicine

## 2015-01-23 ENCOUNTER — Telehealth: Payer: Self-pay | Admitting: Cardiology

## 2015-01-23 DIAGNOSIS — G4733 Obstructive sleep apnea (adult) (pediatric): Secondary | ICD-10-CM

## 2015-01-23 DIAGNOSIS — G4719 Other hypersomnia: Secondary | ICD-10-CM | POA: Insufficient documentation

## 2015-01-23 NOTE — Telephone Encounter (Signed)
Please let patient know that he did not sleep enough during sleep study to assess for sleep apnea.  Please set up repeat sleep study with Lunesta 2mg  (#1 tablet with no refills) to take on arrival in sleep lab.

## 2015-01-23 NOTE — Sleep Study (Signed)
SLEEP STUDY   Patient Name: Evan [name], Evan [name] Date: 01/21/2015 Gender: Male D.O.B: [birth year] Age (years): 45 Referring Provider: Thompson Grayer Height (inches): 37 Interpreting Physician: Fransico Him MD, ABSM Weight (lbs): 195 RPSGT: Laren Everts BMI: 25 MRN: [id number] Neck Size: 15.00 CLINICAL INFORMATION Sleep Study Type: NPSG  Indication for sleep study: Fatigue, Hypertension  Epworth Sleepiness Score: 9  SLEEP STUDY TECHNIQUE As per the AASM Manual for the Scoring of Sleep and Associated Events v2.3 (April 2016) with a hypopnea requiring 4% desaturations.  The channels recorded and monitored were frontal, central and occipital EEG, electrooculogram (EOG), submentalis EMG (chin), nasal and oral airflow, thoracic and abdominal wall motion, anterior tibialis EMG, snore microphone, electrocardiogram, and pulse oximetry.  MEDICATIONS Patient's medications include: Celexa, Cardizem, Flecainide, Lisinopril, Ativan, Metoprolol, Protonix, Xarelto. Medications self-administered by patient during sleep study : No sleep medicine administered.  SLEEP ARCHITECTURE The study was initiated at 11:09:44 PM and ended at 5:04:15 AM.  Sleep onset time was delayed at 51.5 minutes and the sleep efficiency was severely reduced at 1.6%. The total sleep time was 5.5 minutes.  Stage REM latency was [name] minutes.  The patient spent 100.00% of the night in stage N1 sleep, 0.00% in stage N2 sleep, 0.00% in stage N3 and 0.00% in REM.  Alpha intrusion was absent.  Supine sleep was 100.00%.  RESPIRATORY PARAMETERS The overall apnea/hypopnea index (AHI) was 0.0 per hour. There were 0 total apneas, including 0 obstructive, 0 central and 0 mixed apneas. There were 0 hypopneas and 5 RERAs.  The AHI during Stage REM sleep was [name] per hour.  AHI while supine was 0.0 per hour.  The mean oxygen saturation was 96.20%. The minimum SpO2 during sleep was 95.00%.  snoring was noted during this  study.  CARDIAC DATA The 2 lead EKG demonstrated sinus rhythm. The mean heart rate was 75.90 beats per minute. Other EKG findings include: None.  LEG MOVEMENT DATA The total PLMS were 0 with a resulting PLMS index of 0.00. Associated arousal with leg movement index was 0.0 .  IMPRESSIONS No significant obstructive sleep apnea occurred during this study (AHI = 0.0/h) but insufficient sleep time to accurately assess.  The patient only slept 5 minutes. No significant central sleep apnea occurred during this study (CAI = 0.0/h). The patient had minimal or no oxygen desaturation during the study (Min O2 = 95.00%) No snoring was audible during this study. No cardiac abnormalities were noted during this study. Clinically significant periodic limb movements did not occur during sleep. No significant associated arousals.  DIAGNOSIS Escessive Daytime sleepiness  RECOMMENDATIONS As patient was unable to sleep during the study, recommend repeat split night sleep study with a sleep aide. Avoid alcohol, sedatives and other CNS depressants that may worsen sleep apnea and disrupt normal sleep architecture. Sleep hygiene should be reviewed to assess factors that may improve sleep quality. Weight management and regular exercise should be initiated or continued if appropriate.  Signed: Fransico Him, MD ABSM Diplomate, American Board of Sleep Medicine 01/23/2015

## 2015-01-24 ENCOUNTER — Encounter: Payer: Self-pay | Admitting: *Deleted

## 2015-01-24 NOTE — Addendum Note (Signed)
Addended by: Andres Ege on: 01/24/2015 09:18 AM   Modules accepted: Orders

## 2015-01-24 NOTE — Telephone Encounter (Signed)
Informed patient of results.  Sleep Aide called in to [facility]. Repeat study scheduled for 02/14/15. Letter being sent to patient to remind him.

## 2015-01-29 ENCOUNTER — Encounter (HOSPITAL_COMMUNITY): Payer: Self-pay | Admitting: Nurse Practitioner

## 2015-01-29 ENCOUNTER — Ambulatory Visit (HOSPITAL_COMMUNITY)
Admission: RE | Admit: 2015-01-29 | Discharge: 2015-01-29 | Disposition: A | Discharge status: Home / Self Care | Payer: Commercial Managed Care - HMO | Source: Ambulatory Visit | Attending: Nurse Practitioner | Admitting: Nurse Practitioner

## 2015-01-29 VITALS — BP 150/90 | HR 82 | Ht 74.0 in | Wt 193.4 lb

## 2015-01-29 DIAGNOSIS — I481 Persistent atrial fibrillation: Secondary | ICD-10-CM | POA: Diagnosis not present

## 2015-01-29 DIAGNOSIS — I10 Essential (primary) hypertension: Secondary | ICD-10-CM | POA: Insufficient documentation

## 2015-01-29 DIAGNOSIS — I4819 Other persistent atrial fibrillation: Secondary | ICD-10-CM

## 2015-01-29 DIAGNOSIS — Z87891 Personal history of nicotine dependence: Secondary | ICD-10-CM | POA: Diagnosis not present

## 2015-01-29 NOTE — Progress Notes (Signed)
Patient ID: Evan [name], male   DOB: Jul 27, 1965, 49 y.o.   MRN: [id number]     Primary Care Physician: Gara Kroner, MD Referring Physician: Dr. Derenda Fennel is a 49 y.o. male with a h/o persistent afib, last one one month ago for f/u in the afib clinic. He reports that he is ding well since the second ablation without  Any afib. No issues with swallowing or rt groin pain since procedure. He continues on flecainide 50 mg bid. No missed doses of xarelto.  Today, he denies symptoms of palpitations, chest pain, shortness of breath, orthopnea, PND, lower extremity edema, dizziness, presyncope, syncope, or neurologic sequela. The patient is tolerating medications without difficulties and is otherwise without complaint today.   Past Medical History  Diagnosis Date  . Hypertension   . Anxiety   . Psoriasis   . Atrial fibrillation     a. PVI 04/2014 Dr Rayann Heman b. failed medical therapy with Multaq  . Atrial flutter     a. CTI ablation 04/2014 Dr Rayann Heman  . S/P ablation of atrial fibrillation 12/24/14 12/25/2014   Past Surgical History  Procedure Laterality Date  . Hernia repair Right     with mesh  . Carpal tunnel release    . Tee without cardioversion [name] 05/06/2014    Procedure: TRANSESOPHAGEAL ECHOCARDIOGRAM (TEE);  Surgeon: Pixie Casino, MD;  Location: [facility] ENDOSCOPY;  Service: Cardiovascular;  Laterality: [name];  . Atrial fibrillation ablation [name] 05/07/2014    PVI and CTI by Dr Rayann Heman  . Loop recorder implant [name] 08/07/2014    Procedure: LOOP RECORDER IMPLANT;  Surgeon: Thompson Grayer, MD;  Location: [facility] CATH LAB;  Service: Cardiovascular;  Laterality: [name];  . Electrophysiologic study [name] 12/24/2014    Procedure: Atrial Fibrillation Ablation;  Surgeon: Thompson Grayer, MD;  Location: [place] CV LAB;  Service: Cardiovascular;  Laterality: [name];    Current Outpatient Prescriptions  Medication Sig Dispense Refill  . citalopram (CELEXA) 20 MG tablet Take 20 mg by mouth daily with  lunch.   3  . clobetasol ointment (TEMOVATE) 7.61 % Apply 1 application topically daily as needed (psoriasis).     Marland Kitchen diltiazem (CARDIZEM SR) 120 MG 12 hr capsule Take 1 capsule (120 mg total) by mouth 2 (two) times daily. 180 capsule 3  . EYELID CLEANSERS EX Apply 1 application topically 3 (three) times a week.     . flecainide (TAMBOCOR) 50 MG tablet Take 1 tablet (50 mg total) by mouth 2 (two) times daily. 180 tablet 3  . fluocinonide cream (LIDEX) 6.07 % Apply 1 application topically daily as needed (psoriasis).   0  . lisinopril (PRINIVIL,ZESTRIL) 5 MG tablet Take 1 tablet (5 mg total) by mouth daily. (Patient taking differently: Take 5 mg by mouth daily with lunch. ) 90 tablet 3  . LORazepam (ATIVAN) 0.5 MG tablet Take 0.5 mg by mouth daily as needed for anxiety.    . Multiple Vitamin (MULTIVITAMIN WITH MINERALS) TABS Take 1 tablet by mouth daily with supper.     . pantoprazole (PROTONIX) 40 MG tablet Take 1 tablet (40 mg total) by mouth daily. 45 tablet 0  . Polyethyl Glycol-Propyl Glycol 0.4-0.3 % SOLN Place 1 drop into both eyes daily as needed (dry eyes).    . potassium gluconate 595 MG TABS tablet Take 595 mg by mouth daily with lunch.     Alveda Reasons 20 MG TABS tablet TAKE ONE TABLET BY MOUTH EVERY DAY with SUPPER (Patient taking  differently: TAKE ONE TABLET BY MOUTH EVERY DAY WITH SUPPER) 30 tablet 3  . zinc gluconate 50 MG tablet Take 50 mg by mouth daily with supper.     Marland Kitchen acetaminophen (TYLENOL) 325 MG tablet Take 2 tablets (650 mg total) by mouth every 4 (four) hours as needed for headache or mild pain. (Patient not taking: Reported on 01/29/2015)    . metoprolol tartrate (LOPRESSOR) 25 MG tablet Take 25 mg by mouth every 2 (two) hours as needed (for heartrate stays over 120 for more than 30 minutes or for onset of afib).      No current facility-administered medications for this encounter.    Allergies  Allergen Reactions  . Adhesive [Tape] Hives, Itching and Dermatitis    From  monitor electrodes, even the sensitive skin ones  . Eliquis [Apixaban] Hives, Itching and Dermatitis    Has not confirmed yet per pt (04/22/14)  He believes it was the electrodes causing the issue because it was only underneath where electrodes from the monitor were placed    Social History   Social History  . Marital Status: Legally Separated    Spouse Name: [name]  . Number of Children: [name]  . Years of Education: [name]   Occupational History  . Not on file.   Social History Main Topics  . Smoking status: Former Smoker    Types: Cigarettes, Cigars  . Smokeless tobacco: Not on file  . Alcohol Use: Yes     Comment: wine at night to help relax  . Drug Use: No  . Sexual Activity: Not on file   Other Topics Concern  . Not on file   Social History Narrative    Family History  Problem Relation Age of Onset  . Hypertension Mother   . Heart failure Mother   . Diabetes Mother   . Heart attack Brother     ROS- All systems are reviewed and negative except as per the HPI above  Physical Exam: Filed Vitals:   01/29/15 1509  BP: 150/90  Pulse: 82  Height: 6\' 2"  (1.88 m)  Weight: 193 lb 6.4 oz (87.726 kg)    GEN- The patient is well appearing, alert and oriented x 3 today.   Head- normocephalic, atraumatic Eyes-  Sclera clear, conjunctiva pink Ears- hearing intact Oropharynx- clear Neck- supple, no JVP Lymph- no cervical lymphadenopathy Lungs- Clear to ausculation bilaterally, normal work of breathing Heart- Regular rate and rhythm, no murmurs, rubs or gallops, PMI not laterally displaced GI- soft, NT, ND, + BS Extremities- no clubbing, cyanosis, or edema MS- no significant deformity or atrophy Skin- no rash or lesion Psych- euthymic mood, full affect Neuro- strength and sensation are intact  EKG- NSR at 82 bpm, Pr int 176 ms, QRS 104 ms, QTc 439 ms.    Assessment and Plan: 1. Persistent afib Maintaining SR since second ablation Continue flecainide Continue  metoprolol Continue xarelto  2. HTN Mildly elevated today. Avoid salt  F/u with Dr. Rayann Heman as scheduled

## 2015-02-04 ENCOUNTER — Ambulatory Visit (INDEPENDENT_AMBULATORY_CARE_PROVIDER_SITE_OTHER): Payer: Commercial Managed Care - HMO | Admitting: *Deleted

## 2015-02-04 ENCOUNTER — Other Ambulatory Visit: Payer: Self-pay

## 2015-02-04 DIAGNOSIS — I480 Paroxysmal atrial fibrillation: Secondary | ICD-10-CM | POA: Diagnosis not present

## 2015-02-05 NOTE — Progress Notes (Signed)
Loop recorder 

## 2015-02-12 ENCOUNTER — Telehealth: Payer: Self-pay | Admitting: Cardiology

## 2015-02-12 ENCOUNTER — Other Ambulatory Visit: Payer: Self-pay | Admitting: *Deleted

## 2015-02-12 MED ORDER — RIVAROXABAN 20 MG PO TABS: 20.0000 mg | ORAL_TABLET | Freq: Every day | ORAL | Status: DC

## 2015-02-12 NOTE — Telephone Encounter (Signed)
Pt calling in to ask Dr Meda Coffee if he can resume back taking Lasix 20 mg po as needed for LEE.  Pt states that he went to the beach for a week, last week and noted that he has some LEE.  Pt states he didn't maintain a low sodium diet as he usually does, for he states "I splurged on vacation." Pt states other than having some mild edema in his lower extremities, he feels great from a cardiac perspective.  Pt states he has no cp, sob, dizziness, afib, or any other cardiac complaints at this time.   Pt states on vacation he stayed out in the heat for prolong periods of time, ate salty foods, walked a lot, and rode in a car for an extended period of time when going to and from vacation.  Pt states he has been on a low dose Lasix before, that was ordered by Dr Meda Coffee for the pt to take for LEE.  Pt requesting for Dr Meda Coffee to call in another order of Lasix.  Informed the pt that  Dr  Meda Coffee is currently out of the office this morning, but I will route this message to her for further review and recommendation of med requested and follow-up with the pt thereafter.  Pt verbalized understanding and agrees with this plan.

## 2015-02-12 NOTE — Telephone Encounter (Signed)
New message     Pt c/o swelling: STAT is pt has developed SOB within 24 hours  1. How long have you been experiencing swelling?  Couple of weeks  2. Where is the swelling located? ankles  3.  Are you currently taking a "fluid pill"? no  4.  Are you currently SOB?  no  5.  Have you traveled recently? Went to [place] last weekend

## 2015-02-13 MED ORDER — FUROSEMIDE 40 MG PO TABS: 40.0000 mg | ORAL_TABLET | Freq: Every day | ORAL | Status: DC

## 2015-02-13 NOTE — Telephone Encounter (Signed)
Ivy, I would send him a prescription for lasix 40 mg po daily - 30 pills with 2 refills. He should take it for 1 week and afterwards PRN for LE edema once daily.

## 2015-02-13 NOTE — Telephone Encounter (Signed)
Notified the pt that per Dr Meda Coffee, she would like for him to start taking lasix 40 mg po daily for one week only, then take lasix 40 mg po daily PRN for LE edema, thereafter----#30 with 2 refills.  Confirmed the pharmacy of choice with the pt.  Pt verbalized understanding and agrees with this place.

## 2015-02-13 NOTE — Telephone Encounter (Signed)
Left message for the pt to call back to endorse Dr Francesca Oman recommendations for pts complaints of LEE.

## 2015-02-14 ENCOUNTER — Ambulatory Visit (HOSPITAL_BASED_OUTPATIENT_CLINIC_OR_DEPARTMENT_OTHER): Payer: Commercial Managed Care - HMO | Attending: Cardiology

## 2015-02-14 VITALS — Ht 74.0 in | Wt 197.0 lb

## 2015-02-14 DIAGNOSIS — G4733 Obstructive sleep apnea (adult) (pediatric): Secondary | ICD-10-CM

## 2015-02-14 DIAGNOSIS — I509 Heart failure, unspecified: Secondary | ICD-10-CM

## 2015-02-14 DIAGNOSIS — I10 Essential (primary) hypertension: Secondary | ICD-10-CM | POA: Diagnosis not present

## 2015-02-14 DIAGNOSIS — G4719 Other hypersomnia: Secondary | ICD-10-CM

## 2015-02-14 DIAGNOSIS — I110 Hypertensive heart disease with heart failure: Secondary | ICD-10-CM | POA: Diagnosis not present

## 2015-02-14 DIAGNOSIS — R0683 Snoring: Secondary | ICD-10-CM | POA: Insufficient documentation

## 2015-02-15 LAB — CUP PACEART REMOTE DEVICE CHECK: MDC IDC SESS DTM: 20160917155211

## 2015-02-15 NOTE — Progress Notes (Signed)
[facility]link summary report received. Battery status OK. Normal device function. No new symptom episodes, tachy episodes, brady, or pause episodes. No new AF episodes, +Xarelto. Monthly summary reports and ROV with JA on 03/31/2015 at 11:00am.

## 2015-02-19 ENCOUNTER — Ambulatory Visit (INDEPENDENT_AMBULATORY_CARE_PROVIDER_SITE_OTHER): Payer: Commercial Managed Care - HMO | Admitting: Cardiology

## 2015-02-19 ENCOUNTER — Telehealth: Payer: Self-pay | Admitting: Cardiology

## 2015-02-19 ENCOUNTER — Encounter: Payer: Self-pay | Admitting: Cardiology

## 2015-02-19 VITALS — BP 102/78 | HR 90 | Ht 74.0 in | Wt 190.4 lb

## 2015-02-19 DIAGNOSIS — I5033 Acute on chronic diastolic (congestive) heart failure: Secondary | ICD-10-CM | POA: Diagnosis not present

## 2015-02-19 DIAGNOSIS — I480 Paroxysmal atrial fibrillation: Secondary | ICD-10-CM

## 2015-02-19 NOTE — Progress Notes (Signed)
Patient ID: Evan [name], male   DOB: 03-16-1966, 49 y.o.   MRN: [id number]     Primary Care Physician: Gara Kroner, MD Referring Physician: Dr. Derenda Fennel is a 49 y.o. male with a h/o persistent afib, last one one month ago for f/u in the afib clinic. He reports that he is ding well since the second ablation without  Any afib. No issues with swallowing or rt groin pain since procedure. He continues on flecainide 50 mg bid. No missed doses of xarelto.  Today, he denies symptoms of palpitations, chest pain, shortness of breath, orthopnea, PND, lower extremity edema, dizziness, presyncope, syncope, or neurologic sequela. The patient is tolerating medications without difficulties and is otherwise without complaint today.  He called the last week with new LE edema, started on lasix 40 mg po daily that resolved it.  Past Medical History  Diagnosis Date  . Hypertension   . Anxiety   . Psoriasis   . Atrial fibrillation     a. PVI 04/2014 Dr Rayann Heman b. failed medical therapy with Multaq  . Atrial flutter     a. CTI ablation 04/2014 Dr Rayann Heman  . S/P ablation of atrial fibrillation 12/24/14 12/25/2014   Past Surgical History  Procedure Laterality Date  . Hernia repair Right     with mesh  . Carpal tunnel release    . Tee without cardioversion [name] 05/06/2014    Procedure: TRANSESOPHAGEAL ECHOCARDIOGRAM (TEE);  Surgeon: Pixie Casino, MD;  Location: [facility] ENDOSCOPY;  Service: Cardiovascular;  Laterality: [name];  . Atrial fibrillation ablation [name] 05/07/2014    PVI and CTI by Dr Rayann Heman  . Loop recorder implant [name] 08/07/2014    Procedure: LOOP RECORDER IMPLANT;  Surgeon: Thompson Grayer, MD;  Location: [facility] CATH LAB;  Service: Cardiovascular;  Laterality: [name];  . Electrophysiologic study [name] 12/24/2014    Procedure: Atrial Fibrillation Ablation;  Surgeon: Thompson Grayer, MD;  Location: [place] CV LAB;  Service: Cardiovascular;  Laterality: [name];    Current Outpatient Prescriptions    Medication Sig Dispense Refill  . acetaminophen (TYLENOL) 325 MG tablet Take 2 tablets (650 mg total) by mouth every 4 (four) hours as needed for headache or mild pain. (Patient not taking: Reported on 01/29/2015)    . citalopram (CELEXA) 20 MG tablet Take 20 mg by mouth daily with lunch.   3  . clobetasol ointment (TEMOVATE) 4.88 % Apply 1 application topically daily as needed (psoriasis).     Marland Kitchen diltiazem (CARDIZEM SR) 120 MG 12 hr capsule Take 1 capsule (120 mg total) by mouth 2 (two) times daily. 180 capsule 3  . EYELID CLEANSERS EX Apply 1 application topically 3 (three) times a week.     . flecainide (TAMBOCOR) 50 MG tablet Take 1 tablet (50 mg total) by mouth 2 (two) times daily. 180 tablet 3  . fluocinonide cream (LIDEX) 8.91 % Apply 1 application topically daily as needed (psoriasis).   0  . furosemide (LASIX) 40 MG tablet Take 1 tablet (40 mg total) by mouth daily. For one week only, and afterwards take 40 mg once daily only as needed for LE edema 30 tablet 2  . ketoconazole (NIZORAL) 2 % shampoo     . lisinopril (PRINIVIL,ZESTRIL) 5 MG tablet Take 1 tablet (5 mg total) by mouth daily. (Patient taking differently: Take 5 mg by mouth daily with lunch. ) 90 tablet 3  . LORazepam (ATIVAN) 0.5 MG tablet Take 0.5 mg by mouth daily as needed  for anxiety.    . metoprolol tartrate (LOPRESSOR) 25 MG tablet Take 25 mg by mouth every 2 (two) hours as needed (for heartrate stays over 120 for more than 30 minutes or for onset of afib).     . mirtazapine (REMERON) 15 MG tablet Take 15 mg by mouth daily.  1  . Multiple Vitamin (MULTIVITAMIN WITH MINERALS) TABS Take 1 tablet by mouth daily with supper.     . pantoprazole (PROTONIX) 40 MG tablet Take 1 tablet (40 mg total) by mouth daily. 45 tablet 0  . Polyethyl Glycol-Propyl Glycol 0.4-0.3 % SOLN Place 1 drop into both eyes daily as needed (dry eyes).    . potassium gluconate 595 MG TABS tablet Take 595 mg by mouth daily with lunch.     . rivaroxaban  (XARELTO) 20 MG TABS tablet Take 1 tablet (20 mg total) by mouth daily with supper. 30 tablet 3  . zinc gluconate 50 MG tablet Take 50 mg by mouth daily with supper.      No current facility-administered medications for this visit.    Allergies  Allergen Reactions  . Adhesive [Tape] Hives, Itching and Dermatitis    From monitor electrodes, even the sensitive skin ones  . Eliquis [Apixaban] Hives, Itching and Dermatitis    Has not confirmed yet per pt (04/22/14)  He believes it was the electrodes causing the issue because it was only underneath where electrodes from the monitor were placed    Social History   Social History  . Marital Status: Legally Separated    Spouse Name: [name]  . Number of Children: [name]  . Years of Education: [name]   Occupational History  . Not on file.   Social History Main Topics  . Smoking status: Former Smoker    Types: Cigarettes, Cigars  . Smokeless tobacco: Not on file  . Alcohol Use: Yes     Comment: wine at night to help relax  . Drug Use: No  . Sexual Activity: Not on file   Other Topics Concern  . Not on file   Social History Narrative    Family History  Problem Relation Age of Onset  . Hypertension Mother   . Heart failure Mother   . Diabetes Mother   . Heart attack Brother     ROS- All systems are reviewed and negative except as per the HPI above  Physical Exam: Filed Vitals:   02/19/15 1319  BP: 102/78  Pulse: 90  Height: 6\' 2"  (1.88 m)  Weight: 190 lb 6.4 oz (86.365 kg)  SpO2: 95%    GEN- The patient is well appearing, alert and oriented x 3 today.   Head- normocephalic, atraumatic Eyes-  Sclera clear, conjunctiva pink Ears- hearing intact Oropharynx- clear Neck- supple, no JVP Lymph- no cervical lymphadenopathy Lungs- Clear to ausculation bilaterally, normal work of breathing Heart- Regular rate and rhythm, no murmurs, rubs or gallops, PMI not laterally displaced GI- soft, NT, ND, + BS Extremities- no clubbing,  cyanosis, or edema MS- no significant deformity or atrophy Skin- no rash or lesion Psych- euthymic mood, full affect Neuro- strength and sensation are intact  EKG- NSR at 82 bpm, Pr int 176 ms, QRS 104 ms, QTc 439 ms.    Assessment and Plan: 1. Persistent afib Maintaining SR since second ablation, LINQ monitor interrogated and no episodes.  Continue flecainide Continue metoprolol Continue xarelto  2. HTN Mildly elevated today. Avoid salt  3. Acute diastolic CHF - started on lasix 40 mg  po daily 5 days ago with resolution of LE edema, from now on just PRN Lasix.  F/u with Dr. Rayann Heman in 1 months, with me in 3 months with CMP prior to the visit.

## 2015-02-19 NOTE — Patient Instructions (Signed)
Medication Instructions:   Your physician recommends that you continue on your current medications as directed. Please refer to the Current Medication list given to you today.     Labwork:  3 MONTHS---PRIOR TO YOUR 3 MONTH FOLLOW-UP APPOINTMENT WITH DR NELSON---CMET     Follow-Up:  3 MONTHS WITH DR NELSON---PLEASE HAVE YOUR LAB WORK DONE PRIOR TO THIS OFFICE VISIT

## 2015-02-19 NOTE — Sleep Study (Signed)
   Patient Name: Evan [name], Evan [name] MRN: [id number] Study Date: 02/14/2015 Gender: Male D.O.B: [birth year] Age (years): 64 Referring Provider: Thompson Grayer Interpreting Physician: Fransico Him MD, ABSM RPSGT: Baxter Flattery  Height (inches): 74 Weight (lbs): 195 BMI: 25 Neck Size: 15.00  CLINICAL INFORMATION Sleep Study Type: NPSG  Indication for sleep study: Hypertension, OSA, Witnessed Apneas  Epworth Sleepiness Score: 9  Most recent polysomnogram dated 01/21/2015 revealed an AHI of 0.0/h and RDI of 54.5/h.  SLEEP STUDY TECHNIQUE As per the AASM Manual for the Scoring of Sleep and Associated Events v2.3 (April 2016) with a hypopnea requiring 4% desaturations.  The channels recorded and monitored were frontal, central and occipital EEG, electrooculogram (EOG), submentalis EMG (chin), nasal and oral airflow, thoracic and abdominal wall motion, anterior tibialis EMG, snore microphone, electrocardiogram, and pulse oximetry.  MEDICATIONS Patient's medications include: Celexa, Cardizem, Flecainide, Lasix, Lisinopril, Metoprolol, Ativan, Remeron, Protonix, Potassium, Xarelto . Medications self-administered by patient during sleep study : No sleep medicine administered.  SLEEP ARCHITECTURE The study was initiated at 10:56:36 PM and ended at 5:32:03 AM.  Sleep onset time was 0.7 minutes and the sleep efficiency was normal at 94.9%. The total sleep time was 375.2 minutes.  Stage REM latency was 63.0 minutes.  The patient spent 7.99% of the night in stage N1 sleep, 76.28% in stage N2 sleep, 0.00% in stage N3 and 15.72% in REM.  Alpha intrusion was absent.  Supine sleep was 62.65%.  RESPIRATORY PARAMETERS The overall apnea/hypopnea index (AHI) was 4.0 per hour. There were 10 total apneas, including 10 obstructive, 0 central and 0 mixed apneas. There were 15 hypopneas and 0 RERAs.  The AHI during Stage REM sleep was 10.2 per hour.  AHI while supine was 5.1 per hour.  The mean  oxygen saturation was 93.91%. The minimum SpO2 during sleep was 83.00%.  Loud snoring was noted during this study.  CARDIAC DATA The 2 lead EKG demonstrated sinus rhythm. The mean heart rate was 70.69 beats per minute. Other EKG findings include: None.  LEG MOVEMENT DATA The total PLMS were 43 with a resulting PLMS index of 6.88. Associated arousal with leg movement index was 0.0 .  IMPRESSIONS No significant obstructive sleep apnea occurred during this study (AHI = 4.0/h). No significant central sleep apnea occurred during this study (CAI = 0.0/h). Mild oxygen desaturation was noted during this study (Min O2 = 83.00%). The patient snored with Loud snoring volume. No cardiac abnormalities were noted during this study. Mild periodic limb movements of sleep occurred during the study. No significant associated arousals.  RECOMMENDATIONS Avoid alcohol, sedatives and other CNS depressants that may result in sleep apnea and disrupt normal sleep architecture. Sleep hygiene should be reviewed to assess factors that may improve sleep quality. Weight management and regular exercise should be initiated or continued if appropriate.   Sueanne Margarita Diplomate, American Board of Sleep Medicine  ELECTRONICALLY SIGNED ON:  02/19/2015, 8:53 PM [place] PH: [phone]   FX: [phone] [place]

## 2015-02-19 NOTE — Telephone Encounter (Signed)
Please let patient know that sleep study showed no significant sleep apnea.    

## 2015-02-20 NOTE — Telephone Encounter (Signed)
Left message on private VM with results of no apnea. Instructed patient to call with any questions/to let me know he got my message

## 2015-02-24 ENCOUNTER — Encounter: Payer: Self-pay | Admitting: Internal Medicine

## 2015-03-05 ENCOUNTER — Ambulatory Visit (INDEPENDENT_AMBULATORY_CARE_PROVIDER_SITE_OTHER): Payer: Commercial Managed Care - HMO | Admitting: *Deleted

## 2015-03-05 DIAGNOSIS — I480 Paroxysmal atrial fibrillation: Secondary | ICD-10-CM | POA: Diagnosis not present

## 2015-03-07 NOTE — Progress Notes (Signed)
Loop recorder 

## 2015-03-19 ENCOUNTER — Encounter: Payer: Self-pay | Admitting: Internal Medicine

## 2015-03-19 ENCOUNTER — Ambulatory Visit (INDEPENDENT_AMBULATORY_CARE_PROVIDER_SITE_OTHER): Payer: Commercial Managed Care - HMO | Admitting: Internal Medicine

## 2015-03-19 VITALS — BP 112/84 | HR 79 | Ht 74.0 in | Wt 201.2 lb

## 2015-03-19 DIAGNOSIS — I480 Paroxysmal atrial fibrillation: Secondary | ICD-10-CM

## 2015-03-19 LAB — CUP PACEART INCLINIC DEVICE CHECK: MDC IDC SESS DTM: 20161019164607

## 2015-03-19 NOTE — Patient Instructions (Addendum)
Medication Instructions:  Your physician has recommended you make the following change in your medication:  1) Stop Flecainide 2) Stop Diltiazem    Labwork: None ordered   Testing/Procedures: None ordered   Follow-Up: Your physician wants you to follow-up in: 6 months with Dr Evan [name] Bast will receive a reminder letter in the mail two months in advance. If you don't receive a letter, please call our office to schedule the follow-up appointment.    Any Other Special Instructions Will Be Listed Below (If Applicable).

## 2015-03-19 NOTE — Progress Notes (Signed)
Electrophysiology Office Note   Date:  03/19/2015   ID:  Jeromiah, Ohalloran [birth year], MRN [id number]  PCP:  Gara Kroner, MD  Cardiologist:  Dr Meda Coffee Primary Electrophysiologist: Thompson Grayer, MD    Chief Complaint  Patient presents with  . Persistent AFIB     History of Present Illness: Evan [name] is a 49 y.o. male who presents today for electrophysiology evaluation.   He has done very well since his afib ablation without afib.  He denies procedure related complications. Today, he denies symptoms of palpitations, orthopnea, PND, lower extremity edema, claudication, dizziness, presyncope, syncope, bleeding, or neurologic sequela. The patient is tolerating medications without difficulties and is otherwise without complaint today.    Past Medical History  Diagnosis Date  . Hypertension   . Anxiety   . Psoriasis   . Atrial fibrillation ([place])     a. PVI 04/2014 Dr Rayann Heman b. failed medical therapy with Multaq  . Atrial flutter ([place])     a. CTI ablation 04/2014 Dr Rayann Heman  . S/P ablation of atrial fibrillation 12/24/14 12/25/2014   Past Surgical History  Procedure Laterality Date  . Hernia repair Right     with mesh  . Carpal tunnel release    . Tee without cardioversion [name] 05/06/2014    Procedure: TRANSESOPHAGEAL ECHOCARDIOGRAM (TEE);  Surgeon: Pixie Casino, MD;  Location: [facility] ENDOSCOPY;  Service: Cardiovascular;  Laterality: [name];  . Atrial fibrillation ablation [name] 05/07/2014    PVI and CTI by Dr Rayann Heman  . Loop recorder implant [name] 08/07/2014    Procedure: LOOP RECORDER IMPLANT;  Surgeon: Thompson Grayer, MD;  Location: [facility] CATH LAB;  Service: Cardiovascular;  Laterality: [name];  . Electrophysiologic study [name] 12/24/2014    Procedure: Atrial Fibrillation Ablation;  Surgeon: Thompson Grayer, MD;  Location: [place] CV LAB;  Service: Cardiovascular;  Laterality: [name];     Current Outpatient Prescriptions  Medication Sig Dispense Refill  . clobetasol ointment (TEMOVATE)  0.09 % Apply 1 application topically daily as needed (psoriasis).     . EYELID CLEANSERS EX Apply 1 application topically 3 (three) times a week.     . fluocinonide cream (LIDEX) 2.33 % Apply 1 application topically daily as needed (psoriasis).   0  . furosemide (LASIX) 40 MG tablet Take 40 mg by mouth daily as needed (swelling).    Marland Kitchen ketoconazole (NIZORAL) 2 % shampoo as directed.     Marland Kitchen lisinopril (PRINIVIL,ZESTRIL) 5 MG tablet Take 1 tablet (5 mg total) by mouth daily. (Patient taking differently: Take 5 mg by mouth daily with lunch. ) 90 tablet 3  . LORazepam (ATIVAN) 0.5 MG tablet Take 0.5 mg by mouth daily as needed for anxiety.    . metoprolol tartrate (LOPRESSOR) 25 MG tablet Take 25 mg by mouth every 2 (two) hours as needed (for heartrate stays over 120 for more than 30 minutes or for onset of afib).     . mirtazapine (REMERON) 15 MG tablet Take 15 mg by mouth daily.  1  . Multiple Vitamin (MULTIVITAMIN WITH MINERALS) TABS Take 1 tablet by mouth daily with supper.     . pantoprazole (PROTONIX) 40 MG tablet Take 40 mg by mouth daily as needed (heartburn).    Vladimir Faster Glycol-Propyl Glycol 0.4-0.3 % SOLN Place 1 drop into both eyes daily as needed (dry eyes).    . potassium gluconate 595 MG TABS tablet Take 595 mg by mouth daily with lunch.     . rivaroxaban (XARELTO) 20  MG TABS tablet Take 1 tablet (20 mg total) by mouth daily with supper. 30 tablet 3  . zinc gluconate 50 MG tablet Take 50 mg by mouth daily with supper.      No current facility-administered medications for this visit.    Allergies:   Adhesive and Eliquis   Social History:  The patient  reports that he has quit smoking. His smoking use included Cigarettes and Cigars. He does not have any smokeless tobacco history on file. He reports that he drinks alcohol. He reports that he does not use illicit drugs.   Family History:  The patient's  family history includes Diabetes in his mother; Heart attack in his brother; Heart  failure in his mother; Hypertension in his mother.    ROS:  Please see the history of present illness.   All other systems are reviewed and negative.    PHYSICAL EXAM: VS:  BP 112/84 mmHg  Pulse 79  Ht 6\' 2"  (1.88 m)  Wt 201 lb 3.2 oz (91.264 kg)  BMI 25.82 kg/m2 , BMI Body mass index is 25.82 kg/(m^2). GEN: Well nourished, well developed, in no acute distress HEENT: normal Neck: no JVD, carotid bruits, or masses Cardiac: RRR; no murmurs, rubs, or gallops,no edema  Respiratory:  clear to auscultation bilaterally, normal work of breathing GI: soft, nontender, nondistended, + BS MS: no deformity or atrophy Skin: warm and dry  Neuro:  Strength and sensation are intact Psych: euthymic mood, full affect  ekg today reveals sinus rhythm  ILR interrogation: Reveals sinus rhythm  Recent Labs: 10/24/2014: ALT 36; Magnesium 1.9 12/17/2014: BUN 8; Creatinine, Ser 0.72; Hemoglobin 13.5; Platelets 182.0; Potassium 3.8; Sodium 140    Lipid Panel     Component Value Date/Time   CHOL 148 10/24/2014 1038   TRIG 73.0 10/24/2014 1038   HDL 56.00 10/24/2014 1038   CHOLHDL 3 10/24/2014 1038   VLDL 14.6 10/24/2014 1038   LDLCALC 77 10/24/2014 1038     Wt Readings from Last 3 Encounters:  03/19/15 201 lb 3.2 oz (91.264 kg)  02/19/15 190 lb 6.4 oz (86.365 kg)  02/14/15 197 lb (89.359 kg)       ASSESSMENT AND PLAN:  1.  Paroxysmal atrial fibrillation Doing very well post ablation Stop diltiazem Stop flecainide Continue xarelto (chads2vasc score is 1) Consider stopping anticoagulation upon follow-up with Dr Meda Coffee in December if no afib at that time.  2. HTN Stable No change required today   Current medicines are reviewed at length with the patient today.   The patient does not have concerns regarding his medicines.  The following changes were made today:  none  Follow-up with Dr Meda Coffee in December as scheduled I will see again in 6 months  Signed, Thompson Grayer, MD    03/19/2015 10:27 PM     [place] [address] [phone] (office) [phone] (fax)

## 2015-03-25 ENCOUNTER — Encounter: Payer: Self-pay | Admitting: Internal Medicine

## 2015-03-25 LAB — CUP PACEART REMOTE DEVICE CHECK: Date Time Interrogation Session: 20161005193855

## 2015-03-25 NOTE — Progress Notes (Signed)
Carelink summary report received. Battery status OK. Normal device function. No new symptom episodes, tachy episodes, brady, or pause episodes. 1 AF episodes (burden 0.0%), +Xarelto, avg V rate controlled. Monthly summary reports and ROV JA on 09/22/15 at 11:45am.

## 2015-03-31 ENCOUNTER — Encounter: Payer: Self-pay | Admitting: Internal Medicine

## 2015-04-04 ENCOUNTER — Ambulatory Visit (INDEPENDENT_AMBULATORY_CARE_PROVIDER_SITE_OTHER): Payer: Commercial Managed Care - HMO | Admitting: *Deleted

## 2015-04-04 DIAGNOSIS — I480 Paroxysmal atrial fibrillation: Secondary | ICD-10-CM | POA: Diagnosis not present

## 2015-04-04 NOTE — Progress Notes (Signed)
Carelink Summary Report / Loop recorder 

## 2015-04-10 ENCOUNTER — Encounter: Payer: Self-pay | Admitting: Internal Medicine

## 2015-05-04 LAB — CUP PACEART REMOTE DEVICE CHECK: Date Time Interrogation Session: 20161104193550

## 2015-05-04 NOTE — Progress Notes (Signed)
Carelink summary report received. Battery status OK. Normal device function. No new symptom episodes, tachy episodes, brady, or pause episodes. No new AF episodes, +Xarelto and metoprolol. Monthly summary reports and ROV with JA on 09/22/15 at 11:45am.

## 2015-05-05 ENCOUNTER — Ambulatory Visit (INDEPENDENT_AMBULATORY_CARE_PROVIDER_SITE_OTHER): Payer: Commercial Managed Care - HMO | Admitting: *Deleted

## 2015-05-05 DIAGNOSIS — I480 Paroxysmal atrial fibrillation: Secondary | ICD-10-CM | POA: Diagnosis not present

## 2015-05-05 NOTE — Progress Notes (Signed)
Carelink Summary Report / Loop Recorder 

## 2015-05-07 ENCOUNTER — Other Ambulatory Visit: Payer: Self-pay | Admitting: *Deleted

## 2015-05-07 MED ORDER — FUROSEMIDE 40 MG PO TABS: 40.0000 mg | ORAL_TABLET | Freq: Every day | ORAL | Status: DC | PRN

## 2015-05-20 ENCOUNTER — Telehealth: Payer: Self-pay | Admitting: Cardiology

## 2015-05-20 NOTE — Telephone Encounter (Signed)
Notified the pt that per Dr Meda Coffee, she recommends that he decrease his caffeine intake and increase his fluid intake.  Pt verbalized understanding and agrees with this plan.

## 2015-05-20 NOTE — Telephone Encounter (Signed)
Pt c/o BP issue: STAT if pt c/o blurred vision, one-sided weakness or slurred speech  1. What are your last 5 BP readings? (12/17)-148/93 p 100, (12/19) 152/96 p 105  2. Are you having any other symptoms (ex. Dizziness, headache, blurred vision, passed out)? NO-   3. What is your BP issue? high

## 2015-05-20 NOTE — Telephone Encounter (Signed)
He should decrease caffeine and increase fluid intake

## 2015-05-20 NOTE — Telephone Encounter (Signed)
Pt calling to report the numbers mentioned below. Pt states he is completely asymptomatic with his BP and HR being slightly elevated.  Pt has an appt with Dr Meda Coffee on next Wednesday 05/28/15 for routine follow-up visit.  Pt states he does not feel like he's back in afib.  Pt states he did increase his caffeine intake a bit, for that is when he noticed his numbers change.  Advised the pt to avoid caffeine for the time being, maintain a low sodium diet, stay plenty hydrated, and avoid alcohol.  Informed the pt that I will update Dr Meda Coffee of his complaint and follow-up with any new recommendations.  Pt verbalized understanding and agrees with this plan.

## 2015-05-23 ENCOUNTER — Other Ambulatory Visit (INDEPENDENT_AMBULATORY_CARE_PROVIDER_SITE_OTHER): Payer: Commercial Managed Care - HMO | Admitting: *Deleted

## 2015-05-23 DIAGNOSIS — I10 Essential (primary) hypertension: Secondary | ICD-10-CM

## 2015-05-23 DIAGNOSIS — I4891 Unspecified atrial fibrillation: Secondary | ICD-10-CM | POA: Diagnosis not present

## 2015-05-23 LAB — COMPREHENSIVE METABOLIC PANEL
ALT: 32 U/L (ref 9–46)
AST: 44 U/L — ABNORMAL HIGH (ref 10–35)
Albumin: 4.1 g/dL (ref 3.6–5.1)
Alkaline Phosphatase: 62 U/L (ref 40–115)
BUN: 7 mg/dL (ref 7–25)
CO2: 25 mmol/L (ref 20–31)
Calcium: 9 mg/dL (ref 8.6–10.3)
Chloride: 102 mmol/L (ref 98–110)
Creat: 0.83 mg/dL (ref 0.70–1.33)
Glucose, Bld: 113 mg/dL — ABNORMAL HIGH (ref 65–99)
Potassium: 3.3 mmol/L — ABNORMAL LOW (ref 3.5–5.3)
Sodium: 138 mmol/L (ref 135–146)
Total Bilirubin: 1.5 mg/dL — ABNORMAL HIGH (ref 0.2–1.2)
Total Protein: 6.9 g/dL (ref 6.1–8.1)

## 2015-05-23 NOTE — Addendum Note (Signed)
Addended by: Eulis Foster on: 05/23/2015 11:51 AM   Modules accepted: Orders

## 2015-05-23 NOTE — Addendum Note (Signed)
Addended by: Eulis Foster on: 05/23/2015 07:39 AM   Modules accepted: Orders

## 2015-05-23 NOTE — Addendum Note (Signed)
Addended by: Eulis Foster on: 05/23/2015 07:38 AM   Modules accepted: Orders

## 2015-05-28 ENCOUNTER — Encounter: Payer: Self-pay | Admitting: Internal Medicine

## 2015-05-28 ENCOUNTER — Telehealth: Payer: Self-pay | Admitting: Cardiology

## 2015-05-28 ENCOUNTER — Encounter: Payer: Self-pay | Admitting: Cardiology

## 2015-05-28 ENCOUNTER — Ambulatory Visit (INDEPENDENT_AMBULATORY_CARE_PROVIDER_SITE_OTHER): Payer: Commercial Managed Care - HMO | Admitting: *Deleted

## 2015-05-28 ENCOUNTER — Ambulatory Visit (INDEPENDENT_AMBULATORY_CARE_PROVIDER_SITE_OTHER): Payer: Commercial Managed Care - HMO | Admitting: Cardiology

## 2015-05-28 VITALS — BP 126/90 | HR 80 | Ht 74.0 in | Wt 202.0 lb

## 2015-05-28 DIAGNOSIS — I5032 Chronic diastolic (congestive) heart failure: Secondary | ICD-10-CM | POA: Insufficient documentation

## 2015-05-28 DIAGNOSIS — I10 Essential (primary) hypertension: Secondary | ICD-10-CM | POA: Diagnosis not present

## 2015-05-28 DIAGNOSIS — I480 Paroxysmal atrial fibrillation: Secondary | ICD-10-CM | POA: Diagnosis not present

## 2015-05-28 DIAGNOSIS — E876 Hypokalemia: Secondary | ICD-10-CM

## 2015-05-28 LAB — CUP PACEART INCLINIC DEVICE CHECK: MDC IDC SESS DTM: 20161228173412

## 2015-05-28 MED ORDER — LISINOPRIL 5 MG PO TABS: 5.0000 mg | ORAL_TABLET | Freq: Every day | ORAL | Status: DC

## 2015-05-28 MED ORDER — ASPIRIN EC 81 MG PO TBEC: 81.0000 mg | DELAYED_RELEASE_TABLET | Freq: Every day | ORAL | Status: DC

## 2015-05-28 MED ORDER — METOPROLOL TARTRATE 25 MG PO TABS: 25.0000 mg | ORAL_TABLET | Freq: Two times a day (BID) | ORAL | Status: DC

## 2015-05-28 NOTE — Progress Notes (Signed)
Add-on loop check during appointment with Dr. Meda Coffee as patient felt like he was in AF. Battery status: good. R-waves 0.34mV. No symptom, tachy, pause, brady, or AF episodes. Monthly summary reports and ROV with JA on 09/22/2015 at 11:45am.

## 2015-05-28 NOTE — Progress Notes (Signed)
Patient ID: Evan [name], male   DOB: January 31, 1966, 49 y.o.   MRN: [id number]      Electrophysiology Office Note  Date:  05/28/2015   ID:  Mehkai, Isaguirre 1965-07-01, MRN [id number]  PCP:  Gara Kroner, MD  Cardiologist:  Dr Meda Coffee Primary Electrophysiologist: Dorothy Spark, MD    Chief complain: Plapitations   History of Present Illness: Evan [name] is a 49 y.o. male who presents today for electrophysiology evaluation.   He has done very well since his afib ablation without afib.  He denies procedure related complications. Today, he denies symptoms of palpitations, orthopnea, PND, lower extremity edema, claudication, dizziness, presyncope, syncope, bleeding, or neurologic sequela. The patient is tolerating medications without difficulties and is otherwise without complaint today.   The patient is coming after 6 months, he has been doing well until the last month when he experienced several episodes of hard heart beats but his heart rate felt ok. No chest pain, DOE, continues to drink beer, quit drinking wine. No syncope, no bleeding with Xarelto.   Past Medical History  Diagnosis Date  . Hypertension   . Anxiety   . Psoriasis   . Atrial fibrillation ([place])     a. PVI 04/2014 Dr Rayann Heman b. failed medical therapy with Multaq  . Atrial flutter ([place])     a. CTI ablation 04/2014 Dr Rayann Heman  . S/P ablation of atrial fibrillation 12/24/14 12/25/2014   Past Surgical History  Procedure Laterality Date  . Hernia repair Right     with mesh  . Carpal tunnel release    . Tee without cardioversion [name] 05/06/2014    Procedure: TRANSESOPHAGEAL ECHOCARDIOGRAM (TEE);  Surgeon: Pixie Casino, MD;  Location: [facility] ENDOSCOPY;  Service: Cardiovascular;  Laterality: [name];  . Atrial fibrillation ablation [name] 05/07/2014    PVI and CTI by Dr Rayann Heman  . Loop recorder implant [name] 08/07/2014    Procedure: LOOP RECORDER IMPLANT;  Surgeon: Thompson Grayer, MD;  Location: [facility] CATH LAB;  Service:  Cardiovascular;  Laterality: [name];  . Electrophysiologic study [name] 12/24/2014    Procedure: Atrial Fibrillation Ablation;  Surgeon: Thompson Grayer, MD;  Location: [place] CV LAB;  Service: Cardiovascular;  Laterality: [name];     Current Outpatient Prescriptions  Medication Sig Dispense Refill  . clobetasol ointment (TEMOVATE) AB-123456789 % Apply 1 application topically daily as needed (psoriasis).     . EYELID CLEANSERS EX Apply 1 application topically 3 (three) times a week.     . fluocinonide cream (LIDEX) AB-123456789 % Apply 1 application topically daily as needed (psoriasis).   0  . ketoconazole (NIZORAL) 2 % shampoo as directed.     Marland Kitchen lisinopril (PRINIVIL,ZESTRIL) 5 MG tablet Take 1 tablet (5 mg total) by mouth daily. (Patient taking differently: Take 5 mg by mouth daily with lunch. ) 90 tablet 3  . LORazepam (ATIVAN) 0.5 MG tablet Take 0.5 mg by mouth daily as needed for anxiety.    . mirtazapine (REMERON) 15 MG tablet Take 15 mg by mouth daily.  1  . Multiple Vitamin (MULTIVITAMIN WITH MINERALS) TABS Take 1 tablet by mouth daily with supper.     . pantoprazole (PROTONIX) 40 MG tablet Take 40 mg by mouth daily as needed (heartburn).    Vladimir Faster Glycol-Propyl Glycol 0.4-0.3 % SOLN Place 1 drop into both eyes daily as needed (dry eyes).    . potassium gluconate 595 MG TABS tablet Take 595 mg by mouth daily with lunch.     Marland Kitchen  rivaroxaban (XARELTO) 20 MG TABS tablet Take 1 tablet (20 mg total) by mouth daily with supper. 30 tablet 3  . zinc gluconate 50 MG tablet Take 50 mg by mouth daily with supper.     . furosemide (LASIX) 40 MG tablet Take 1 tablet (40 mg total) by mouth daily as needed (swelling). (Patient not taking: Reported on 05/28/2015) 30 tablet 1  . metoprolol tartrate (LOPRESSOR) 25 MG tablet Take 25 mg by mouth every 2 (two) hours as needed (for heartrate stays over 120 for more than 30 minutes or for onset of afib). Reported on 05/28/2015     No current facility-administered medications  for this visit.    Allergies:   Adhesive and Eliquis   Social History:  The patient  reports that he has quit smoking. His smoking use included Cigarettes and Cigars. He does not have any smokeless tobacco history on file. He reports that he drinks alcohol. He reports that he does not use illicit drugs.   Family History:  The patient's  family history includes Diabetes in his mother; Heart attack in his brother; Heart failure in his mother; Hypertension in his mother.    ROS:  Please see the history of present illness.   All other systems are reviewed and negative.    PHYSICAL EXAM: VS:  BP 126/90 mmHg  Pulse 80  Ht 6\' 2"  (1.88 m)  Wt 202 lb (91.627 kg)  BMI 25.92 kg/m2 , BMI Body mass index is 25.92 kg/(m^2). GEN: Well nourished, well developed, in no acute distress HEENT: normal Neck: no JVD, carotid bruits, or masses Cardiac: RRR; no murmurs, rubs, or gallops,no edema  Respiratory:  clear to auscultation bilaterally, normal work of breathing GI: soft, nontender, nondistended, + BS MS: no deformity or atrophy Skin: warm and dry  Neuro:  Strength and sensation are intact Psych: euthymic mood, full affect  ekg today reveals sinus rhythm  ILR interrogation: Reveals sinus rhythm  Recent Labs: 10/24/2014: Magnesium 1.9 12/17/2014: Hemoglobin 13.5; Platelets 182.0 05/23/2015: ALT 32; BUN 7; Creat 0.83; Potassium 3.3*; Sodium 138    Lipid Panel     Component Value Date/Time   CHOL 148 10/24/2014 1038   TRIG 73.0 10/24/2014 1038   HDL 56.00 10/24/2014 1038   CHOLHDL 3 10/24/2014 1038   VLDL 14.6 10/24/2014 1038   LDLCALC 77 10/24/2014 1038     Wt Readings from Last 3 Encounters:  05/28/15 202 lb (91.627 kg)  03/19/15 201 lb 3.2 oz (91.264 kg)  02/19/15 190 lb 6.4 oz (86.365 kg)       ASSESSMENT AND PLAN:  1.  Paroxysmal atrial fibrillation - LINQ interrogation showed no episodes Doing very well post ablation Stoped diltiazem and flecainide. Discontinue xarelto  (chads2vasc score is 1). Start ASA 81 mg po daily.  2. HTN Elevated at home.  Start metoprolol 25 mg po BID.  3. Hypokalemia - 3.3, advised to increase potassium gluconate to BID x 1 week.  4. Chronic diastolic CHF - now euvolemic, takes Lasix occassionaly, advised to take an extra K with lasix  I will see again in 6 months  Signed, Dorothy Spark, MD  05/28/2015 1:39 PM     [place] [address] [phone] (office) [phone] (fax)

## 2015-05-28 NOTE — Telephone Encounter (Signed)
Sent refill, to be put on hold, note to pharmacy

## 2015-05-28 NOTE — Telephone Encounter (Signed)
New message   *STAT* If patient is at the pharmacy, call can be transferred to refill team.   1. Which medications need to be refilled? (please list name of each medication and dose if known) lisinopril (PRINIVIL,ZESTRIL) 5 MG tablet   2. Which pharmacy/location (including street and city if local pharmacy) is medication to be sent to? [place] [address] [place]   3. Do they need a 30 day or 90 day supply? 90 day supply

## 2015-05-28 NOTE — Patient Instructions (Addendum)
Medication Instructions:   DR Meda Coffee WOULD LIKE FOR YOU TO DOUBLE YOUR POTASSIUM GLUCONATE FOR ONE WEEK, THEN PROCEED BACK WITH YOUR NORMAL REGIMEN--THIS IS BASED ON YOUR RECENT LAB RESULTS FROM LAST WEEK  STOP TAKING XARELTO NOW  START TAKING ASPRIN 63 MG ONCE DAILY  START TAKING METOPROLOL TARTRATE 25 MG TWICE DAILY    Follow-Up:  Your physician wants you to follow-up in: [place] will receive a reminder letter in the mail two months in advance. If you don't receive a letter, please call our office to schedule the follow-up appointment.'     If you need a refill on your cardiac medications before your next appointment, please call your pharmacy.

## 2015-05-31 ENCOUNTER — Other Ambulatory Visit: Payer: Self-pay | Admitting: Cardiology

## 2015-06-03 ENCOUNTER — Ambulatory Visit (INDEPENDENT_AMBULATORY_CARE_PROVIDER_SITE_OTHER): Payer: Commercial Managed Care - HMO | Admitting: *Deleted

## 2015-06-03 ENCOUNTER — Other Ambulatory Visit: Payer: Self-pay | Admitting: *Deleted

## 2015-06-03 DIAGNOSIS — I480 Paroxysmal atrial fibrillation: Secondary | ICD-10-CM | POA: Diagnosis not present

## 2015-06-03 MED ORDER — FUROSEMIDE 40 MG PO TABS: 40.0000 mg | ORAL_TABLET | Freq: Every day | ORAL | Status: DC | PRN

## 2015-06-03 NOTE — Telephone Encounter (Signed)
Rx request sent to pharmacy.  

## 2015-06-04 NOTE — Progress Notes (Signed)
v

## 2015-07-01 LAB — CUP PACEART REMOTE DEVICE CHECK: Date Time Interrogation Session: 20161204193745

## 2015-07-03 ENCOUNTER — Ambulatory Visit (INDEPENDENT_AMBULATORY_CARE_PROVIDER_SITE_OTHER): Payer: Commercial Managed Care - HMO | Admitting: *Deleted

## 2015-07-03 DIAGNOSIS — I480 Paroxysmal atrial fibrillation: Secondary | ICD-10-CM | POA: Diagnosis not present

## 2015-07-07 NOTE — Progress Notes (Signed)
Carelink Summary Report / Loop Recorder 

## 2015-07-09 LAB — CUP PACEART REMOTE DEVICE CHECK: Date Time Interrogation Session: 20170103200541

## 2015-08-04 ENCOUNTER — Ambulatory Visit (INDEPENDENT_AMBULATORY_CARE_PROVIDER_SITE_OTHER): Payer: Commercial Managed Care - HMO | Admitting: *Deleted

## 2015-08-04 DIAGNOSIS — I480 Paroxysmal atrial fibrillation: Secondary | ICD-10-CM | POA: Diagnosis not present

## 2015-08-05 LAB — CUP PACEART REMOTE DEVICE CHECK: Date Time Interrogation Session: 20170202201137

## 2015-08-05 NOTE — Progress Notes (Signed)
Carelink summary report received. Battery status OK. Normal device function. No new symptom episodes, tachy episodes, brady, or pause episodes. No new AF episodes. Monthly summary reports and ROV/PRN 

## 2015-08-08 NOTE — Progress Notes (Signed)
Carelink Summary Report / Loop Recorder 

## 2015-08-09 LAB — CUP PACEART REMOTE DEVICE CHECK: MDC IDC SESS DTM: 20170304204038

## 2015-08-09 NOTE — Progress Notes (Signed)
Carelink summary report received. Battery status OK. Normal device function. No new symptom episodes, tachy episodes, brady, or pause episodes. No new AF episodes. Monthly summary reports and ROV/PRN 

## 2015-09-01 ENCOUNTER — Ambulatory Visit (INDEPENDENT_AMBULATORY_CARE_PROVIDER_SITE_OTHER): Payer: Commercial Managed Care - HMO | Admitting: *Deleted

## 2015-09-01 DIAGNOSIS — I480 Paroxysmal atrial fibrillation: Secondary | ICD-10-CM | POA: Diagnosis not present

## 2015-09-02 NOTE — Progress Notes (Signed)
Carelink Summary Report / Loop Recorder 

## 2015-09-22 ENCOUNTER — Encounter: Payer: Self-pay | Admitting: Internal Medicine

## 2015-09-22 ENCOUNTER — Ambulatory Visit (INDEPENDENT_AMBULATORY_CARE_PROVIDER_SITE_OTHER): Payer: Commercial Managed Care - HMO | Admitting: Internal Medicine

## 2015-09-22 VITALS — BP 132/92 | HR 80 | Ht 74.0 in | Wt 192.5 lb

## 2015-09-22 DIAGNOSIS — I481 Persistent atrial fibrillation: Secondary | ICD-10-CM

## 2015-09-22 DIAGNOSIS — I4819 Other persistent atrial fibrillation: Secondary | ICD-10-CM

## 2015-09-22 DIAGNOSIS — I10 Essential (primary) hypertension: Secondary | ICD-10-CM | POA: Diagnosis not present

## 2015-09-22 LAB — CUP PACEART INCLINIC DEVICE CHECK: MDC IDC SESS DTM: 20170424152135

## 2015-09-22 MED ORDER — LISINOPRIL 10 MG PO TABS: 10.0000 mg | ORAL_TABLET | Freq: Every day | ORAL | Status: DC

## 2015-09-22 MED ORDER — METOPROLOL TARTRATE 25 MG PO TABS: 12.5000 mg | ORAL_TABLET | Freq: Two times a day (BID) | ORAL | Status: DC

## 2015-09-22 NOTE — Progress Notes (Signed)
Electrophysiology Office Note   Date:  09/22/2015   ID:  Dashun, Kostelnik 1965/11/30, MRN [id number]  PCP:  Gara Kroner, MD  Cardiologist:  Dr Meda Coffee Primary Electrophysiologist: Thompson Grayer, MD    Chief Complaint  Patient presents with  . Follow-up    no complaints     History of Present Illness: Evan [name] is a 50 y.o. male who presents today for electrophysiology evaluation.   He has done very well since his afib ablation without afib.   Today, he denies symptoms of palpitations, orthopnea, PND, lower extremity edema, claudication, dizziness, presyncope, syncope, bleeding, or neurologic sequela. The patient is tolerating medications without difficulties and is otherwise without complaint today.    Past Medical History  Diagnosis Date  . Hypertension   . Anxiety   . Psoriasis   . Atrial fibrillation ([place])     a. PVI 04/2014 Dr Rayann Heman b. failed medical therapy with Multaq  . Atrial flutter ([place])     a. CTI ablation 04/2014 Dr Rayann Heman  . S/P ablation of atrial fibrillation 12/24/14 12/25/2014   Past Surgical History  Procedure Laterality Date  . Hernia repair Right     with mesh  . Carpal tunnel release    . Tee without cardioversion [name] 05/06/2014    Procedure: TRANSESOPHAGEAL ECHOCARDIOGRAM (TEE);  Surgeon: Pixie Casino, MD;  Location: [facility] ENDOSCOPY;  Service: Cardiovascular;  Laterality: [name];  . Atrial fibrillation ablation [name] 05/07/2014    PVI and CTI by Dr Rayann Heman  . Loop recorder implant [name] 08/07/2014    Procedure: LOOP RECORDER IMPLANT;  Surgeon: Thompson Grayer, MD;  Location: [facility] CATH LAB;  Service: Cardiovascular;  Laterality: [name];  . Electrophysiologic study [name] 12/24/2014    Procedure: Atrial Fibrillation Ablation;  Surgeon: Thompson Grayer, MD;  Location: [place] CV LAB;  Service: Cardiovascular;  Laterality: [name];     Current Outpatient Prescriptions  Medication Sig Dispense Refill  . aspirin EC 81 MG tablet Take 1 tablet (81 mg total) by mouth  daily. 90 tablet 3  . clobetasol ointment (TEMOVATE) AB-123456789 % Apply 1 application topically daily as needed (psoriasis).     . EYELID CLEANSERS EX Apply 1 application topically 3 (three) times a week.     . fluocinonide cream (LIDEX) AB-123456789 % Apply 1 application topically daily as needed (psoriasis).   0  . furosemide (LASIX) 40 MG tablet Take 1 tablet (40 mg total) by mouth daily as needed (swelling). 30 tablet 1  . ketoconazole (NIZORAL) 2 % shampoo as directed.     Marland Kitchen lisinopril (PRINIVIL,ZESTRIL) 10 MG tablet Take 1 tablet (10 mg total) by mouth daily. 90 tablet 3  . LORazepam (ATIVAN) 0.5 MG tablet Take 0.5 mg by mouth daily as needed for anxiety.    . metoprolol tartrate (LOPRESSOR) 25 MG tablet Take 0.5 tablets (12.5 mg total) by mouth 2 (two) times daily. 180 tablet 3  . Multiple Vitamin (MULTIVITAMIN WITH MINERALS) TABS Take 1 tablet by mouth daily with supper.     Vladimir Faster Glycol-Propyl Glycol 0.4-0.3 % SOLN Place 1 drop into both eyes daily as needed (dry eyes).    . potassium gluconate 595 MG TABS tablet Take 595 mg by mouth daily with lunch.     . zinc gluconate 50 MG tablet Take 50 mg by mouth daily with supper.      No current facility-administered medications for this visit.    Allergies:   Adhesive and Eliquis   Social History:  The patient  reports that he has quit smoking. His smoking use included Cigarettes and Cigars. He does not have any smokeless tobacco history on file. He reports that he drinks alcohol. He reports that he does not use illicit drugs.   Family History:  The patient's  family history includes Diabetes in his mother; Heart attack in his brother; Heart failure in his mother; Hypertension in his mother.    ROS:  Please see the history of present illness.   All other systems are reviewed and negative.    PHYSICAL EXAM: VS:  BP 132/92 mmHg  Pulse 80  Ht 6\' 2"  (1.88 m)  Wt 192 lb 8 oz (87.317 kg)  BMI 24.70 kg/m2 , BMI Body mass index is 24.7  kg/(m^2). GEN: Well nourished, well developed, in no acute distress HEENT: normal Neck: no JVD, carotid bruits, or masses Cardiac: RRR; no murmurs, rubs, or gallops,no edema  Respiratory:  clear to auscultation bilaterally, normal work of breathing GI: soft, nontender, nondistended, + BS MS: no deformity or atrophy Skin: warm and dry  Neuro:  Strength and sensation are intact Psych: euthymic mood, full affect  ekg today reveals sinus rhythm  ILR interrogation: Reveals sinus rhythm  Recent Labs: 10/24/2014: Magnesium 1.9 12/17/2014: Hemoglobin 13.5; Platelets 182.0 05/23/2015: ALT 32; BUN 7; Creat 0.83; Potassium 3.3*; Sodium 138    Lipid Panel     Component Value Date/Time   CHOL 148 10/24/2014 1038   TRIG 73.0 10/24/2014 1038   HDL 56.00 10/24/2014 1038   CHOLHDL 3 10/24/2014 1038   VLDL 14.6 10/24/2014 1038   LDLCALC 77 10/24/2014 1038     Wt Readings from Last 3 Encounters:  09/22/15 192 lb 8 oz (87.317 kg)  05/28/15 202 lb (91.627 kg)  03/19/15 201 lb 3.2 oz (91.264 kg)       ASSESSMENT AND PLAN:  1.  Paroxysmal atrial fibrillation Doing very well post ablation off AAD therapy On asa (chads2vasc score is 1) Reduce metoprolol to 12.5mg  BID today (Fatigue) Could consider stopping this when he follows up with Dr Meda Coffee  2. HTN Increase lisinopril to 10mg  daily Weaning metoprolol to off   Current medicines are reviewed at length with the patient today.   The patient does not have concerns regarding his medicines.  The following changes were made today:  none  Follow-up with Dr Meda Coffee as scheduled I will see again in 6 months  Signed, Thompson Grayer, MD  09/22/2015 12:57 PM     [place] [address] [place] [address] [phone] (office) [phone] (fax)

## 2015-09-22 NOTE — Patient Instructions (Signed)
Medication Instructions:  Your physician has recommended you make the following change in your medication:  1) Increase Lisinopril to 10 mg daily 2) Decrease Metoprolol to 12.5 mg twice daily    Labwork: None ordered   Testing/Procedures: None ordered  Follow-Up: Your physician wants you to follow-up in: 6 months with Dr Vallery [place] will receive a reminder letter in the mail two months in advance. If you don't receive a letter, please call our office to schedule the follow-up appointment.   Any Other Special Instructions Will Be Listed Below (If Applicable).     If you need a refill on your cardiac medications before your next appointment, please call your pharmacy.

## 2015-10-01 ENCOUNTER — Ambulatory Visit (INDEPENDENT_AMBULATORY_CARE_PROVIDER_SITE_OTHER): Payer: Commercial Managed Care - HMO | Admitting: *Deleted

## 2015-10-01 DIAGNOSIS — I480 Paroxysmal atrial fibrillation: Secondary | ICD-10-CM

## 2015-10-02 NOTE — Progress Notes (Signed)
Carelink Summary Report / Loop Recorder 

## 2015-10-09 ENCOUNTER — Telehealth: Payer: Self-pay | Admitting: Cardiology

## 2015-10-09 DIAGNOSIS — I10 Essential (primary) hypertension: Secondary | ICD-10-CM

## 2015-10-09 DIAGNOSIS — I480 Paroxysmal atrial fibrillation: Secondary | ICD-10-CM

## 2015-10-09 NOTE — Telephone Encounter (Signed)
Pt called in and stated that his blood pressure 150/100 for the past week and a half. Patient wanted to know if his linq could record his blood pressure. I informed him that it doesn't recorded his blood pressure. Pt seen Dr. Rayann Heman on 09-22-15 and he made the follwing changes to his medicines 1) Increase Lisinopril to 10 mg daily 2) Decrease Metoprolol to 12.5 mg twice daily. Informed pt that I would send a message to primary cardiologist nurse and to EP MD nurse. Pt verbalized understanding.

## 2015-10-10 NOTE — Telephone Encounter (Signed)
Will route this message to Dr Meda Coffee for further review and recommendation if any.

## 2015-10-10 NOTE — Telephone Encounter (Signed)
I would increase lisinopril to 20 mg po daily and recheck BMP in 3 weeks.

## 2015-10-13 MED ORDER — LISINOPRIL 20 MG PO TABS: 20.0000 mg | ORAL_TABLET | Freq: Every day | ORAL | Status: DC

## 2015-10-13 NOTE — Addendum Note (Signed)
Addended by: Nuala Alpha on: 10/13/2015 08:26 AM   Modules accepted: Orders, Medications

## 2015-10-13 NOTE — Telephone Encounter (Signed)
Notified the pt that per Dr Meda Coffee, she recommends that we increase his lisinopril to 20 mg po daily and recheck a bmet in 3 weeks.  Scheduled the pts lab appt to check a bmet on 11/03/15 at our office.  Confirmed the pharmacy of choice with the pt.  Pt verbalized understanding and agrees with this plan.

## 2015-10-25 LAB — CUP PACEART REMOTE DEVICE CHECK: MDC IDC SESS DTM: 20170403203739

## 2015-10-25 NOTE — Progress Notes (Signed)
Carelink summary report received. Battery status OK. Normal device function. No new symptom episodes, tachy episodes, brady, or pause episodes. No new AF episodes. Monthly summary reports and ROV/PRN 

## 2015-10-31 ENCOUNTER — Ambulatory Visit (INDEPENDENT_AMBULATORY_CARE_PROVIDER_SITE_OTHER): Payer: Commercial Managed Care - HMO | Admitting: *Deleted

## 2015-10-31 DIAGNOSIS — I480 Paroxysmal atrial fibrillation: Secondary | ICD-10-CM | POA: Diagnosis not present

## 2015-11-03 ENCOUNTER — Other Ambulatory Visit (INDEPENDENT_AMBULATORY_CARE_PROVIDER_SITE_OTHER): Payer: Commercial Managed Care - HMO | Admitting: *Deleted

## 2015-11-03 DIAGNOSIS — I480 Paroxysmal atrial fibrillation: Secondary | ICD-10-CM

## 2015-11-03 DIAGNOSIS — I10 Essential (primary) hypertension: Secondary | ICD-10-CM

## 2015-11-03 LAB — BASIC METABOLIC PANEL
BUN: 15 mg/dL (ref 7–25)
CO2: 30 mmol/L (ref 20–31)
Calcium: 9.4 mg/dL (ref 8.6–10.3)
Chloride: 103 mmol/L (ref 98–110)
Creat: 0.82 mg/dL (ref 0.70–1.33)
Glucose, Bld: 73 mg/dL (ref 65–99)
Potassium: 4 mmol/L (ref 3.5–5.3)
Sodium: 141 mmol/L (ref 135–146)

## 2015-11-03 NOTE — Progress Notes (Signed)
Carelink Summary Report / Loop Recorder 

## 2015-11-09 LAB — CUP PACEART REMOTE DEVICE CHECK: MDC IDC SESS DTM: 20170503210821

## 2015-11-09 NOTE — Progress Notes (Signed)
Carelink summary report received. Battery status OK. Normal device function. No new symptom episodes, tachy episodes, brady, or pause episodes. No new AF episodes. Monthly summary reports and ROV/PRN 

## 2015-12-01 ENCOUNTER — Ambulatory Visit (INDEPENDENT_AMBULATORY_CARE_PROVIDER_SITE_OTHER): Payer: Commercial Managed Care - HMO | Admitting: *Deleted

## 2015-12-01 DIAGNOSIS — I480 Paroxysmal atrial fibrillation: Secondary | ICD-10-CM

## 2015-12-01 NOTE — Progress Notes (Signed)
Carelink Summary Report / Loop Recorder 

## 2015-12-08 LAB — CUP PACEART REMOTE DEVICE CHECK: MDC IDC SESS DTM: 20170702223606

## 2015-12-09 LAB — CUP PACEART REMOTE DEVICE CHECK: Date Time Interrogation Session: 20170602213845

## 2015-12-11 ENCOUNTER — Encounter: Payer: Self-pay | Admitting: Cardiology

## 2015-12-11 ENCOUNTER — Ambulatory Visit (INDEPENDENT_AMBULATORY_CARE_PROVIDER_SITE_OTHER): Payer: Commercial Managed Care - HMO | Admitting: Cardiology

## 2015-12-11 VITALS — BP 122/70 | HR 68 | Ht 74.0 in | Wt 199.0 lb

## 2015-12-11 DIAGNOSIS — E876 Hypokalemia: Secondary | ICD-10-CM

## 2015-12-11 DIAGNOSIS — I10 Essential (primary) hypertension: Secondary | ICD-10-CM | POA: Diagnosis not present

## 2015-12-11 DIAGNOSIS — I480 Paroxysmal atrial fibrillation: Secondary | ICD-10-CM

## 2015-12-11 DIAGNOSIS — I5033 Acute on chronic diastolic (congestive) heart failure: Secondary | ICD-10-CM

## 2015-12-11 NOTE — Progress Notes (Signed)
Patient ID: Evan [name], male   DOB: June 23, 1965, 50 y.o.   MRN: [id number]      Electrophysiology Office Note  Date:  12/11/2015   ID:  Evan, [name] 23-Mar-1966, MRN [id number]  PCP:  Evan Kroner, MD  Cardiologist:  Dr Evan [name] Primary Electrophysiologist: Ena Dawley, MD    Chief complain: Palpitations   History of Present Illness: Evan [name] is a 50 y.o. male who presents today for electrophysiology evaluation.   He has done very well since his afib ablation without afib.  He denies procedure related complications. Today, he denies symptoms of palpitations, orthopnea, PND, lower extremity edema, claudication, dizziness, presyncope, syncope, bleeding, or neurologic sequela. The patient is tolerating medications without difficulties and is otherwise without complaint today.   12/11/2015 - The patient is coming after 6 months, patient is doing well he denies any palpitations or syncope in the last 6 months also no chest or extremity edema orthopnea or proximal nocturnal dyspnea. He takes Lasix just as needed and he didn't need anything the last 6 months. He continues to take aspirin only. We'll increase of lisinopril his blood pressures is now controlled and he is asking if he can potentially discontinue metoprolol.  Past Medical History  Diagnosis Date  . Hypertension   . Anxiety   . Psoriasis   . Atrial fibrillation ([place])     a. PVI 04/2014 Dr Rayann Heman b. failed medical therapy with Multaq  . Atrial flutter ([place])     a. CTI ablation 04/2014 Dr Rayann Heman  . S/P ablation of atrial fibrillation 12/24/14 12/25/2014   Past Surgical History  Procedure Laterality Date  . Hernia repair Right     with mesh  . Carpal tunnel release    . Tee without cardioversion [name] 05/06/2014    Procedure: TRANSESOPHAGEAL ECHOCARDIOGRAM (TEE);  Surgeon: Pixie Casino, MD;  Location: [facility] ENDOSCOPY;  Service: Cardiovascular;  Laterality: [name];  . Atrial fibrillation ablation [name] 05/07/2014    PVI and  CTI by Dr Rayann Heman  . Loop recorder implant [name] 08/07/2014    Procedure: LOOP RECORDER IMPLANT;  Surgeon: Thompson Grayer, MD;  Location: [facility] CATH LAB;  Service: Cardiovascular;  Laterality: [name];  . Electrophysiologic study [name] 12/24/2014    Procedure: Atrial Fibrillation Ablation;  Surgeon: Thompson Grayer, MD;  Location: [facility] CV LAB;  Service: Cardiovascular;  Laterality: [name];     Current Outpatient Prescriptions  Medication Sig Dispense Refill  . Artificial Tear Solution (RA HYPOTONIC EYE DROPS OP) Apply to eye as directed.    Marland Kitchen aspirin EC 81 MG tablet Take 1 tablet (81 mg total) by mouth daily. 90 tablet 3  . clobetasol ointment (TEMOVATE) AB-123456789 % Apply 1 application topically daily as needed (psoriasis).     . EYELID CLEANSERS EX Apply 1 application topically 3 (three) times a week.     . fluocinonide cream (LIDEX) AB-123456789 % Apply 1 application topically daily as needed (psoriasis).   0  . furosemide (LASIX) 40 MG tablet Take 1 tablet (40 mg total) by mouth daily as needed (swelling). 30 tablet 1  . ketoconazole (NIZORAL) 2 % shampoo as directed.     Marland Kitchen lisinopril (PRINIVIL,ZESTRIL) 20 MG tablet Take 1 tablet (20 mg total) by mouth daily. 90 tablet 3  . LORazepam (ATIVAN) 0.5 MG tablet Take 0.5 mg by mouth daily as needed for anxiety.    . metoprolol tartrate (LOPRESSOR) 25 MG tablet Take 0.5 tablets (12.5 mg total) by mouth 2 (two) times daily.  180 tablet 3  . Multiple Vitamin (MULTIVITAMIN WITH MINERALS) TABS Take 1 tablet by mouth daily with supper.     Vladimir Faster Glycol-Propyl Glycol 0.4-0.3 % SOLN Place 1 drop into both eyes daily as needed (dry eyes).    . potassium gluconate 595 MG TABS tablet Take 595 mg by mouth daily with lunch.     . RESTASIS 0.05 % ophthalmic emulsion instill ONE DROP IN EACH EYE TWICE DAILY  3  . zinc gluconate 50 MG tablet Take 50 mg by mouth daily with supper.      No current facility-administered medications for this visit.    Allergies:   Adhesive and  Eliquis   Social History:  The patient  reports that he has quit smoking. His smoking use included Cigarettes and Cigars. He does not have any smokeless tobacco history on file. He reports that he drinks alcohol. He reports that he does not use illicit drugs.   Family History:  The patient's  family history includes Diabetes in his mother; Heart attack in his brother; Heart failure in his mother; Hypertension in his mother.    ROS:  Please see the history of present illness.   All other systems are reviewed and negative.    PHYSICAL EXAM: VS:  BP 122/70 mmHg  Pulse 68  Ht 6\' 2"  (1.88 m)  Wt 199 lb (90.266 kg)  BMI 25.54 kg/m2 , BMI Body mass index is 25.54 kg/(m^2). GEN: Well nourished, well developed, in no acute distress HEENT: normal Neck: no JVD, carotid bruits, or masses Cardiac: RRR; no murmurs, rubs, or gallops,no edema  Respiratory:  clear to auscultation bilaterally, normal work of breathing GI: soft, nontender, nondistended, + BS MS: no deformity or atrophy Skin: warm and dry  Neuro:  Strength and sensation are intact Psych: euthymic mood, full affect  ekg today reveals sinus rhythm  ILR interrogation: Reveals sinus rhythm  Recent Labs: 12/17/2014: Hemoglobin 13.5; Platelets 182.0 05/23/2015: ALT 32 11/03/2015: BUN 15; Creat 0.82; Potassium 4.0; Sodium 141    Lipid Panel     Component Value Date/Time   CHOL 148 10/24/2014 1038   TRIG 73.0 10/24/2014 1038   HDL 56.00 10/24/2014 1038   CHOLHDL 3 10/24/2014 1038   VLDL 14.6 10/24/2014 1038   LDLCALC 77 10/24/2014 1038     Wt Readings from Last 3 Encounters:  12/11/15 199 lb (90.266 kg)  09/22/15 192 lb 8 oz (87.317 kg)  05/28/15 202 lb (91.627 kg)     EKG performed today 12/14/2015 showed normal sinus rhythm normal EKG and no change from prior.  ASSESSMENT AND PLAN:  1.  Paroxysmal atrial fibrillation - LINQ interrogation showed no further episodes Doing very well post ablation Stoped diltiazem and  flecainide. Discontinued xarelto (chads2vasc score is 1). Continue ASA 81 mg po daily.  2. HTN Controlled On lisinopril 20 mg daily and metoprolol 12.5 mg by mouth twice a day we'll discontinue metoprolol.  3. Hypokalemia - 3.3, advised to increase potassium gluconate to BID x 1 week. Repeat potassium 4.0.  4. Chronic diastolic CHF - now euvolemic, takes Lasix occassionaly, advised to take an extra K with lasix  I will see again in 6 months  Signed, Ena Dawley, MD  12/11/2015 12:24 PM     [facility] [address] [place] [address] [phone] (office) [phone] (fax)

## 2015-12-11 NOTE — Patient Instructions (Signed)
Medication Instructions:   STOP TAKING METOPROLOL NOW    Follow-Up:  Your physician wants you to follow-up in: [place] will receive a reminder letter in the mail two months in advance. If you don't receive a letter, please call our office to schedule the follow-up appointment.       If you need a refill on your cardiac medications before your next appointment, please call your pharmacy.

## 2015-12-30 ENCOUNTER — Ambulatory Visit (INDEPENDENT_AMBULATORY_CARE_PROVIDER_SITE_OTHER): Payer: Commercial Managed Care - HMO | Admitting: *Deleted

## 2015-12-30 DIAGNOSIS — I480 Paroxysmal atrial fibrillation: Secondary | ICD-10-CM | POA: Diagnosis not present

## 2015-12-31 NOTE — Progress Notes (Signed)
Carelink Summary Report / Loop Recorder 

## 2016-01-08 LAB — CUP PACEART REMOTE DEVICE CHECK: MDC IDC SESS DTM: 20170801223858

## 2016-01-29 ENCOUNTER — Ambulatory Visit (INDEPENDENT_AMBULATORY_CARE_PROVIDER_SITE_OTHER): Payer: Commercial Managed Care - HMO | Admitting: *Deleted

## 2016-01-29 DIAGNOSIS — I480 Paroxysmal atrial fibrillation: Secondary | ICD-10-CM

## 2016-01-30 NOTE — Progress Notes (Signed)
Carelink Summary Report / Loop Recorder 

## 2016-02-20 ENCOUNTER — Encounter: Payer: Self-pay | Admitting: Internal Medicine

## 2016-02-20 ENCOUNTER — Telehealth: Payer: Self-pay | Admitting: Cardiology

## 2016-02-20 NOTE — Telephone Encounter (Signed)
Spoke w/ pt and he wanted to know if his symptom activator was working. Had pt send a manual transmission. Informed pt that the transmission was received and we could see no episodes that was sent by the symptom activator. Instructed pt to call tech services for further information about the symptom activator.

## 2016-02-20 NOTE — Telephone Encounter (Signed)
Pt called to report he pressed his button on his  [employer] and did not get a call back.  He usually gets a call back.   Pt wants to follow up on this.

## 2016-02-22 LAB — CUP PACEART REMOTE DEVICE CHECK: MDC IDC SESS DTM: 20170831230811

## 2016-02-22 NOTE — Progress Notes (Signed)
Carelink summary report received. Battery status OK. Normal device function. No new symptom episodes, tachy episodes, brady, or pause episodes. No new AF episodes. Monthly summary reports and ROV/PRN 

## 2016-03-01 ENCOUNTER — Ambulatory Visit (INDEPENDENT_AMBULATORY_CARE_PROVIDER_SITE_OTHER): Payer: Commercial Managed Care - HMO | Admitting: *Deleted

## 2016-03-01 DIAGNOSIS — I480 Paroxysmal atrial fibrillation: Secondary | ICD-10-CM | POA: Diagnosis not present

## 2016-03-01 NOTE — Progress Notes (Signed)
Carelink Summary Report / Loop Recorder 

## 2016-03-10 ENCOUNTER — Encounter: Payer: Self-pay | Admitting: Internal Medicine

## 2016-03-17 ENCOUNTER — Encounter: Payer: Self-pay | Admitting: Internal Medicine

## 2016-03-25 ENCOUNTER — Encounter: Payer: Self-pay | Admitting: Internal Medicine

## 2016-03-25 ENCOUNTER — Ambulatory Visit (INDEPENDENT_AMBULATORY_CARE_PROVIDER_SITE_OTHER): Payer: Commercial Managed Care - HMO | Admitting: Internal Medicine

## 2016-03-25 VITALS — BP 122/76 | HR 78 | Ht 74.0 in | Wt 194.0 lb

## 2016-03-25 DIAGNOSIS — I10 Essential (primary) hypertension: Secondary | ICD-10-CM

## 2016-03-25 DIAGNOSIS — I480 Paroxysmal atrial fibrillation: Secondary | ICD-10-CM

## 2016-03-25 LAB — CUP PACEART INCLINIC DEVICE CHECK: MDC IDC SESS DTM: 20171026124502

## 2016-03-25 MED ORDER — FUROSEMIDE 40 MG PO TABS: 40.0000 mg | ORAL_TABLET | Freq: Every day | ORAL | 3 refills | Status: DC | PRN

## 2016-03-25 NOTE — Progress Notes (Signed)
Electrophysiology Office Note   Date:  03/25/2016   ID:  Evan [name], Evan [name] [birth year], MRN [id number]  PCP:  Gara Kroner, MD  Cardiologist:  Dr Meda Coffee Primary Electrophysiologist: Thompson Grayer, MD    Chief Complaint  Patient presents with  . Atrial Fibrillation     History of Present Illness: Evan [name] is a 50 y.o. male who presents today for electrophysiology evaluation.   He has done very well since his afib ablation without afib.   Today, he denies symptoms of palpitations, orthopnea, PND, lower extremity edema, claudication, dizziness, presyncope, syncope, bleeding, or neurologic sequela. The patient is tolerating medications without difficulties and is otherwise without complaint today.    Past Medical History:  Diagnosis Date  . Anxiety   . Atrial fibrillation ([place])    a. PVI 04/2014 Dr Rayann Heman b. failed medical therapy with Multaq  . Atrial flutter ([place])    a. CTI ablation 04/2014 Dr Rayann Heman  . Hypertension   . Psoriasis   . S/P ablation of atrial fibrillation 12/24/14 12/25/2014   Past Surgical History:  Procedure Laterality Date  . ATRIAL FIBRILLATION ABLATION [name] 05/07/2014   PVI and CTI by Dr Rayann Heman  . CARPAL TUNNEL RELEASE    . ELECTROPHYSIOLOGIC STUDY [name] 12/24/2014   Procedure: Atrial Fibrillation Ablation;  Surgeon: Thompson Grayer, MD;  Location: [facility] CV LAB;  Service: Cardiovascular;  Laterality: [name];  . HERNIA REPAIR Right    with mesh  . LOOP RECORDER IMPLANT [name] 08/07/2014   Procedure: LOOP RECORDER IMPLANT;  Surgeon: Thompson Grayer, MD;  Location: [facility] CATH LAB;  Service: Cardiovascular;  Laterality: [name];  . TEE WITHOUT CARDIOVERSION [name] 05/06/2014   Procedure: TRANSESOPHAGEAL ECHOCARDIOGRAM (TEE);  Surgeon: Pixie Casino, MD;  Location: [facility] ENDOSCOPY;  Service: Cardiovascular;  Laterality: [name];     Current Outpatient Prescriptions  Medication Sig Dispense Refill  . aspirin EC 81 MG tablet Take 1 tablet (81 mg total) by mouth daily. 90  tablet 3  . clobetasol ointment (TEMOVATE) AB-123456789 % Apply 1 application topically daily as needed (psoriasis).     . EYELID CLEANSERS EX Apply 1 application topically 3 (three) times a week.     . fluocinonide cream (LIDEX) AB-123456789 % Apply 1 application topically daily as needed (psoriasis).   0  . furosemide (LASIX) 40 MG tablet Take 1 tablet (40 mg total) by mouth daily as needed (swelling). 30 tablet 1  . ketoconazole (NIZORAL) 2 % shampoo as directed.     Marland Kitchen lisinopril (PRINIVIL,ZESTRIL) 20 MG tablet Take 1 tablet (20 mg total) by mouth daily. 90 tablet 3  . LORazepam (ATIVAN) 0.5 MG tablet Take 0.5 mg by mouth daily as needed for anxiety.    . Multiple Vitamin (MULTIVITAMIN WITH MINERALS) TABS Take 1 tablet by mouth daily with supper.     . potassium gluconate 595 MG TABS tablet Take 595 mg by mouth daily with lunch.     . RESTASIS 0.05 % ophthalmic emulsion instill ONE DROP IN EACH EYE TWICE DAILY  3  . zinc gluconate 50 MG tablet Take 50 mg by mouth daily with supper.     . mirtazapine (REMERON) 15 MG tablet Take 15 mg by mouth at bedtime.  0   No current facility-administered medications for this visit.     Allergies:   Adhesive [tape] and Eliquis [apixaban]   Social History:  The patient  reports that he has quit smoking. His smoking use included Cigarettes and Cigars. He has never  used smokeless tobacco. He reports that he drinks alcohol. He reports that he does not use drugs.   Family History:  The patient's  family history includes Diabetes in his mother; Heart attack in his brother; Heart failure in his mother; Hypertension in his mother.    ROS:  Please see the history of present illness.   All other systems are reviewed and negative.    PHYSICAL EXAM: VS:  BP 122/76   Pulse 78   Ht 6\' 2"  (1.88 m)   Wt 194 lb (88 kg)   BMI 24.91 kg/m  , BMI Body mass index is 24.91 kg/m. GEN: Well nourished, well developed, in no acute distress  HEENT: normal  Neck: no JVD, carotid  bruits, or masses Cardiac: RRR; no murmurs, rubs, or gallops,no edema  Respiratory:  clear to auscultation bilaterally, normal work of breathing GI: soft, nontender, nondistended, + BS MS: no deformity or atrophy  Skin: warm and dry  Neuro:  Strength and sensation are intact Psych: euthymic mood, full affect   ILR interrogation: Reveals sinus rhythm  Recent Labs: 05/23/2015: ALT 32 11/03/2015: BUN 15; Creat 0.82; Potassium 4.0; Sodium 141    Lipid Panel     Component Value Date/Time   CHOL 148 10/24/2014 1038   TRIG 73.0 10/24/2014 1038   HDL 56.00 10/24/2014 1038   CHOLHDL 3 10/24/2014 1038   VLDL 14.6 10/24/2014 1038   LDLCALC 77 10/24/2014 1038     Wt Readings from Last 3 Encounters:  03/25/16 194 lb (88 kg)  12/11/15 199 lb (90.3 kg)  09/22/15 192 lb 8 oz (87.3 kg)       ASSESSMENT AND PLAN:  1.  Paroxysmal atrial fibrillation Doing very well post ablation off AAD therapy On asa (chads2vasc score is 1)  2. HTN Stable No change required today   Current medicines are reviewed at length with the patient today.   The patient does not have concerns regarding his medicines.  The following changes were made today:  none  Follow-up with Dr Meda Coffee as scheduled I will see again in 12 months carelink  Signed, Thompson Grayer, MD  03/25/2016 12:20 PM     [place] [address] [phone] (office) [phone] (fax)

## 2016-03-25 NOTE — Patient Instructions (Signed)
Medication Instructions:  Your physician recommends that you continue on your current medications as directed. Please refer to the Current Medication list given to you today.   Labwork: None ordered   Testing/Procedures: None ordered   Follow-Up: Your physician wants you to follow-up in: 12 months with Dr Allred You will receive a reminder letter in the mail two months in advance. If you don't receive a letter, please call our office to schedule the follow-up appointment.  Remote monitoring is used to monitor your Pacemaker  from home. This monitoring reduces the number of office visits required to check your device to one time per year. It allows us to keep an eye on the functioning of your device to ensure it is working properly. You are scheduled for a device check from home on 06/24/16. You may send your transmission at any time that day. If you have a wireless device, the transmission will be sent automatically. After your physician reviews your transmission, you will receive a postcard with your next transmission date.     Any Other Special Instructions Will Be Listed Below (If Applicable).     If you need a refill on your cardiac medications before your next appointment, please call your pharmacy.   

## 2016-03-29 ENCOUNTER — Ambulatory Visit (INDEPENDENT_AMBULATORY_CARE_PROVIDER_SITE_OTHER): Payer: Commercial Managed Care - HMO | Admitting: *Deleted

## 2016-03-29 DIAGNOSIS — I480 Paroxysmal atrial fibrillation: Secondary | ICD-10-CM | POA: Diagnosis not present

## 2016-03-30 NOTE — Progress Notes (Signed)
Carelink Summary Report / Loop Recorder 

## 2016-04-10 LAB — CUP PACEART REMOTE DEVICE CHECK
MDC IDC PG IMPLANT DT: 20160309
MDC IDC SESS DTM: 20170930234059

## 2016-04-10 NOTE — Progress Notes (Signed)
Carelink summary report received. Battery status OK. Normal device function. 1 symptom episode ECG previously reviewed. No tachy episodes, brady, or pause episodes. No new AF episodes. Monthly summary reports and ROV/PRN

## 2016-04-28 ENCOUNTER — Ambulatory Visit (INDEPENDENT_AMBULATORY_CARE_PROVIDER_SITE_OTHER): Payer: Commercial Managed Care - HMO | Admitting: *Deleted

## 2016-04-28 DIAGNOSIS — I480 Paroxysmal atrial fibrillation: Secondary | ICD-10-CM | POA: Diagnosis not present

## 2016-04-29 NOTE — Progress Notes (Signed)
Carelink Summary Report / Loop Recorder 

## 2016-05-04 LAB — CUP PACEART REMOTE DEVICE CHECK
Date Time Interrogation Session: 20171031000959
MDC IDC PG IMPLANT DT: 20160309

## 2016-05-04 IMAGING — CR DG CHEST 1V PORT
1 series · 1 of 1 positions shown · non-contrast
Comparison: Radiograph 01/09/2014

CLINICAL DATA: Heart palpitations

EXAM:
PORTABLE CHEST - 1 VIEW

[AP]
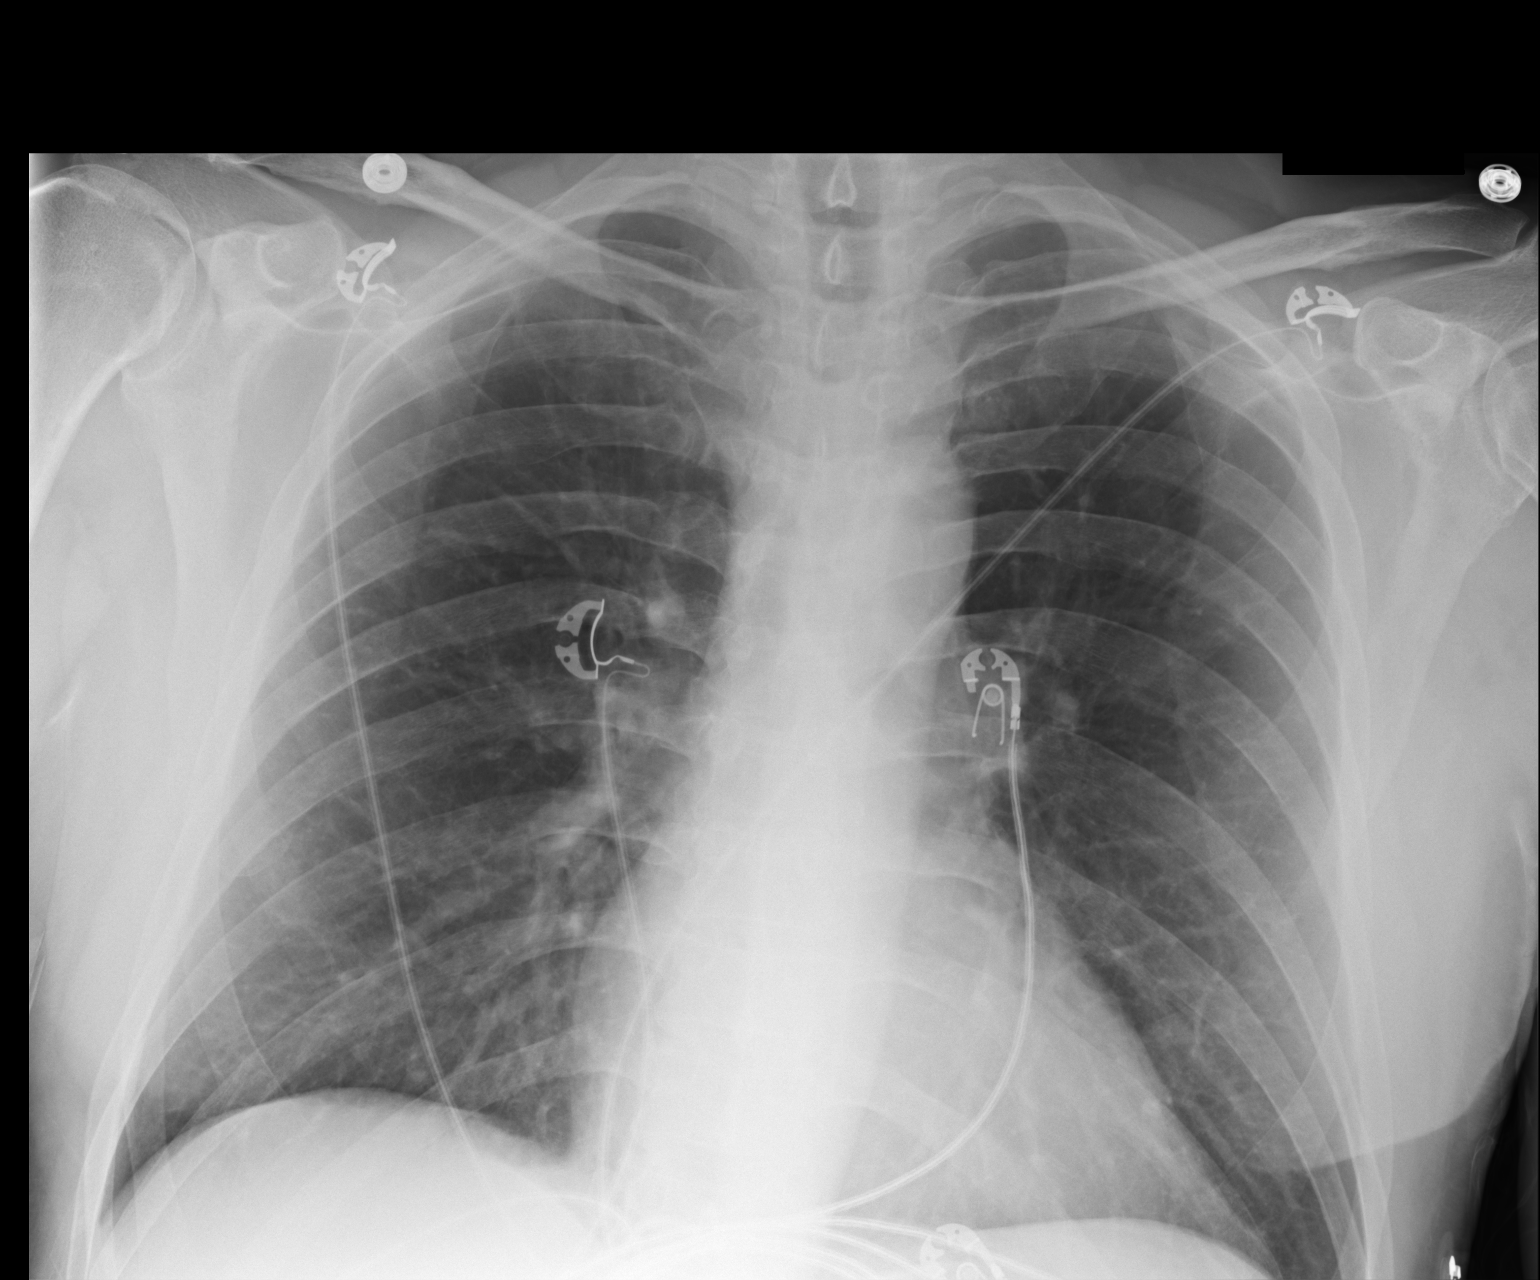

[1 of 1 positions shown; findings below may reference images not displayed]

FINDINGS: Normal mediastinum and cardiac silhouette. Normal pulmonary
vasculature. No evidence of effusion, infiltrate, or pneumothorax.
No acute bony abnormality.
IMPRESSION: No acute cardiopulmonary process.

## 2016-05-04 NOTE — Progress Notes (Signed)
Carelink summary report received. Battery status OK. Normal device function. No new symptom episodes, tachy episodes, brady, or pause episodes. No new AF episodes. Monthly summary reports and ROV/PRN 

## 2016-05-14 ENCOUNTER — Encounter: Payer: Self-pay | Admitting: Cardiology

## 2016-05-28 ENCOUNTER — Ambulatory Visit (INDEPENDENT_AMBULATORY_CARE_PROVIDER_SITE_OTHER): Payer: Commercial Managed Care - HMO | Admitting: *Deleted

## 2016-05-28 DIAGNOSIS — I480 Paroxysmal atrial fibrillation: Secondary | ICD-10-CM | POA: Diagnosis not present

## 2016-06-01 NOTE — Progress Notes (Signed)
Carelink Summary Report / Loop Recorder 

## 2016-06-03 DIAGNOSIS — H01024 Squamous blepharitis left upper eyelid: Secondary | ICD-10-CM | POA: Diagnosis not present

## 2016-06-03 DIAGNOSIS — H01021 Squamous blepharitis right upper eyelid: Secondary | ICD-10-CM | POA: Diagnosis not present

## 2016-06-03 DIAGNOSIS — H00024 Hordeolum internum left upper eyelid: Secondary | ICD-10-CM | POA: Diagnosis not present

## 2016-06-03 IMAGING — CR DG CHEST 1V PORT
1 series · 1 of 1 positions shown · non-contrast
Comparison: 02/17/2014

CLINICAL DATA: Atrial fibrillation.

EXAM:
PORTABLE CHEST - 1 VIEW

[AP]
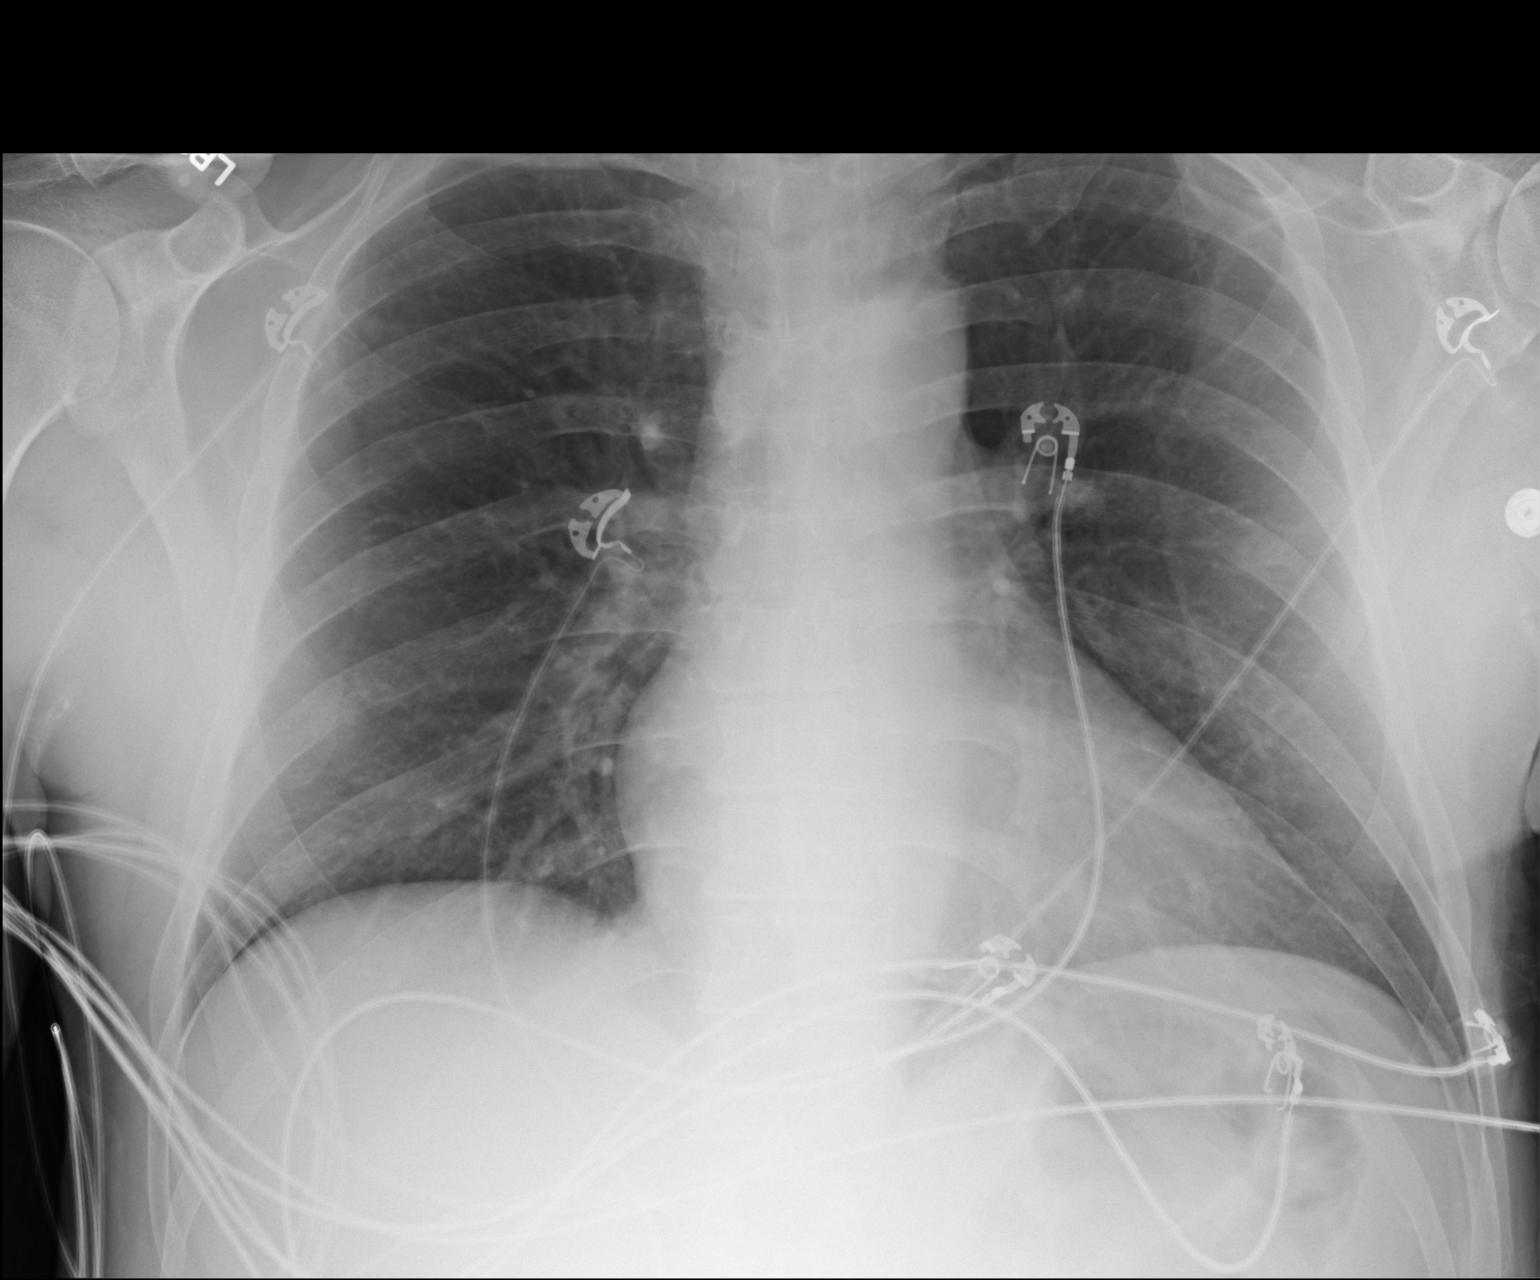

[1 of 1 positions shown; findings below may reference images not displayed]

FINDINGS: The heart size and mediastinal contours are within normal limits.
Both lungs are clear. The visualized skeletal structures are
unremarkable.
IMPRESSION: No active disease.

## 2016-06-04 ENCOUNTER — Ambulatory Visit (INDEPENDENT_AMBULATORY_CARE_PROVIDER_SITE_OTHER): Payer: Commercial Managed Care - HMO | Admitting: Cardiology

## 2016-06-04 ENCOUNTER — Encounter: Payer: Self-pay | Admitting: Cardiology

## 2016-06-04 VITALS — BP 122/78 | HR 83 | Ht 74.0 in | Wt 204.0 lb

## 2016-06-04 DIAGNOSIS — I10 Essential (primary) hypertension: Secondary | ICD-10-CM | POA: Diagnosis not present

## 2016-06-04 DIAGNOSIS — I5032 Chronic diastolic (congestive) heart failure: Secondary | ICD-10-CM

## 2016-06-04 DIAGNOSIS — I480 Paroxysmal atrial fibrillation: Secondary | ICD-10-CM | POA: Diagnosis not present

## 2016-06-04 MED ORDER — FUROSEMIDE 40 MG PO TABS: 40.0000 mg | ORAL_TABLET | Freq: Every day | ORAL | 3 refills | Status: DC | PRN

## 2016-06-04 MED ORDER — LISINOPRIL 20 MG PO TABS: 20.0000 mg | ORAL_TABLET | Freq: Every day | ORAL | 3 refills | Status: DC

## 2016-06-04 NOTE — Patient Instructions (Signed)

## 2016-06-04 NOTE — Progress Notes (Signed)
Patient ID: Evan [name], male   DOB: 04-15-1966, 51 y.o.   MRN: [id number]      Electrophysiology Office Note  Date:  06/04/2016   ID:  Darrin, Dervisevic 1965-08-24, MRN [id number]  PCP:  Gara Kroner, MD  Cardiologist:  Dr Meda Coffee Primary Electrophysiologist: Ena Dawley, MD    Chief complain: Palpitations   History of Present Illness: Evan [name] is a 51 y.o. male who presents today for electrophysiology evaluation.   He has done very well since his afib ablation without afib.  He denies procedure related complications. Today, he denies symptoms of palpitations, orthopnea, PND, lower extremity edema, claudication, dizziness, presyncope, syncope, bleeding, or neurologic sequela. The patient is tolerating medications without difficulties and is otherwise without complaint today.   12/11/2015 - The patient is coming after 6 months, patient is doing well he denies any palpitations or syncope in the last 6 months also no chest or extremity edema orthopnea or proximal nocturnal dyspnea. He takes Lasix just as needed and he didn't need anything the last 6 months. He continues to take aspirin only. We'll increase of lisinopril his blood pressures is now controlled and he is asking if he can potentially discontinue metoprolol.  06/04/2016 - the patient is coming after 6 months, he has had no recurrent episodes of atrial fibrillation he has been compliant with his medication and has no side effects. He denies any shortness of breath chest pain, no syncope. He is fairly active and has no symptoms. No lower extremity edema orthopnea or proximal nocturnal dyspnea.  Past Medical History:  Diagnosis Date  . Anxiety   . Atrial fibrillation ([place])    a. PVI 04/2014 Dr Rayann Heman b. failed medical therapy with Multaq  . Atrial flutter ([place])    a. CTI ablation 04/2014 Dr Rayann Heman  . Hypertension   . Psoriasis   . S/P ablation of atrial fibrillation 12/24/14 12/25/2014   Past Surgical History:    Procedure Laterality Date  . ATRIAL FIBRILLATION ABLATION [name] 05/07/2014   PVI and CTI by Dr Rayann Heman  . CARPAL TUNNEL RELEASE    . ELECTROPHYSIOLOGIC STUDY [name] 12/24/2014   Procedure: Atrial Fibrillation Ablation;  Surgeon: Thompson Grayer, MD;  Location: [place] CV LAB;  Service: Cardiovascular;  Laterality: [name];  . HERNIA REPAIR Right    with mesh  . LOOP RECORDER IMPLANT [name] 08/07/2014   Procedure: LOOP RECORDER IMPLANT;  Surgeon: Thompson Grayer, MD;  Location: [facility] CATH LAB;  Service: Cardiovascular;  Laterality: [name];  . TEE WITHOUT CARDIOVERSION [name] 05/06/2014   Procedure: TRANSESOPHAGEAL ECHOCARDIOGRAM (TEE);  Surgeon: Pixie Casino, MD;  Location: [facility] ENDOSCOPY;  Service: Cardiovascular;  Laterality: [name];     Current Outpatient Prescriptions  Medication Sig Dispense Refill  . aspirin EC 81 MG tablet Take 1 tablet (81 mg total) by mouth daily. 90 tablet 3  . cephALEXin (KEFLEX) 500 MG capsule Take 500 mg by mouth 2 (two) times daily.    . clobetasol ointment (TEMOVATE) AB-123456789 % Apply 1 application topically daily as needed (psoriasis).     . EYELID CLEANSERS EX Apply 1 application topically 3 (three) times a week.     . fluocinonide cream (LIDEX) AB-123456789 % Apply 1 application topically daily as needed (psoriasis).   0  . furosemide (LASIX) 40 MG tablet Take 1 tablet (40 mg total) by mouth daily as needed (swelling). 30 tablet 3  . ketoconazole (NIZORAL) 2 % shampoo as directed.     Marland Kitchen lisinopril (PRINIVIL,ZESTRIL) 20  MG tablet Take 1 tablet (20 mg total) by mouth daily. 90 tablet 3  . LORazepam (ATIVAN) 0.5 MG tablet Take 0.5 mg by mouth daily as needed for anxiety.    . mirtazapine (REMERON) 15 MG tablet Take 15 mg by mouth at bedtime.  0  . Multiple Vitamin (MULTIVITAMIN WITH MINERALS) TABS Take 1 tablet by mouth daily with supper.     . potassium gluconate 595 MG TABS tablet Take 595 mg by mouth daily with lunch.     . RESTASIS 0.05 % ophthalmic emulsion instill ONE DROP IN EACH EYE TWICE  DAILY  3  . zinc gluconate 50 MG tablet Take 50 mg by mouth daily with supper.      No current facility-administered medications for this visit.     Allergies:   Adhesive [tape] and Eliquis [apixaban]   Social History:  The patient  reports that he has quit smoking. His smoking use included Cigarettes and Cigars. He has never used smokeless tobacco. He reports that he drinks alcohol. He reports that he does not use drugs.   Family History:  The patient's  family history includes Diabetes in his mother; Heart attack in his brother; Heart failure in his mother; Hypertension in his mother.    ROS:  Please see the history of present illness.   All other systems are reviewed and negative.    PHYSICAL EXAM: VS:  BP 122/78   Pulse 83   Ht 6\' 2"  (1.88 m)   Wt 204 lb (92.5 kg)   BMI 26.19 kg/m  , BMI Body mass index is 26.19 kg/m. GEN: Well nourished, well developed, in no acute distress  HEENT: normal  Neck: no JVD, carotid bruits, or masses Cardiac: RRR; no murmurs, rubs, or gallops,no edema  Respiratory:  clear to auscultation bilaterally, normal work of breathing GI: soft, nontender, nondistended, + BS MS: no deformity or atrophy  Skin: warm and dry  Neuro:  Strength and sensation are intact Psych: euthymic mood, full affect  ekg today reveals sinus rhythm  ILR interrogation: Reveals sinus rhythm  Recent Labs: 11/03/2015: BUN 15; Creat 0.82; Potassium 4.0; Sodium 141    Lipid Panel     Component Value Date/Time   CHOL 148 10/24/2014 1038   TRIG 73.0 10/24/2014 1038   HDL 56.00 10/24/2014 1038   CHOLHDL 3 10/24/2014 1038   VLDL 14.6 10/24/2014 1038   LDLCALC 77 10/24/2014 1038     Wt Readings from Last 3 Encounters:  06/04/16 204 lb (92.5 kg)  03/25/16 194 lb (88 kg)  12/11/15 199 lb (90.3 kg)     EKG performed today 12/14/2015 showed normal sinus rhythm normal EKG and no change from prior.  ASSESSMENT AND PLAN:  1.  Paroxysmal atrial fibrillation - LINQ  interrogation showed no further episodes, last interrogated in October 2017. Doing very well post ablation. Stoped diltiazem and flecainide. Discontinued xarelto (chads2vasc score is 1). Continue ASA 81 mg po daily. Carelink summary report received. Battery status OK. Normal device function. No new symptom episodes, tachy episodes, brady, or pause episodes. No new AF episodes.   2. HTN  - controlled.  3. Chronic diastolic CHF - now euvolemic, takes Lasix occassionaly, hasn't used it in a while.  Follow up in 1 year.  Signed, Ena Dawley, MD  06/04/2016 3:14 PM     [place] [address] [phone] (office) [phone] (fax)

## 2016-06-08 LAB — CUP PACEART REMOTE DEVICE CHECK
MDC IDC PG IMPLANT DT: 20160309
MDC IDC SESS DTM: 20171130090808

## 2016-06-18 DIAGNOSIS — H0014 Chalazion left upper eyelid: Secondary | ICD-10-CM | POA: Diagnosis not present

## 2016-06-18 DIAGNOSIS — H01024 Squamous blepharitis left upper eyelid: Secondary | ICD-10-CM | POA: Diagnosis not present

## 2016-06-18 DIAGNOSIS — H01021 Squamous blepharitis right upper eyelid: Secondary | ICD-10-CM | POA: Diagnosis not present

## 2016-06-23 DIAGNOSIS — H01022 Squamous blepharitis right lower eyelid: Secondary | ICD-10-CM | POA: Diagnosis not present

## 2016-06-23 DIAGNOSIS — H01021 Squamous blepharitis right upper eyelid: Secondary | ICD-10-CM | POA: Diagnosis not present

## 2016-06-23 DIAGNOSIS — H0014 Chalazion left upper eyelid: Secondary | ICD-10-CM | POA: Diagnosis not present

## 2016-06-28 ENCOUNTER — Ambulatory Visit (INDEPENDENT_AMBULATORY_CARE_PROVIDER_SITE_OTHER): Payer: Commercial Managed Care - HMO | Admitting: *Deleted

## 2016-06-28 DIAGNOSIS — I480 Paroxysmal atrial fibrillation: Secondary | ICD-10-CM | POA: Diagnosis not present

## 2016-06-28 NOTE — Progress Notes (Signed)
Carelink Summary Report / Loop Recorder 

## 2016-07-12 LAB — CUP PACEART REMOTE DEVICE CHECK
Implantable Pulse Generator Implant Date: 20160309
MDC IDC SESS DTM: 20171230093957

## 2016-07-27 LAB — CUP PACEART REMOTE DEVICE CHECK
Date Time Interrogation Session: 20180129103649
Implantable Pulse Generator Implant Date: 20160309

## 2016-07-27 NOTE — Progress Notes (Signed)
Carelink summary report received. Battery status OK. Normal device function. No new symptom episodes, tachy episodes, brady, or pause episodes. No new AF episodes. Monthly summary reports and ROV/PRN 

## 2016-07-28 ENCOUNTER — Ambulatory Visit (INDEPENDENT_AMBULATORY_CARE_PROVIDER_SITE_OTHER): Payer: Commercial Managed Care - HMO | Admitting: *Deleted

## 2016-07-28 DIAGNOSIS — I480 Paroxysmal atrial fibrillation: Secondary | ICD-10-CM | POA: Diagnosis not present

## 2016-07-28 NOTE — Progress Notes (Signed)
Carelink Summary Report / Loop Recorder 

## 2016-08-13 LAB — CUP PACEART REMOTE DEVICE CHECK
Date Time Interrogation Session: 20180228103954
MDC IDC PG IMPLANT DT: 20160309

## 2016-08-27 ENCOUNTER — Ambulatory Visit (INDEPENDENT_AMBULATORY_CARE_PROVIDER_SITE_OTHER): Payer: Commercial Managed Care - HMO | Admitting: *Deleted

## 2016-08-27 DIAGNOSIS — I480 Paroxysmal atrial fibrillation: Secondary | ICD-10-CM | POA: Diagnosis not present

## 2016-08-27 NOTE — Progress Notes (Signed)
Carelink Summary Report / Loop Recorder 

## 2016-09-09 LAB — CUP PACEART REMOTE DEVICE CHECK
Implantable Pulse Generator Implant Date: 20160309
MDC IDC SESS DTM: 20180330103807

## 2016-09-16 DIAGNOSIS — H01024 Squamous blepharitis left upper eyelid: Secondary | ICD-10-CM | POA: Diagnosis not present

## 2016-09-16 DIAGNOSIS — H04123 Dry eye syndrome of bilateral lacrimal glands: Secondary | ICD-10-CM | POA: Diagnosis not present

## 2016-09-16 DIAGNOSIS — H01021 Squamous blepharitis right upper eyelid: Secondary | ICD-10-CM | POA: Diagnosis not present

## 2016-09-27 ENCOUNTER — Ambulatory Visit (INDEPENDENT_AMBULATORY_CARE_PROVIDER_SITE_OTHER): Payer: Commercial Managed Care - HMO | Admitting: *Deleted

## 2016-09-27 DIAGNOSIS — I480 Paroxysmal atrial fibrillation: Secondary | ICD-10-CM | POA: Diagnosis not present

## 2016-09-27 NOTE — Progress Notes (Signed)
Carelink Summary Report / Loop Recorder 

## 2016-10-10 LAB — CUP PACEART REMOTE DEVICE CHECK
Date Time Interrogation Session: 20180429113728
MDC IDC PG IMPLANT DT: 20160309

## 2016-10-10 NOTE — Progress Notes (Signed)
Carelink summary report received. Battery status OK. Normal device function. No new symptom episodes, tachy episodes, brady, or pause episodes. No new AF episodes. Monthly summary reports and ROV/PRN 

## 2016-10-12 DIAGNOSIS — H524 Presbyopia: Secondary | ICD-10-CM | POA: Diagnosis not present

## 2016-10-12 DIAGNOSIS — H52223 Regular astigmatism, bilateral: Secondary | ICD-10-CM | POA: Diagnosis not present

## 2016-10-12 DIAGNOSIS — H5213 Myopia, bilateral: Secondary | ICD-10-CM | POA: Diagnosis not present

## 2016-10-26 ENCOUNTER — Ambulatory Visit (INDEPENDENT_AMBULATORY_CARE_PROVIDER_SITE_OTHER): Payer: Commercial Managed Care - HMO | Admitting: *Deleted

## 2016-10-26 DIAGNOSIS — I480 Paroxysmal atrial fibrillation: Secondary | ICD-10-CM | POA: Diagnosis not present

## 2016-10-26 NOTE — Progress Notes (Signed)
Carelink Summary Report / Loop Recorder 

## 2016-10-28 LAB — CUP PACEART REMOTE DEVICE CHECK
Date Time Interrogation Session: 20180529114044
Implantable Pulse Generator Implant Date: 20160309

## 2016-10-28 IMAGING — CR DG CHEST 1V PORT
2 series · 2 of 2 positions shown · non-contrast
Comparison: 05/08/2014

CLINICAL DATA: Atrial fibrillation

EXAM:
PORTABLE CHEST - 1 VIEW

[portable (1 of 2)]
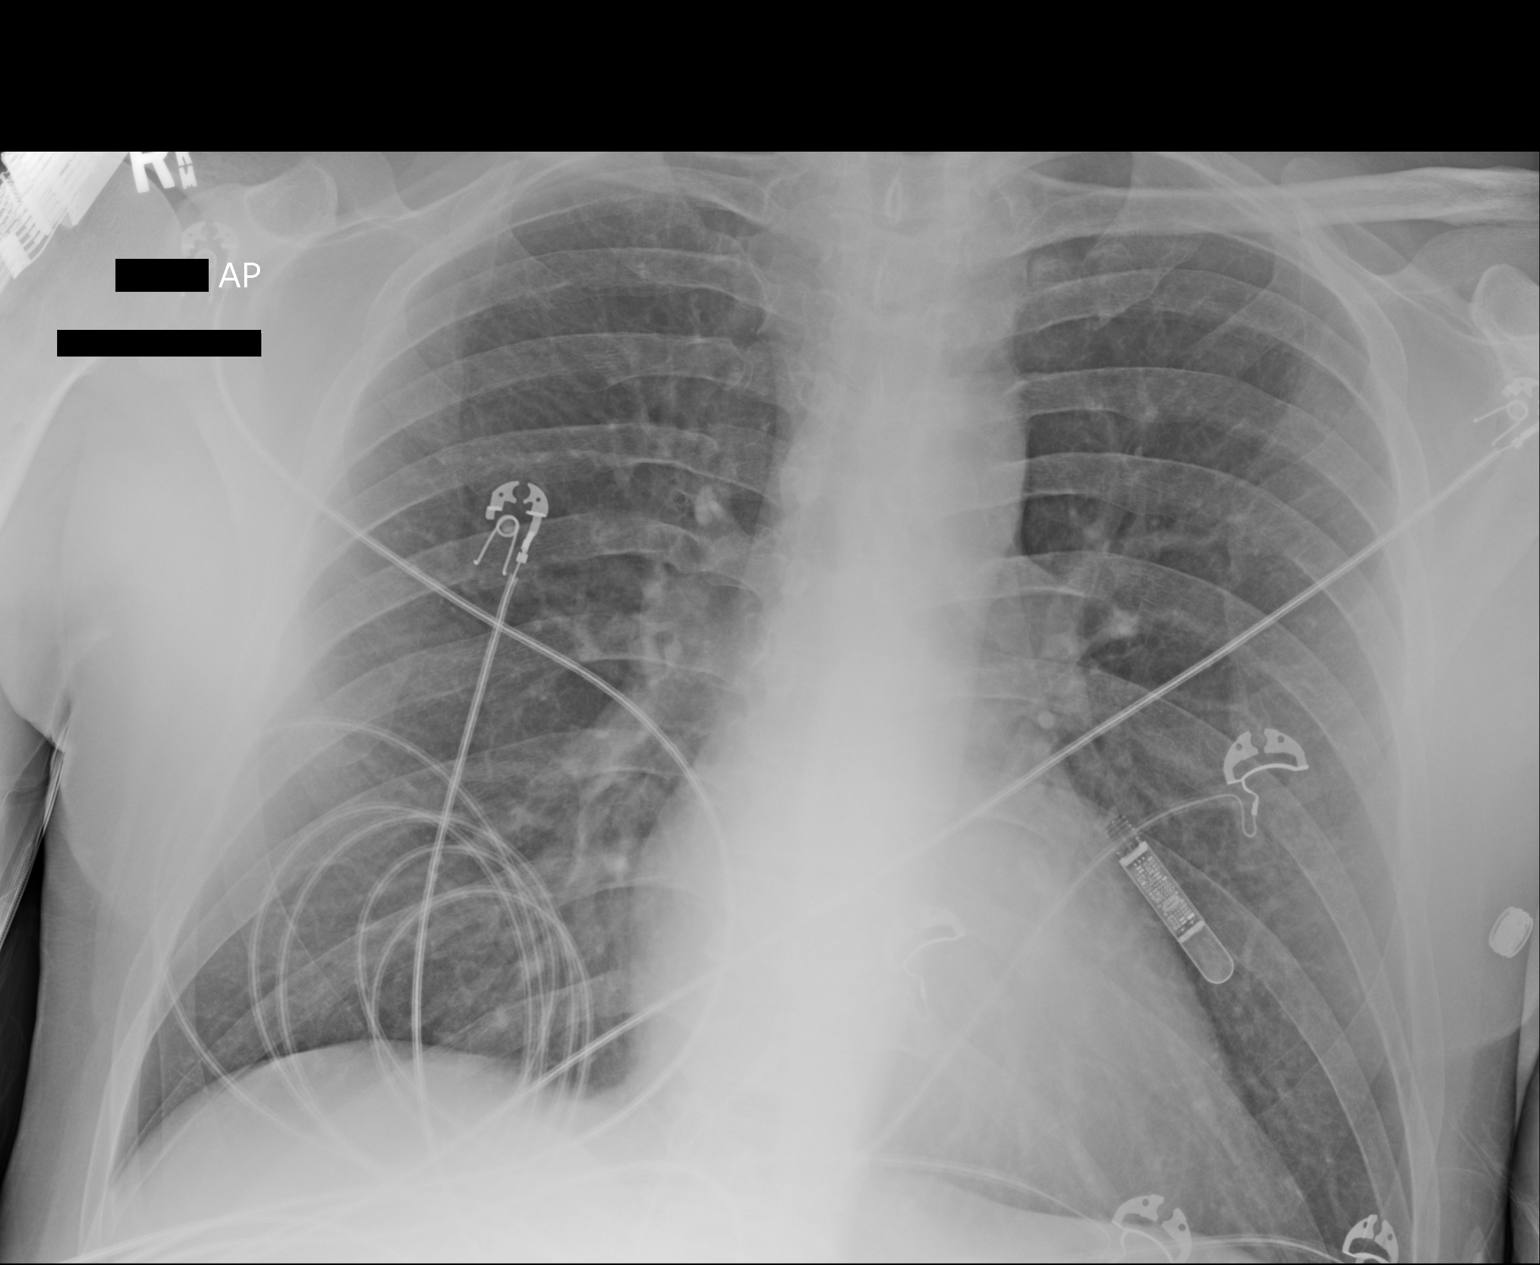

[portable (2 of 2)]
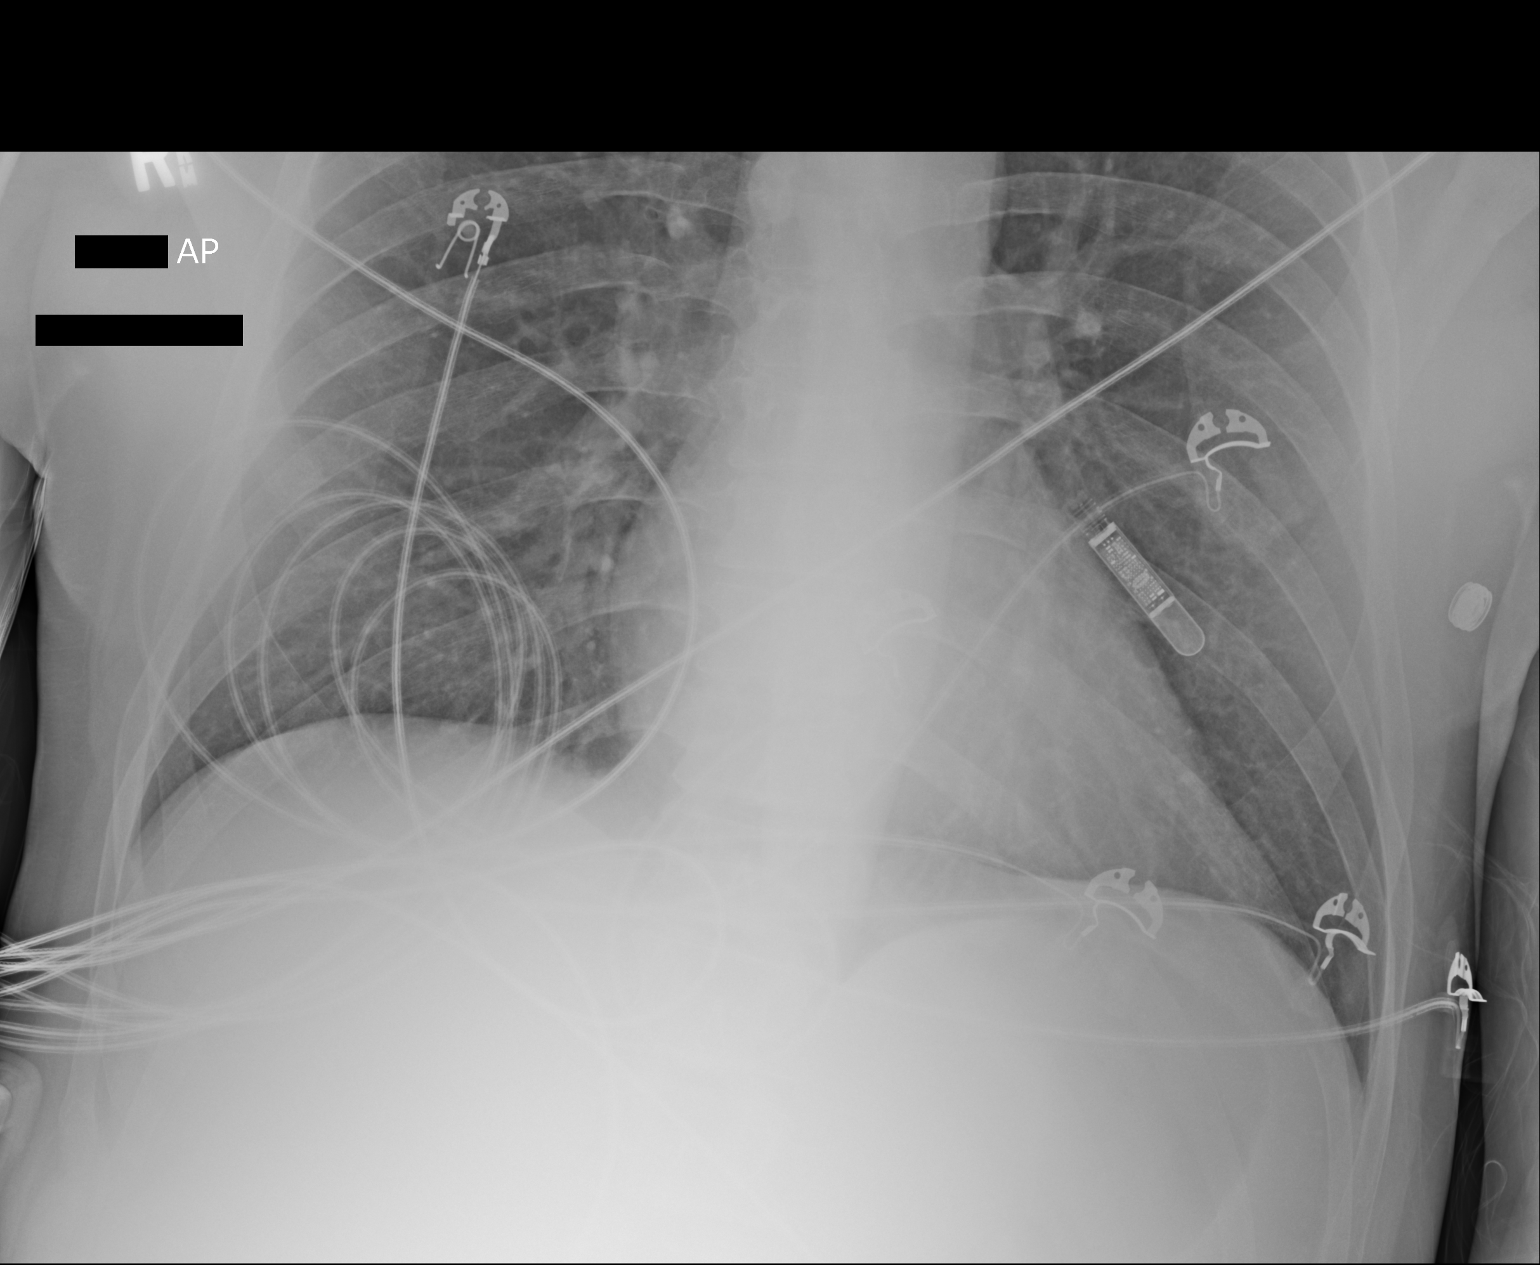

[2 of 2 positions shown; findings below may reference images not displayed]

FINDINGS: Heart size is upper normal. Negative for heart failure. Lungs are
clear without infiltrate or effusion.

Interval placement of loop recorder
IMPRESSION: No active disease.

## 2016-11-16 DIAGNOSIS — I10 Essential (primary) hypertension: Secondary | ICD-10-CM | POA: Diagnosis not present

## 2016-11-16 DIAGNOSIS — R945 Abnormal results of liver function studies: Secondary | ICD-10-CM | POA: Diagnosis not present

## 2016-11-18 DIAGNOSIS — D225 Melanocytic nevi of trunk: Secondary | ICD-10-CM | POA: Diagnosis not present

## 2016-11-18 DIAGNOSIS — L814 Other melanin hyperpigmentation: Secondary | ICD-10-CM | POA: Diagnosis not present

## 2016-11-18 DIAGNOSIS — L821 Other seborrheic keratosis: Secondary | ICD-10-CM | POA: Diagnosis not present

## 2016-11-25 ENCOUNTER — Ambulatory Visit (INDEPENDENT_AMBULATORY_CARE_PROVIDER_SITE_OTHER): Payer: 59 | Admitting: *Deleted

## 2016-11-25 DIAGNOSIS — I480 Paroxysmal atrial fibrillation: Secondary | ICD-10-CM | POA: Diagnosis not present

## 2016-11-25 NOTE — Progress Notes (Signed)
Carelink Summary Report / Loop Recorder 

## 2016-11-26 DIAGNOSIS — J040 Acute laryngitis: Secondary | ICD-10-CM | POA: Diagnosis not present

## 2016-12-06 LAB — CUP PACEART REMOTE DEVICE CHECK
Implantable Pulse Generator Implant Date: 20160309
MDC IDC SESS DTM: 20180628121200

## 2016-12-27 ENCOUNTER — Ambulatory Visit (INDEPENDENT_AMBULATORY_CARE_PROVIDER_SITE_OTHER): Payer: 59 | Admitting: *Deleted

## 2016-12-27 DIAGNOSIS — I480 Paroxysmal atrial fibrillation: Secondary | ICD-10-CM

## 2016-12-27 NOTE — Progress Notes (Signed)
Carelink Summary Report / Loop Recorder 

## 2017-01-06 LAB — CUP PACEART REMOTE DEVICE CHECK
Date Time Interrogation Session: 20180728124127
Implantable Pulse Generator Implant Date: 20160309

## 2017-01-06 NOTE — Progress Notes (Signed)
Carelink summary report received. Battery status OK. Normal device function. No new symptom episodes, tachy episodes, brady, or pause episodes. No new AF episodes. Monthly summary reports and ROV/PRN 

## 2017-01-24 ENCOUNTER — Ambulatory Visit (INDEPENDENT_AMBULATORY_CARE_PROVIDER_SITE_OTHER): Payer: 59 | Admitting: *Deleted

## 2017-01-24 DIAGNOSIS — I480 Paroxysmal atrial fibrillation: Secondary | ICD-10-CM

## 2017-01-24 NOTE — Progress Notes (Signed)
Carelink Summary Report / Loop Recorder 

## 2017-01-27 LAB — CUP PACEART REMOTE DEVICE CHECK
Implantable Pulse Generator Implant Date: 20160309
MDC IDC SESS DTM: 20180827134235

## 2017-02-12 IMAGING — CR DG CHEST 2V
2 series · 2 of 2 positions shown · non-contrast
Comparison: 08/13/2014

CLINICAL DATA: Chest pain for 1 day

EXAM:
CHEST - 2 VIEW

[chest pa]
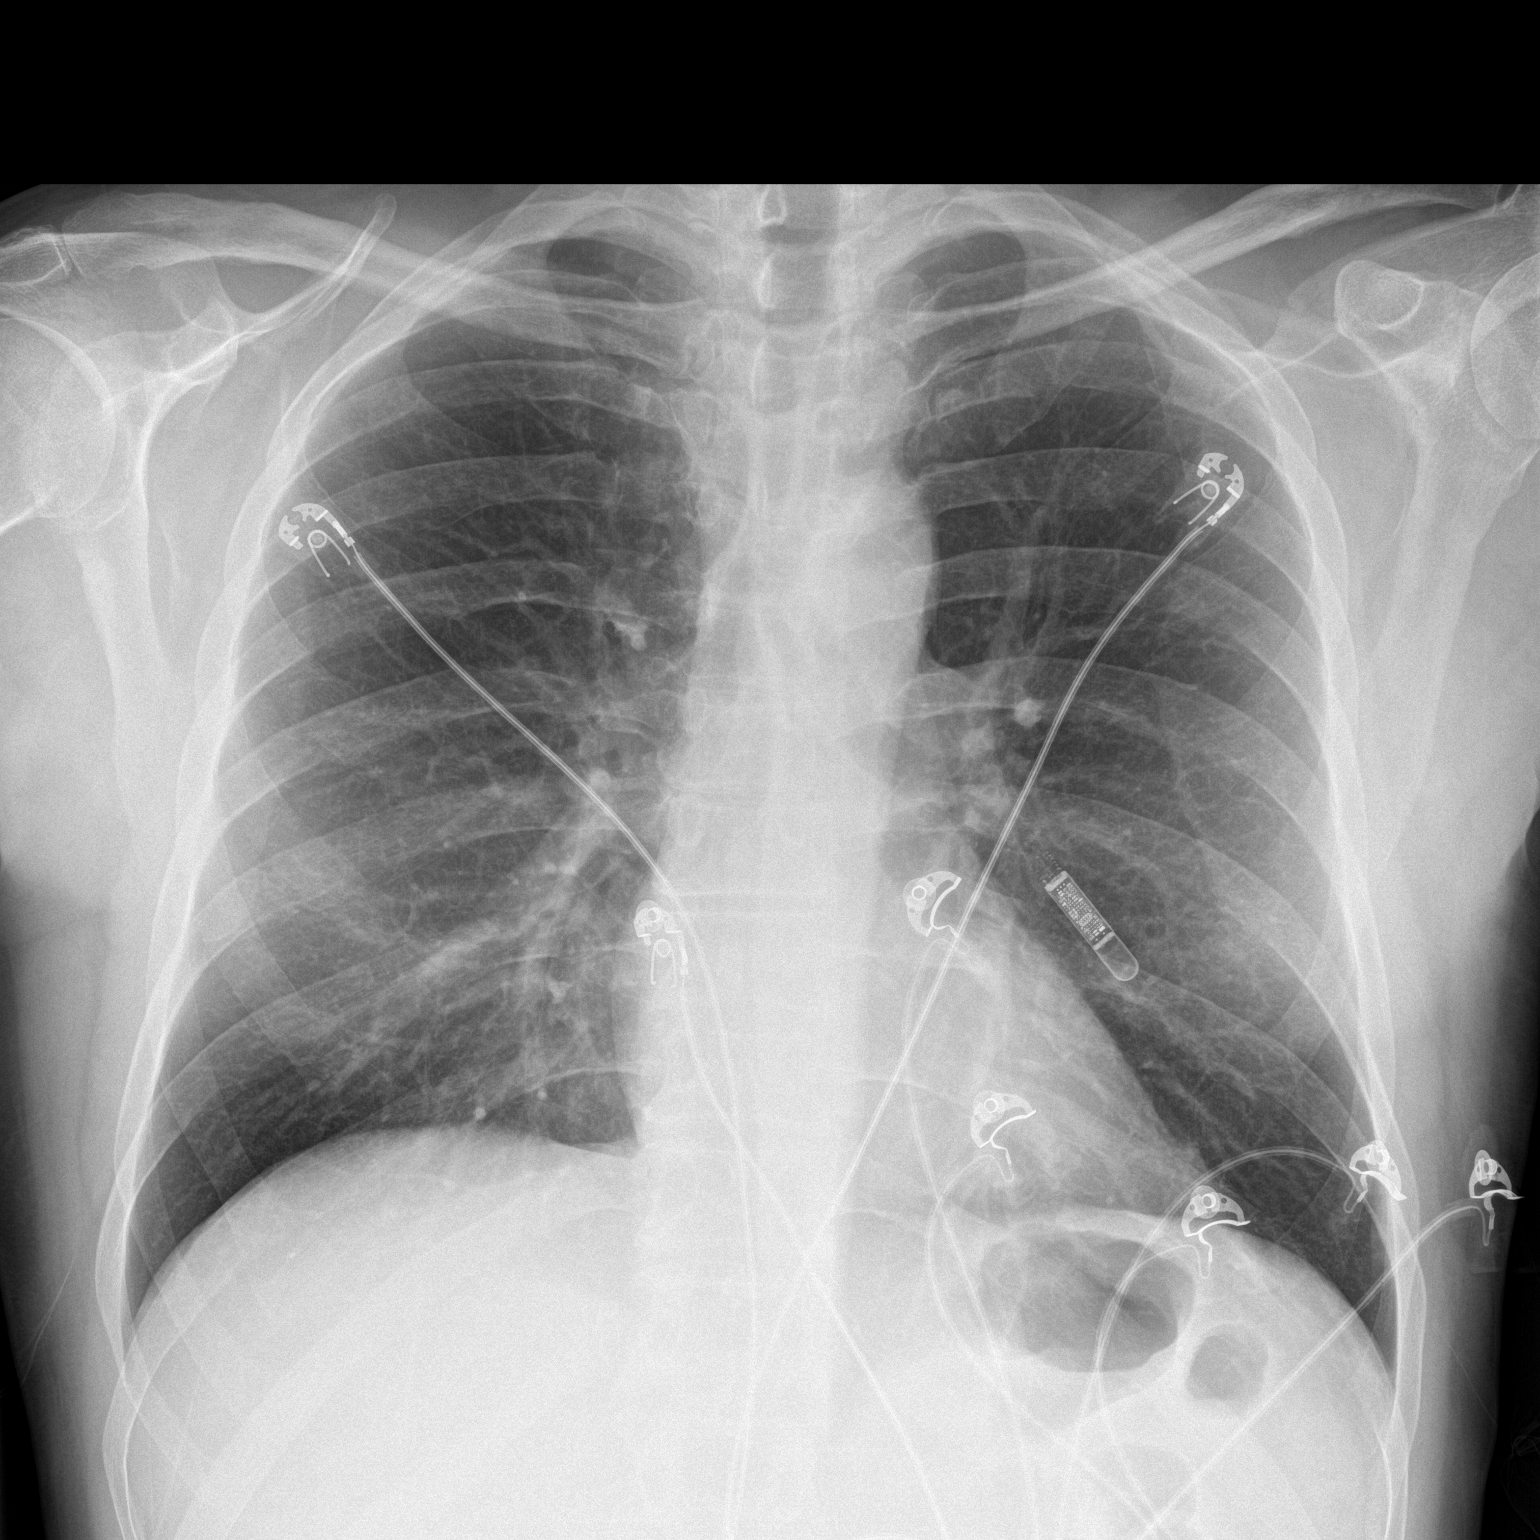

[chest lat]
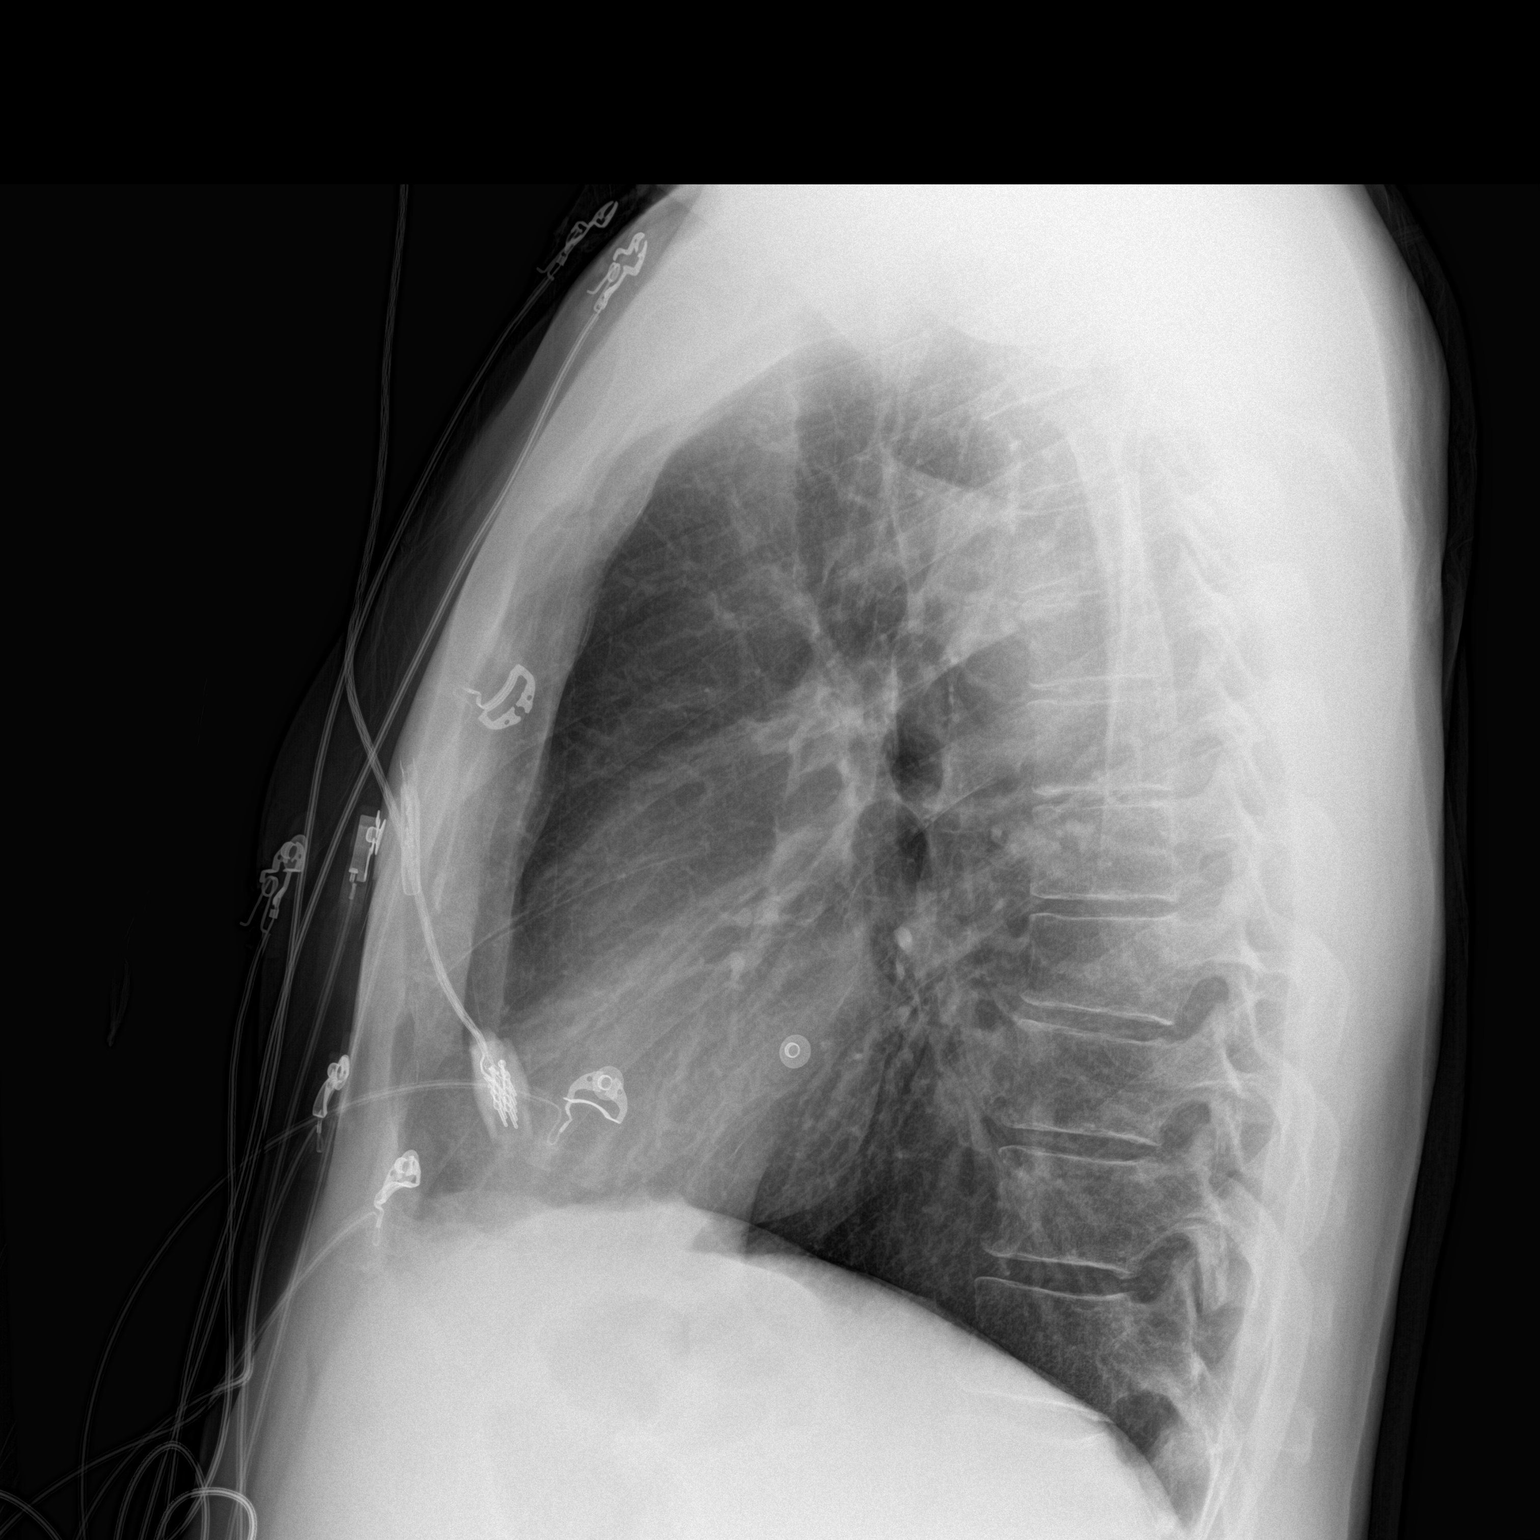

[2 of 2 positions shown; findings below may reference images not displayed]

FINDINGS: The heart size and mediastinal contours are within normal limits.
Both lungs are clear. The visualized skeletal structures are
unremarkable. A loop recorder is noted and stable.
IMPRESSION: No acute abnormality noted.

## 2017-02-23 ENCOUNTER — Ambulatory Visit (INDEPENDENT_AMBULATORY_CARE_PROVIDER_SITE_OTHER): Payer: 59 | Admitting: *Deleted

## 2017-02-23 DIAGNOSIS — I480 Paroxysmal atrial fibrillation: Secondary | ICD-10-CM

## 2017-02-23 NOTE — Progress Notes (Signed)
Carelink Summary Report / Loop Recorder 

## 2017-02-25 LAB — CUP PACEART REMOTE DEVICE CHECK
Date Time Interrogation Session: 20180926141239
MDC IDC PG IMPLANT DT: 20160309

## 2017-03-02 ENCOUNTER — Other Ambulatory Visit: Payer: Self-pay | Admitting: Cardiology

## 2017-03-25 ENCOUNTER — Ambulatory Visit (INDEPENDENT_AMBULATORY_CARE_PROVIDER_SITE_OTHER): Payer: 59 | Admitting: *Deleted

## 2017-03-25 ENCOUNTER — Encounter: Payer: Self-pay | Admitting: Internal Medicine

## 2017-03-25 ENCOUNTER — Encounter (INDEPENDENT_AMBULATORY_CARE_PROVIDER_SITE_OTHER): Payer: Self-pay

## 2017-03-25 ENCOUNTER — Ambulatory Visit (INDEPENDENT_AMBULATORY_CARE_PROVIDER_SITE_OTHER): Payer: 59 | Admitting: Internal Medicine

## 2017-03-25 VITALS — BP 140/80 | HR 89 | Ht 74.0 in | Wt 201.0 lb

## 2017-03-25 DIAGNOSIS — I10 Essential (primary) hypertension: Secondary | ICD-10-CM

## 2017-03-25 DIAGNOSIS — I480 Paroxysmal atrial fibrillation: Secondary | ICD-10-CM

## 2017-03-25 NOTE — Patient Instructions (Addendum)
Medication Instructions:  Your physician recommends that you continue on your current medications as directed. Please refer to the Current Medication list given to you today.  -- If you need a refill on your cardiac medications before your next appointment, please call your pharmacy. --  Labwork: None ordered  Testing/Procedures: None ordered  Follow-Up: Your physician wants you to follow-up in: 1 year with Dr. Rayann Heman.  You will receive a reminder letter in the mail two months in advance. If you don't receive a letter, please call our office to schedule the follow-up appointment.  Thank you for choosing [facility]!!   Frederik Schmidt, RN [phone]  Any Other Special Instructions Will Be Listed Below (If Applicable).

## 2017-03-25 NOTE — Progress Notes (Signed)
PCP: Antony Contras, MD Primary Cardiologist: Dr Meda Coffee Primary EP: Dr Derenda Fennel is a 51 y.o. male who presents today for routine electrophysiology followup.  Since last being seen in our clinic, the patient reports doing very well.  He has occasional chest wall pain above his ILR site.  Today, he denies symptoms of palpitations, exertional chest pain, shortness of breath,  lower extremity edema, dizziness, presyncope, or syncope.  The patient is otherwise without complaint today.   Past Medical History:  Diagnosis Date  . Anxiety   . Atrial fibrillation ([place])    a. PVI 04/2014 Dr Rayann Heman b. failed medical therapy with Multaq  . Atrial flutter ([place])    a. CTI ablation 04/2014 Dr Rayann Heman  . Hypertension   . Psoriasis   . S/P ablation of atrial fibrillation 12/24/14 12/25/2014   Past Surgical History:  Procedure Laterality Date  . ATRIAL FIBRILLATION ABLATION [name] 05/07/2014   PVI and CTI by Dr Rayann Heman  . CARPAL TUNNEL RELEASE    . ELECTROPHYSIOLOGIC STUDY [name] 12/24/2014   Procedure: Atrial Fibrillation Ablation;  Surgeon: Thompson Grayer, MD;  Location: [place] CV LAB;  Service: Cardiovascular;  Laterality: [name];  . HERNIA REPAIR Right    with mesh  . LOOP RECORDER IMPLANT [name] 08/07/2014   Procedure: LOOP RECORDER IMPLANT;  Surgeon: Thompson Grayer, MD;  Location: [facility] CATH LAB;  Service: Cardiovascular;  Laterality: [name];  . TEE WITHOUT CARDIOVERSION [name] 05/06/2014   Procedure: TRANSESOPHAGEAL ECHOCARDIOGRAM (TEE);  Surgeon: Pixie Casino, MD;  Location: [facility] ENDOSCOPY;  Service: Cardiovascular;  Laterality: [name];    ROS- all systems are reviewed and negatives except as per HPI above  Current Outpatient Prescriptions  Medication Sig Dispense Refill  . aspirin EC 81 MG tablet Take 1 tablet (81 mg total) by mouth daily. 90 tablet 3  . BIOTIN PO Take 1,000 mg by mouth daily.    . clobetasol ointment (TEMOVATE) 6.28 % Apply 1 application topically daily as needed (psoriasis).     .  EYELID CLEANSERS EX Apply 1 application topically 3 (three) times a week.     . Flaxseed, Linseed, (FLAXSEED OIL) 1000 MG CAPS Take 1,000 mg by mouth daily.    . fluocinonide cream (LIDEX) 3.66 % Apply 1 application topically daily as needed (psoriasis).   0  . furosemide (LASIX) 40 MG tablet Take 1 tablet (40 mg total) by mouth daily as needed (swelling). 30 tablet 3  . lisinopril (PRINIVIL,ZESTRIL) 20 MG tablet Take 1 tablet (20 mg total) by mouth daily. 90 tablet 3  . LORazepam (ATIVAN) 0.5 MG tablet Take 0.5 mg by mouth daily as needed for anxiety.    . mirtazapine (REMERON) 15 MG tablet Take 15 mg by mouth at bedtime.  0  . Multiple Vitamin (MULTIVITAMIN WITH MINERALS) TABS Take 1 tablet by mouth daily with supper.     . potassium gluconate 595 MG TABS tablet Take 595 mg by mouth daily with lunch.     . RESTASIS 0.05 % ophthalmic emulsion instill ONE DROP IN EACH EYE TWICE DAILY  3  . vitamin C (ASCORBIC ACID) 500 MG tablet Take 500 mg by mouth daily.    Marland Kitchen zinc gluconate 50 MG tablet Take 50 mg by mouth daily with supper.      No current facility-administered medications for this visit.     Physical Exam: Vitals:   03/25/17 1556  BP: 140/80  Pulse: 89  SpO2: 98%  Weight: 201 lb (91.2 kg)  Height: 6\' 2"  (1.88 m)    GEN- The patient is well appearing, alert and oriented x 3 today.   Head- normocephalic, atraumatic Eyes-  Sclera clear, conjunctiva pink Ears- hearing intact Oropharynx- clear Lungs- Clear to ausculation bilaterally, normal work of breathing Heart- Regular rate and rhythm, no murmurs, rubs or gallops, PMI not laterally displaced GI- soft, NT, ND, + BS Extremities- no clubbing, cyanosis, or edema  ILR reviewed today shows no arrhythmias  Assessment and Plan:  1. Paroxysmal atrial fibrillation No recurrence post ablation off AAD therapy chads2vasc score is 1.  Not on anticoagulation  2. HTN Stable No change required today  Return to see me in a  year [facility]  Thompson Grayer MD, [facility] 03/25/2017 4:09 PM

## 2017-03-25 NOTE — Progress Notes (Signed)
Carelink Summary Report / Loop Recorder 

## 2017-03-31 ENCOUNTER — Other Ambulatory Visit: Payer: Self-pay | Admitting: Family Medicine

## 2017-03-31 ENCOUNTER — Other Ambulatory Visit: Payer: Self-pay | Admitting: *Deleted

## 2017-03-31 ENCOUNTER — Ambulatory Visit
Admission: RE | Admit: 2017-03-31 | Discharge: 2017-03-31 | Disposition: A | Discharge status: Home / Self Care | Payer: 59 | Source: Ambulatory Visit | Attending: Family Medicine | Admitting: Family Medicine

## 2017-03-31 DIAGNOSIS — R079 Chest pain, unspecified: Secondary | ICD-10-CM

## 2017-03-31 LAB — CUP PACEART REMOTE DEVICE CHECK
Date Time Interrogation Session: 20181026144019
MDC IDC PG IMPLANT DT: 20160309

## 2017-04-07 LAB — CUP PACEART INCLINIC DEVICE CHECK
Date Time Interrogation Session: 20181026195459
MDC IDC PG IMPLANT DT: 20160309

## 2017-04-19 DIAGNOSIS — H04123 Dry eye syndrome of bilateral lacrimal glands: Secondary | ICD-10-CM | POA: Diagnosis not present

## 2017-04-19 DIAGNOSIS — H01024 Squamous blepharitis left upper eyelid: Secondary | ICD-10-CM | POA: Diagnosis not present

## 2017-04-19 DIAGNOSIS — H01021 Squamous blepharitis right upper eyelid: Secondary | ICD-10-CM | POA: Diagnosis not present

## 2017-04-25 ENCOUNTER — Ambulatory Visit (INDEPENDENT_AMBULATORY_CARE_PROVIDER_SITE_OTHER): Payer: 59 | Admitting: *Deleted

## 2017-04-25 DIAGNOSIS — I480 Paroxysmal atrial fibrillation: Secondary | ICD-10-CM | POA: Diagnosis not present

## 2017-04-26 NOTE — Progress Notes (Signed)
Carelink Summary Report / Loop Recorder 

## 2017-05-09 LAB — CUP PACEART REMOTE DEVICE CHECK
Date Time Interrogation Session: 20181125150919
MDC IDC PG IMPLANT DT: 20160309

## 2017-05-25 ENCOUNTER — Ambulatory Visit (INDEPENDENT_AMBULATORY_CARE_PROVIDER_SITE_OTHER): Payer: 59 | Admitting: *Deleted

## 2017-05-25 DIAGNOSIS — I480 Paroxysmal atrial fibrillation: Secondary | ICD-10-CM | POA: Diagnosis not present

## 2017-05-25 NOTE — Progress Notes (Signed)
Carelink Summary Report / Loop Recorder 

## 2017-06-04 LAB — CUP PACEART REMOTE DEVICE CHECK
Date Time Interrogation Session: 20181225150728
MDC IDC PG IMPLANT DT: 20160309

## 2017-06-07 DIAGNOSIS — I10 Essential (primary) hypertension: Secondary | ICD-10-CM | POA: Diagnosis not present

## 2017-06-07 DIAGNOSIS — Z23 Encounter for immunization: Secondary | ICD-10-CM | POA: Diagnosis not present

## 2017-06-07 DIAGNOSIS — Z125 Encounter for screening for malignant neoplasm of prostate: Secondary | ICD-10-CM | POA: Diagnosis not present

## 2017-06-07 DIAGNOSIS — Z0000 Encounter for general adult medical examination without abnormal findings: Secondary | ICD-10-CM | POA: Diagnosis not present

## 2017-06-23 ENCOUNTER — Ambulatory Visit (INDEPENDENT_AMBULATORY_CARE_PROVIDER_SITE_OTHER): Payer: 59 | Admitting: *Deleted

## 2017-06-23 DIAGNOSIS — I480 Paroxysmal atrial fibrillation: Secondary | ICD-10-CM

## 2017-06-23 NOTE — Progress Notes (Signed)
Carelink Summary Report / Loop Recorder 

## 2017-07-01 LAB — CUP PACEART REMOTE DEVICE CHECK
MDC IDC PG IMPLANT DT: 20160309
MDC IDC SESS DTM: 20190124154102

## 2017-07-26 ENCOUNTER — Ambulatory Visit (INDEPENDENT_AMBULATORY_CARE_PROVIDER_SITE_OTHER): Payer: 59 | Admitting: *Deleted

## 2017-07-26 DIAGNOSIS — I480 Paroxysmal atrial fibrillation: Secondary | ICD-10-CM | POA: Diagnosis not present

## 2017-07-27 NOTE — Progress Notes (Signed)
Carelink Summary Report / Loop Recorder 

## 2017-07-28 DIAGNOSIS — R05 Cough: Secondary | ICD-10-CM | POA: Diagnosis not present

## 2017-07-28 DIAGNOSIS — R21 Rash and other nonspecific skin eruption: Secondary | ICD-10-CM | POA: Diagnosis not present

## 2017-08-12 DIAGNOSIS — R21 Rash and other nonspecific skin eruption: Secondary | ICD-10-CM | POA: Diagnosis not present

## 2017-08-12 DIAGNOSIS — R05 Cough: Secondary | ICD-10-CM | POA: Diagnosis not present

## 2017-08-15 DIAGNOSIS — S76112A Strain of left quadriceps muscle, fascia and tendon, initial encounter: Secondary | ICD-10-CM | POA: Diagnosis not present

## 2017-08-15 DIAGNOSIS — S76111A Strain of right quadriceps muscle, fascia and tendon, initial encounter: Secondary | ICD-10-CM | POA: Diagnosis not present

## 2017-08-25 ENCOUNTER — Other Ambulatory Visit: Payer: Self-pay | Admitting: Cardiology

## 2017-08-25 ENCOUNTER — Other Ambulatory Visit: Payer: Self-pay | Admitting: Internal Medicine

## 2017-08-25 DIAGNOSIS — I480 Paroxysmal atrial fibrillation: Secondary | ICD-10-CM

## 2017-08-25 DIAGNOSIS — I10 Essential (primary) hypertension: Secondary | ICD-10-CM

## 2017-08-25 MED ORDER — LISINOPRIL 20 MG PO TABS: 20.0000 mg | ORAL_TABLET | Freq: Every day | ORAL | 1 refills | Status: DC

## 2017-08-29 ENCOUNTER — Ambulatory Visit (INDEPENDENT_AMBULATORY_CARE_PROVIDER_SITE_OTHER): Payer: 59 | Admitting: *Deleted

## 2017-08-29 DIAGNOSIS — I480 Paroxysmal atrial fibrillation: Secondary | ICD-10-CM | POA: Diagnosis not present

## 2017-08-29 NOTE — Progress Notes (Signed)
Carelink Summary Report / Loop Recorder 

## 2017-08-30 LAB — CUP PACEART REMOTE DEVICE CHECK
Date Time Interrogation Session: 20190226174322
Implantable Pulse Generator Implant Date: 20160309

## 2017-09-02 ENCOUNTER — Telehealth: Payer: Self-pay | Admitting: Cardiology

## 2017-09-02 DIAGNOSIS — L03115 Cellulitis of right lower limb: Secondary | ICD-10-CM | POA: Diagnosis not present

## 2017-09-02 DIAGNOSIS — L03116 Cellulitis of left lower limb: Secondary | ICD-10-CM | POA: Diagnosis not present

## 2017-09-02 NOTE — Telephone Encounter (Signed)
Pt calling to ask Dr Meda Coffee if he could have his lasix scheduled or increased on a daily basis for noted lower extremity edema. Pt states he has bilateral lower extremity edema for the past week.  Pt states that he has no sob, doe, cp, exertional cp, or any other cardiac complaints.  Pt states he is on his feet at work daily. Pt states he feels relieved when he takes his lasix on a daily basis.  Pt states his swelling was worse today, so he took his lasix 40 mg PRN dose, and noted only a slight change, with swelling still present.  Pt wants to know if he can take an extra dose.  Advised the pt to maintain a low sodium diet, and elevate his extremities at rest.  Informed the pt that I will route this message to Dr Meda Coffee to review and advise on, and I will follow-up with the pt shortly thereafter. Pt verbalized understanding and agrees with this plan.

## 2017-09-02 NOTE — Telephone Encounter (Signed)
Called the pt to endorse recommendations per Dr Meda Coffee, and he then began to mention that he has redness to his left lower ankle and this feels warm-to-touch. Pt reports that it "looks like a rash." Endorsed this to Dr Meda Coffee and she advised that the pt report to Urgent Care now for possible cellulitis.  Pt verbalized understanding and agrees with this plan.

## 2017-09-02 NOTE — Telephone Encounter (Signed)
He can take Lasix 40 daily as needed for lower extremity edema or weight increase of 3 pounds.

## 2017-09-02 NOTE — Telephone Encounter (Signed)
New Message   Pt c/o medication issue:  1. Name of Medication: furosemide (LASIX) 40 MG tablet  2. How are you currently taking this medication (dosage and times per day)?  1 tablet every day as needed   3. Are you having a reaction (difficulty breathing--STAT)?    4. What is your medication issue? Patient states that he had some swelling in his leg this morning and he took the furosemide. He wants to know can he take another one. Please call

## 2017-09-29 ENCOUNTER — Other Ambulatory Visit: Payer: Self-pay

## 2017-09-29 ENCOUNTER — Encounter (HOSPITAL_BASED_OUTPATIENT_CLINIC_OR_DEPARTMENT_OTHER): Payer: Self-pay | Admitting: *Deleted

## 2017-09-29 ENCOUNTER — Emergency Department (HOSPITAL_BASED_OUTPATIENT_CLINIC_OR_DEPARTMENT_OTHER)
Admission: EM | Admit: 2017-09-29 | Discharge: 2017-09-30 | Disposition: A | Discharge status: Home / Self Care | Payer: 59 | Attending: Emergency Medicine | Admitting: Emergency Medicine

## 2017-09-29 ENCOUNTER — Emergency Department (HOSPITAL_BASED_OUTPATIENT_CLINIC_OR_DEPARTMENT_OTHER): Payer: 59

## 2017-09-29 DIAGNOSIS — S8992XA Unspecified injury of left lower leg, initial encounter: Secondary | ICD-10-CM | POA: Diagnosis not present

## 2017-09-29 DIAGNOSIS — Z87891 Personal history of nicotine dependence: Secondary | ICD-10-CM | POA: Insufficient documentation

## 2017-09-29 DIAGNOSIS — R2242 Localized swelling, mass and lump, left lower limb: Secondary | ICD-10-CM | POA: Insufficient documentation

## 2017-09-29 DIAGNOSIS — I5032 Chronic diastolic (congestive) heart failure: Secondary | ICD-10-CM | POA: Insufficient documentation

## 2017-09-29 DIAGNOSIS — M79605 Pain in left leg: Secondary | ICD-10-CM

## 2017-09-29 DIAGNOSIS — M7989 Other specified soft tissue disorders: Secondary | ICD-10-CM

## 2017-09-29 DIAGNOSIS — M79662 Pain in left lower leg: Secondary | ICD-10-CM | POA: Diagnosis not present

## 2017-09-29 DIAGNOSIS — Z79899 Other long term (current) drug therapy: Secondary | ICD-10-CM | POA: Insufficient documentation

## 2017-09-29 DIAGNOSIS — I110 Hypertensive heart disease with heart failure: Secondary | ICD-10-CM | POA: Insufficient documentation

## 2017-09-29 DIAGNOSIS — R451 Restlessness and agitation: Secondary | ICD-10-CM | POA: Diagnosis not present

## 2017-09-29 DIAGNOSIS — Z7982 Long term (current) use of aspirin: Secondary | ICD-10-CM | POA: Diagnosis not present

## 2017-09-29 LAB — TROPONIN I

## 2017-09-29 LAB — BRAIN NATRIURETIC PEPTIDE: B NATRIURETIC PEPTIDE 5: 14.3 pg/mL (ref 0.0–100.0)

## 2017-09-29 LAB — COMPREHENSIVE METABOLIC PANEL
ALBUMIN: 4.1 g/dL (ref 3.5–5.0)
ALT: 32 U/L (ref 17–63)
AST: 51 U/L — AB (ref 15–41)
Alkaline Phosphatase: 50 U/L (ref 38–126)
Anion gap: 15 (ref 5–15)
BUN: 19 mg/dL (ref 6–20)
CHLORIDE: 99 mmol/L — AB (ref 101–111)
CO2: 22 mmol/L (ref 22–32)
CREATININE: 0.96 mg/dL (ref 0.61–1.24)
Calcium: 8.9 mg/dL (ref 8.9–10.3)
GFR calc Af Amer: >60 mL/min (ref >60)
GFR calc non Af Amer: >60 mL/min (ref >60)
GLUCOSE: 97 mg/dL (ref 65–99)
POTASSIUM: 4.2 mmol/L (ref 3.5–5.1)
Sodium: 136 mmol/L (ref 135–145)
Total Bilirubin: 1.2 mg/dL (ref 0.3–1.2)
Total Protein: 7 g/dL (ref 6.5–8.1)

## 2017-09-29 LAB — CBC WITH DIFFERENTIAL/PLATELET
BASOS ABS: 0 10*3/uL (ref 0.0–0.1)
BASOS PCT: 1 %
EOS PCT: 2 %
Eosinophils Absolute: 0.2 10*3/uL (ref 0.0–0.7)
HEMATOCRIT: 37.7 % — AB (ref 39.0–52.0)
Hemoglobin: 13.4 g/dL (ref 13.0–17.0)
LYMPHS PCT: 14 %
Lymphs Abs: 1 10*3/uL (ref 0.7–4.0)
MCH: 36.7 pg — ABNORMAL HIGH (ref 26.0–34.0)
MCHC: 35.5 g/dL (ref 30.0–36.0)
MCV: 103.3 fL — ABNORMAL HIGH (ref 78.0–100.0)
MONO ABS: 0.6 10*3/uL (ref 0.1–1.0)
Monocytes Relative: 9 %
NEUTROS ABS: 4.9 10*3/uL (ref 1.7–7.7)
Neutrophils Relative %: 74 %
PLATELETS: 220 10*3/uL (ref 150–400)
RBC: 3.65 MIL/uL — AB (ref 4.22–5.81)
RDW: 12 % (ref 11.5–15.5)
WBC: 6.6 10*3/uL (ref 4.0–10.5)

## 2017-09-29 LAB — PROTIME-INR
INR: 1.02
Prothrombin Time: 13.3 seconds (ref 11.4–15.2)

## 2017-09-29 LAB — MAGNESIUM: MAGNESIUM: 1.8 mg/dL (ref 1.7–2.4)

## 2017-09-29 LAB — AMMONIA: Ammonia: 22 umol/L (ref 9–35)

## 2017-09-29 LAB — ETHANOL

## 2017-09-29 LAB — I-STAT CG4 LACTIC ACID, ED: LACTIC ACID, VENOUS: 1.77 mmol/L (ref 0.5–1.9)

## 2017-09-29 NOTE — ED Triage Notes (Signed)
Swelling to his left lower leg for over a month. Hx of a fall 6 weeks ago with left knee injury. Bruising to his knee. States he bruises easily.

## 2017-09-29 NOTE — ED Provider Notes (Signed)
[place] EMERGENCY DEPARTMENT Provider Note   CSN: [id number] Arrival date & time: 09/29/17  4431     History   Chief Complaint Chief Complaint  Patient presents with  . Leg Pain    HPI Evan [name] is a 52 y.o. male.  The history is provided by the patient and medical records.  Leg Pain   This is a new problem. The current episode started more than 1 week ago. The problem occurs constantly. The problem has not changed since onset.The pain is present in the left lower leg. The quality of the pain is described as aching. The pain is mild. Pertinent negatives include no numbness and no stiffness. He has tried nothing for the symptoms. The treatment provided no relief. There has been a history of trauma.    Past Medical History:  Diagnosis Date  . Anxiety   . Atrial fibrillation ([place])    a. PVI 04/2014 Dr Rayann Heman b. failed medical therapy with Multaq  . Atrial flutter ([place])    a. CTI ablation 04/2014 Dr Rayann Heman  . Hypertension   . Psoriasis   . S/P ablation of atrial fibrillation 12/24/14 12/25/2014    Patient Active Problem List   Diagnosis Date Noted  . Hypokalemia 05/28/2015  . Chronic diastolic CHF (congestive heart failure), NYHA class 2 ([place]) 05/28/2015  . PAF (paroxysmal atrial fibrillation) ([place]) 05/28/2015  . Diastolic CHF, acute on chronic (HCC) 02/19/2015  . Excessive daytime sleepiness 01/23/2015  . S/P ablation of atrial fibrillation 12/24/14 12/25/2014  . A-fib ([place]) 12/24/2014  . Hyperlipidemia 10/10/2014  . Paroxysmal atrial fibrillation ([place]) 05/07/2014  . Atrial fibrillation ([place])   . Anticoagulation adequate 02/21/2014  . Abnormal stress test 02/21/2014  . Atrial fibrillation with RVR ([place]) 01/25/2014  . Alcohol abuse 01/25/2014  . DOE (dyspnea on exertion) 01/25/2014  . Essential hypertension 01/25/2014    Past Surgical History:  Procedure Laterality Date  . ATRIAL FIBRILLATION ABLATION [name] 05/07/2014   PVI and CTI by Dr Rayann Heman  .  CARPAL TUNNEL RELEASE    . ELECTROPHYSIOLOGIC STUDY [name] 12/24/2014   Procedure: Atrial Fibrillation Ablation;  Surgeon: Thompson Grayer, MD;  Location: [place] CV LAB;  Service: Cardiovascular;  Laterality: [name];  . HERNIA REPAIR Right    with mesh  . LOOP RECORDER IMPLANT [name] 08/07/2014   Procedure: LOOP RECORDER IMPLANT;  Surgeon: Thompson Grayer, MD;  Location[facility] CATH LAB;  Service: Cardiovascular;  Laterality: [name];  . TEE WITHOUT CARDIOVERSION [name] 05/06/2014   Procedure: TRANSESOPHAGEAL ECHOCARDIOGRAM (TEE);  Surgeon: Pixie Casino, MD;  Location: [facility] ENDOSCOPY;  Service: Cardiovascular;  Laterality: [name];        Home Medications    Prior to Admission medications   Medication Sig Start Date End Date Taking? Authorizing Provider  amLODipine-benazepril (LOTREL) 10-40 MG capsule Take 1 capsule by mouth daily.   Yes [provider]  lisinopril (PRINIVIL,ZESTRIL) 20 MG tablet Take 1 tablet (20 mg total) by mouth daily. 08/25/17  Yes Allred, Jeneen Rinks, MD  olmesartan (BENICAR) 20 MG tablet Take 20 mg by mouth daily.   Yes [provider]  aspirin EC 81 MG tablet Take 1 tablet (81 mg total) by mouth daily. 05/28/15   Dorothy Spark, MD  BIOTIN PO Take 1,000 mg by mouth daily.    [provider]  clobetasol ointment (TEMOVATE) 5.40 % Apply 1 application topically daily as needed (psoriasis).     [provider]  EYELID CLEANSERS EX Apply 1 application topically  3 (three) times a week.     [provider]  Flaxseed, Linseed, (FLAXSEED OIL) 1000 MG CAPS Take 1,000 mg by mouth daily.    [provider]  fluocinonide cream (LIDEX) 0.09 % Apply 1 application topically daily as needed (psoriasis).  10/21/14   [provider]  furosemide (LASIX) 40 MG tablet TAKE ONE TABLET EVERY DAY AS NEEDED FOR SWELLING 08/25/17   Dorothy Spark, MD  LORazepam (ATIVAN) 0.5 MG tablet Take 0.5 mg by mouth daily as needed for anxiety.    [provider]  mirtazapine (REMERON) 15 MG tablet Take 15 mg by mouth at bedtime. 01/20/16   [provider]  Multiple Vitamin (MULTIVITAMIN WITH MINERALS) TABS Take 1 tablet by mouth daily with supper.     [provider]  potassium gluconate 595 MG TABS tablet Take 595 mg by mouth daily with lunch.     [provider]  RESTASIS 0.05 % ophthalmic emulsion instill ONE DROP IN [facility] EYE TWICE DAILY 11/10/15   [provider]  vitamin C (ASCORBIC ACID) 500 MG tablet Take 500 mg by mouth daily.    [provider]  zinc gluconate 50 MG tablet Take 50 mg by mouth daily with supper.     [provider]    Family History Family History  Problem Relation Age of Onset  . Hypertension Mother   . Heart failure Mother   . Diabetes Mother   . Heart attack Brother     Social History Social History   Tobacco Use  . Smoking status: Former Smoker    Types: Cigarettes, Cigars  . Smokeless tobacco: Never Used  Substance Use Topics  . Alcohol use: Yes    Comment: wine at night to help relax  . Drug use: No     Allergies   Adhesive [tape] and Eliquis [apixaban]   Review of Systems Review of Systems  Constitutional: Negative for chills, diaphoresis, fatigue and fever.  HENT: Negative for congestion and rhinorrhea.   Respiratory: Negative for cough, chest tightness, shortness of breath and stridor.   Cardiovascular: Positive for leg swelling. Negative for chest pain and palpitations.  Gastrointestinal: Negative for abdominal pain, constipation, diarrhea, nausea and vomiting.  Genitourinary: Negative for dysuria and flank pain.  Musculoskeletal: Negative for back pain, gait problem, neck pain, neck stiffness and stiffness.  Skin: Positive for color change. Negative for rash and wound.  Neurological: Negative for light-headedness, numbness and headaches.  Psychiatric/Behavioral: Positive for agitation.  All other systems reviewed and are  negative.    Physical Exam Updated Vital Signs BP (!) 147/97 (BP Location: Left Arm)   Pulse 97   Temp 98.2 F (36.8 C) (Oral)   Resp 20   Ht 6\' 2"  (1.88 m)   Wt 90.7 kg (200 lb)   SpO2 100%   BMI 25.68 kg/m   Physical Exam  Constitutional: He is oriented to person, place, and time. He appears well-developed and well-nourished. No distress.  HENT:  Head: Normocephalic and atraumatic.  Mouth/Throat: Oropharynx is clear and moist. No oropharyngeal exudate.  Eyes: Pupils are equal, round, and reactive to light. Conjunctivae and EOM are normal.  Neck: Neck supple.  Cardiovascular: Normal rate and regular rhythm.  No murmur heard. Pulmonary/Chest: Effort normal and breath sounds normal. No respiratory distress. He has no wheezes. He has no rales. He exhibits no tenderness.  Abdominal: Soft. There is no tenderness.  Musculoskeletal: He exhibits edema and tenderness.  Left knee: He exhibits ecchymosis. Tenderness found.       Legs: Neurological: He is alert and oriented to person, place, and time. He displays normal reflexes. No sensory deficit. He exhibits normal muscle tone.  Skin: Skin is warm and dry. Capillary refill takes less than 2 seconds. He is not diaphoretic. No erythema. No pallor.  Psychiatric: He has a normal mood and affect.  Nursing note and vitals reviewed.    ED Treatments / Results  Labs (all labs ordered are listed, but only abnormal results are displayed) Labs Reviewed  CBC WITH DIFFERENTIAL/PLATELET - Abnormal; Notable for the following components:      Result Value   RBC 3.65 (*)    HCT 37.7 (*)    MCV 103.3 (*)    MCH 36.7 (*)    All other components within normal limits  COMPREHENSIVE METABOLIC PANEL - Abnormal; Notable for the following components:   Chloride 99 (*)    AST 51 (*)    All other components within normal limits  ETHANOL  AMMONIA  PROTIME-INR  TROPONIN I  BRAIN NATRIURETIC PEPTIDE  MAGNESIUM  I-STAT CG4 LACTIC ACID, ED    I-STAT CG4 LACTIC ACID, ED    EKG EKG Interpretation  Date/Time:  Thursday Sep 29 2017 21:34:24 EDT Ventricular Rate:  86 PR Interval:    QRS Duration: 96 QT Interval:  359 QTC Calculation: 430 R Axis:   60 Text Interpretation:  Sinus rhythm When compared to prior, no significant changes seen.  No STEMI Confirmed by Antony Blackbird [phone]) on 09/29/2017 10:37:06 PM   Radiology US Venous Img Lower Unilateral Left  Result Date: 09/29/2017 CLINICAL DATA:  Left lower extremity swelling x1 month, fall several weeks ago EXAM: LEFT LOWER EXTREMITY VENOUS DOPPLER ULTRASOUND TECHNIQUE: Gray-scale sonography with graded compression, as well as color Doppler and duplex ultrasound were performed to evaluate the lower extremity deep venous systems from the level of the common femoral vein and including the common femoral, femoral, profunda femoral, popliteal and calf veins including the posterior tibial, peroneal and gastrocnemius veins when visible. The superficial great saphenous vein was also interrogated. Spectral Doppler was utilized to evaluate flow at rest and with distal augmentation maneuvers in the common femoral, femoral and popliteal veins. COMPARISON:  None. FINDINGS: Contralateral Common Femoral Vein: Respiratory phasicity is normal and symmetric with the symptomatic side. No evidence of thrombus. Normal compressibility. Common Femoral Vein: No evidence of thrombus. Normal compressibility, respiratory phasicity and response to augmentation. Saphenofemoral Junction: No evidence of thrombus. Normal compressibility and flow on color Doppler imaging. Profunda Femoral Vein: No evidence of thrombus. Normal compressibility and flow on color Doppler imaging. Femoral Vein: No evidence of thrombus. Normal compressibility, respiratory phasicity and response to augmentation. Popliteal Vein: No evidence of thrombus. Normal compressibility, respiratory phasicity and response to augmentation. Calf Veins: No  evidence of thrombus. Normal compressibility and flow on color Doppler imaging. Superficial Great Saphenous Vein: No evidence of thrombus. Normal compressibility. Venous Reflux:  None. Other Findings:  None. IMPRESSION: No evidence of deep venous thrombosis. Electronically Signed   By: Julian Hy M.D.   On: 09/29/2017 23:01   Dg Knee Complete 4 Views Left  Result Date: 09/29/2017 CLINICAL DATA:  Left lower extremity swelling for 1 month EXAM: LEFT KNEE - COMPLETE 4+ VIEW COMPARISON:  None. FINDINGS: No evidence of fracture, dislocation, or joint effusion. No evidence of arthropathy or other focal bone abnormality. Soft tissues are unremarkable. IMPRESSION: Negative. Electronically Signed   By: Maudie Mercury  Francoise Ceo M.D.   On: 09/29/2017 22:11    Procedures Procedures (including critical care time)  Medications Ordered in ED Medications - No data to display   Initial Impression / Assessment and Plan / ED Course  I have reviewed the triage vital signs and the nursing notes.  Pertinent labs & imaging results that were available during my care of the patient were reviewed by me and considered in my medical decision making (see chart for details).     Evan [name] is a 52 y.o. male with a past medical history significant for atrial fibrillation status post ablation, anxiety, hypertension, and reported alcohol abuse who presents with left leg pain and swelling.  Patient reports that several weeks ago he had a fall and hurt his left knee.  He reports that he has had swelling in the left lower leg for the last week and a half.  There is also been more bruising in his knee.  Patient reports he has had reassuring x-ray several weeks ago when the fall occurred but due to the continued pain he want to be reevaluated.  Patient denies any history of DVT or PE.  He reports that he occasionally has leg swelling after standing on his feet at work for prolonged periods of time.  He denies any shortness of  breath or chest pain.  He denies any nausea, vomiting, constipation, diarrhea, dysuria.  He denies any other complaints aside from the leg pain and swelling.  He reports he does mild to moderate discomfort.  He does take Lasix for fluid overload.  On exam, patient has bruising on the left knee.  Patient has some pain with palpation of the knee but there is full range of motion without significant pain elicited.  Normal sensation and strength in lower extremities and normal pulse.  Patient has some edema to left lower leg.  Given the unilateral pain and swelling, ultrasound was obtained showing no evidence of DVT.  X-rays obtained of the knee show no evidence of fracture.  Due to the bruising and edema, screening laboratory testing was performed which was overall reassuring.  Normal BNP.  Patient was felt to be stable for discharge home after reassuring work-up.  Patient instructed to follow-up with PCP.  Patient given a knee sleeve and instructed to use compression stockings at home.  Patient also given advice on outpatient alcohol resources.  Patient voiced understanding of return precautions and plan of care.  Patient discharged in good condition.    Final Clinical Impressions(s) / ED Diagnoses   Final diagnoses:  Left leg pain  Left leg swelling    ED Discharge Orders    None      Clinical Impression: 1. Left leg pain   2. Left leg swelling     Disposition: Discharge  Condition: Good  I have discussed the results, Dx and Tx plan with the pt(& family if present). He/she/they expressed understanding and agree(s) with the plan. Discharge instructions discussed at great length. Strict return precautions discussed and pt &/or family have verbalized understanding of the instructions. No further questions at time of discharge.    New Prescriptions   No medications on file    Follow Up: Antony Contras, MD [address] [place]  [address] [place] [address] [id number] QM GQQP [place] [phone]       Tegeler, Gwenyth Allegra, MD 09/30/17 [phone]

## 2017-09-29 NOTE — ED Notes (Signed)
Pt reports taking lasix PRN-but has increased to every day this past month. No improvement.

## 2017-09-30 ENCOUNTER — Ambulatory Visit (INDEPENDENT_AMBULATORY_CARE_PROVIDER_SITE_OTHER): Payer: 59 | Admitting: *Deleted

## 2017-09-30 DIAGNOSIS — I480 Paroxysmal atrial fibrillation: Secondary | ICD-10-CM

## 2017-09-30 NOTE — Discharge Instructions (Signed)
Your work-up today showed no evidence of fracture on imaging and there is no evidence of blood clot on ultrasound.  Your labs are overall reassuring.  Given your reassuring work-up we feel you are safe for discharge home.  Please use the compression stockings we recommended and use the knee sleeve for the knee pain.  Please use over-the-counter anti-inflammatory medications as well as rest, ice, compression, and elevation.  If any symptoms change or worsen, please return to the nearest emergency department and please follow-up with a primary care physician.  Please utilize the resources to help with your alcohol intake which we also discussed.

## 2017-10-03 LAB — CUP PACEART REMOTE DEVICE CHECK
Date Time Interrogation Session: 20190331181102
MDC IDC PG IMPLANT DT: 20160309

## 2017-10-03 NOTE — Progress Notes (Signed)
Carelink Summary Report / Loop Recorder 

## 2017-10-06 DIAGNOSIS — Z23 Encounter for immunization: Secondary | ICD-10-CM | POA: Diagnosis not present

## 2017-10-06 DIAGNOSIS — I10 Essential (primary) hypertension: Secondary | ICD-10-CM | POA: Diagnosis not present

## 2017-10-06 DIAGNOSIS — M7989 Other specified soft tissue disorders: Secondary | ICD-10-CM | POA: Diagnosis not present

## 2017-10-06 DIAGNOSIS — M25562 Pain in left knee: Secondary | ICD-10-CM | POA: Diagnosis not present

## 2017-10-17 DIAGNOSIS — H524 Presbyopia: Secondary | ICD-10-CM | POA: Diagnosis not present

## 2017-10-17 DIAGNOSIS — H52223 Regular astigmatism, bilateral: Secondary | ICD-10-CM | POA: Diagnosis not present

## 2017-10-17 DIAGNOSIS — H5213 Myopia, bilateral: Secondary | ICD-10-CM | POA: Diagnosis not present

## 2017-10-25 LAB — CUP PACEART REMOTE DEVICE CHECK
Date Time Interrogation Session: 20190503183703
MDC IDC PG IMPLANT DT: 20160309

## 2017-11-01 DIAGNOSIS — H00023 Hordeolum internum right eye, unspecified eyelid: Secondary | ICD-10-CM | POA: Diagnosis not present

## 2017-11-02 ENCOUNTER — Ambulatory Visit (INDEPENDENT_AMBULATORY_CARE_PROVIDER_SITE_OTHER): Payer: 59 | Admitting: *Deleted

## 2017-11-02 DIAGNOSIS — I480 Paroxysmal atrial fibrillation: Secondary | ICD-10-CM

## 2017-11-03 NOTE — Progress Notes (Signed)
Carelink Summary Report / Loop Recorder 

## 2017-11-07 ENCOUNTER — Telehealth: Payer: Self-pay | Admitting: *Deleted

## 2017-11-07 NOTE — Telephone Encounter (Signed)
LVMOM regarding LINQ at [facility] since 11/04/17. Gave phone number to call back to discuss next step.

## 2017-11-08 ENCOUNTER — Telehealth: Payer: Self-pay

## 2017-11-08 NOTE — Telephone Encounter (Signed)
See telephone note 11/07/17.

## 2017-11-08 NOTE — Telephone Encounter (Signed)
Pt called regarding a phone message from 11-07-2017. I told him I will have the nurse call him back.

## 2017-11-08 NOTE — Telephone Encounter (Signed)
Spoke with patient regarding LINQ at [facility]. Discussed the options of LINQ explant vs. Leaving in. Patient requested to wait to discuss explant at annual follow-up. Follow up scheduled 03/08/18 at 3:00pm. Advised patient return kit to be sent to his house for the [facility] monitor.  Address confirmed with patient. Patient verbalized understanding.   Message sent to Dairl Ponder to schedule annual appointment with Dr. Meda Coffee.

## 2017-11-30 DIAGNOSIS — D1801 Hemangioma of skin and subcutaneous tissue: Secondary | ICD-10-CM | POA: Diagnosis not present

## 2017-11-30 DIAGNOSIS — L821 Other seborrheic keratosis: Secondary | ICD-10-CM | POA: Diagnosis not present

## 2017-11-30 DIAGNOSIS — L814 Other melanin hyperpigmentation: Secondary | ICD-10-CM | POA: Diagnosis not present

## 2017-12-05 ENCOUNTER — Encounter: Payer: 59 | Admitting: *Deleted

## 2017-12-05 DIAGNOSIS — I10 Essential (primary) hypertension: Secondary | ICD-10-CM | POA: Diagnosis not present

## 2017-12-05 DIAGNOSIS — R609 Edema, unspecified: Secondary | ICD-10-CM | POA: Diagnosis not present

## 2017-12-05 DIAGNOSIS — E781 Pure hyperglyceridemia: Secondary | ICD-10-CM | POA: Diagnosis not present

## 2017-12-08 LAB — CUP PACEART REMOTE DEVICE CHECK
Implantable Pulse Generator Implant Date: 20160309
MDC IDC SESS DTM: 20190605193945

## 2017-12-15 ENCOUNTER — Other Ambulatory Visit: Payer: Self-pay | Admitting: Internal Medicine

## 2017-12-29 ENCOUNTER — Ambulatory Visit: Payer: 59 | Admitting: Physician Assistant

## 2017-12-29 ENCOUNTER — Telehealth: Payer: Self-pay | Admitting: Physician Assistant

## 2017-12-29 ENCOUNTER — Encounter: Payer: Self-pay | Admitting: Physician Assistant

## 2017-12-29 VITALS — BP 132/80 | HR 91 | Ht 74.0 in | Wt 196.8 lb

## 2017-12-29 DIAGNOSIS — I480 Paroxysmal atrial fibrillation: Secondary | ICD-10-CM | POA: Diagnosis not present

## 2017-12-29 DIAGNOSIS — Z7289 Other problems related to lifestyle: Secondary | ICD-10-CM

## 2017-12-29 DIAGNOSIS — R600 Localized edema: Secondary | ICD-10-CM

## 2017-12-29 DIAGNOSIS — I4892 Unspecified atrial flutter: Secondary | ICD-10-CM

## 2017-12-29 DIAGNOSIS — I10 Essential (primary) hypertension: Secondary | ICD-10-CM | POA: Diagnosis not present

## 2017-12-29 DIAGNOSIS — F1090 Alcohol use, unspecified, uncomplicated: Secondary | ICD-10-CM

## 2017-12-29 NOTE — Progress Notes (Signed)
Cardiology Office Note    Date:  12/29/2017  ID:  Evan, [name] [birth year], MRN [id number] PCP:  Antony Contras, MD  Cardiologist:  Ena Dawley, MD   Chief Complaint: 1 yr f/u afib/flutter  History of Present Illness:  Evan [name] is a 52 y.o. male with history of HTN, atrial fibrillation, atrial flutter, psoriasis, habitual alcohol use, tobacco abuse, lower extremity edema felt to represent chronic diastolic CHF (per Dr. Meda Coffee) who presents for 1 year follow-up.  He was diagnosed with atrial fib 12/2013. He failed therapy with Multaq. At one point he was also on flecainide and diltiazem. He underwent EPS/RFA of atrial fib and second discrete focus (flutter) 04/2014. ILR showed recurrent atrial fib, so he underwent repeat ablation 11/2014 with ablation of atrial fib wtih ablation of second discrete focus of atrial flutter. Due to lack of recurrence, diltiazem and flecainide were stopped in 2016. Metoprolol was weaned in 2017 due to fatigue. He has not had bradycardia before. Last echo 04/2014 showed EF 55-60%, normal LV thickness, mild MR, normal CVP. Cardiac CT 11/2014 showed calcium score of zero with minimal nonobstructive disease in the prox RCA. Last labs 09/2017 showed Mg 1.8, BNP wnl, ammonia wnl, PT/INR wnl, troponin negative, K 4.2, AST 51, ALT 32, Cr 0.96, ETOH neg, Hgb 13.4 with macrocytosis, plt 220. These labs were in setting of left leg pain. LE duplex was negative. Regarding dx of CHF, this was deemed in 2016 after he had called in with some leg swelling after splurging on vacation.  He returns for follow-up overall feeling well. No recurrence of afib/flutter that he's felt. He does take Lasix PRN leg swelling. He continues to drink alcohol, somewhere in the realm of 3-5 drinks per night but not initially forthcoming with this information so it may be more. No CP or dyspnea.  Med list unclear. Had benazepril/amlodipine (40/10) listed as well as Benicar (20). The patient  recalls one of his tablets being cut in 1/4 recently. We called primary care and they did not have benazepril/amlodipine but had amlodipine 2.5mg  listed and Benicar 40mg  listed. The patient will call us tomorrow AM with an updated confirmation.   Past Medical History:  Diagnosis Date  . Anxiety   . Atrial fibrillation ([place])    a. PVI 04/2014 Dr Rayann Heman b. failed medical therapy with Multaq  . Atrial flutter ([place])    a. CTI ablation 04/2014 Dr Rayann Heman  . Hypertension   . Psoriasis   . S/P ablation of atrial fibrillation 12/24/14 12/25/2014    Past Surgical History:  Procedure Laterality Date  . ATRIAL FIBRILLATION ABLATION [name] 05/07/2014   PVI and CTI by Dr Rayann Heman  . CARPAL TUNNEL RELEASE    . ELECTROPHYSIOLOGIC STUDY [name] 12/24/2014   Procedure: Atrial Fibrillation Ablation;  Surgeon: Thompson Grayer, MD;  Location: [place] [facility];  Service: Cardiovascular;  Laterality: [name];  . HERNIA REPAIR Right    with mesh  . LOOP RECORDER IMPLANT [name] 08/07/2014   Procedure: LOOP RECORDER IMPLANT;  Surgeon: Thompson Grayer, MD;  Location: [facility] CATH LAB;  Service: Cardiovascular;  Laterality: [name];  . TEE WITHOUT CARDIOVERSION [name] 05/06/2014   Procedure: TRANSESOPHAGEAL ECHOCARDIOGRAM (TEE);  Surgeon: Pixie Casino, MD;  Location: [facility] ENDOSCOPY;  Service: Cardiovascular;  Laterality: [name];    Current Medications: Current Meds  Medication Sig  . amLODipine-benazepril (LOTREL) 10-40 MG capsule Take 1 capsule by mouth daily.  Marland Kitchen aspirin EC 81 MG tablet Take 1 tablet (81 mg total)  by mouth daily.  Marland Kitchen EYELID CLEANSERS EX Apply 1 application topically 3 (three) times a week.   . Flaxseed, Linseed, (FLAXSEED OIL) 1000 MG CAPS Take 1,000 mg by mouth daily.  . fluocinonide cream (LIDEX) 7.09 % Apply 1 application topically daily as needed (psoriasis).   . furosemide (LASIX) 40 MG tablet TAKE ONE TABLET EVERY DAY AS NEEDED FOR SWELLING  . LORazepam (ATIVAN) 0.5 MG tablet Take 0.5 mg by mouth daily as needed for  anxiety.  . Multiple Vitamin (MULTIVITAMIN WITH MINERALS) TABS Take 1 tablet by mouth daily with supper.   . olmesartan (BENICAR) 20 MG tablet Take 20 mg by mouth daily.  . potassium gluconate 595 MG TABS tablet Take 595 mg by mouth daily with lunch.   . RESTASIS 0.05 % ophthalmic emulsion instill ONE DROP IN EACH EYE TWICE DAILY  . vitamin C (ASCORBIC ACID) 500 MG tablet Take 500 mg by mouth daily.  Marland Kitchen zinc gluconate 50 MG tablet Take 50 mg by mouth daily with supper.     Allergies:   Adhesive [tape] and Eliquis [apixaban]   Social History   Socioeconomic History  . Marital status: Legally Separated    Spouse name: Not on file  . Number of children: Not on file  . Years of education: Not on file  . Highest education level: Not on file  Occupational History  . Not on file  Social Needs  . Financial resource strain: Not on file  . Food insecurity:    Worry: Not on file    Inability: Not on file  . Transportation needs:    Medical: Not on file    Non-medical: Not on file  Tobacco Use  . Smoking status: Former Smoker    Types: Cigarettes, Cigars  . Smokeless tobacco: Never Used  Substance and Sexual Activity  . Alcohol use: Yes    Comment: wine at night to help relax  . Drug use: No  . Sexual activity: Not on file  Lifestyle  . Physical activity:    Days per week: Not on file    Minutes per session: Not on file  . Stress: Not on file  Relationships  . Social connections:    Talks on phone: Not on file    Gets together: Not on file    Attends religious service: Not on file    Active member of club or organization: Not on file    Attends meetings of clubs or organizations: Not on file    Relationship status: Not on file  Other Topics Concern  . Not on file  Social History Narrative  . Not on file     Family History:  The patient's family history includes Diabetes in his mother; Heart attack in his brother; Heart failure in his mother; Hypertension in his  mother.  ROS:   Please see the history of present illness. All other systems are reviewed and otherwise negative.    PHYSICAL EXAM:   VS:  BP 132/80   Pulse 91   Ht 6\' 2"  (1.88 m)   Wt 196 lb 12.8 oz (89.3 kg)   BMI 25.27 kg/m   BMI: Body mass index is 25.27 kg/m. GEN: Well nourished, well developed WM in no acute distress HEENT: normocephalic, atraumatic Neck: no JVD, carotid bruits, or masses Cardiac: RRR; no murmurs, rubs, or gallops, chronic skin thickening with venous-stasis appearing mild pitting trace BLE edema  Respiratory:  clear to auscultation bilaterally, normal work of breathing GI: soft, nontender,  nondistended, + BS MS: no deformity or atrophy Skin: warm and dry, no rash Neuro:  Alert and Oriented x 3, Strength and sensation are intact, follows commands Psych: euthymic mood, full affect  Wt Readings from Last 3 Encounters:  12/29/17 196 lb 12.8 oz (89.3 kg)  09/29/17 200 lb (90.7 kg)  03/25/17 201 lb (91.2 kg)      Studies/Labs Reviewed:   EKG:  EKG was ordered today and personally reviewed by me and demonstrates NSR 91bpm, peaked appearing TW in V3-V4- stable compared to prior.  Recent Labs: 09/29/2017: ALT 32; B Natriuretic Peptide 14.3; BUN 19; Creatinine, Ser 0.96; Hemoglobin 13.4; Magnesium 1.8; Platelets 220; Potassium 4.2; Sodium 136   Lipid Panel    Component Value Date/Time   CHOL 148 10/24/2014 1038   TRIG 73.0 10/24/2014 1038   HDL 56.00 10/24/2014 1038   CHOLHDL 3 10/24/2014 1038   VLDL 14.6 10/24/2014 1038   LDLCALC 77 10/24/2014 1038    Additional studies/ records that were reviewed today include: Summarized above    ASSESSMENT & PLAN:   1. Paroxysmal atrial fib/flutter - quiescent. No longer on anticoagulation - had ablation without further episodes. We did discuss role of ETOH increasing risk of recurrence. 2. Lower extremity edema - given normal BNP and normal echo in the past, I'm not 100% convinced this is CHF. It may be  related to HTN but given his excessive longstanding alcohol use and mildly abnormal LFTs in the past, I am concerned about possibility of early cirrhosis. It would make sense for primary care to work this up in their office rather than cardiology, as he is fairly stable from cardiac standpoint. I told the patient our office would reach out to Dr. Laqueta Linden office to suggest they consider further evaluation. He also may be experiencing fluid retention from excess fluid intake overall (given total drinks per day) as well as HTN. 3. Habitual alcohol use - discussed importance of cutting down. Advised him to reach out to PCP if he needs help or resources. 4. Essential HTN - BP elevated in clinic today, confirmed on recheck. He states this was recently eval'd by primary care and was in the 120s. Amlodipine has been reduced due to edema. If he does in fact end up having confirmed liver disease, spironolactone may be useful as a chronic adjunctive medication. The patient was instructed to monitor their blood pressure at home and to call if tending to run higher than 102 systolic or 80 diastolic at home. He will call office tomorrow to reiterate accurate med list.  Disposition: F/u with Dr. Rayann Heman 02/2018 as scheduled (to discuss ILR explant), and Dr. Meda Coffee in 1 year.  Medication Adjustments/Labs and Tests Ordered: Current medicines are reviewed at length with the patient today.  Concerns regarding medicines are outlined above. Medication changes, Labs and Tests ordered today are summarized above and listed in the Patient Instructions accessible in Encounters.   Signed, Charlie Pitter, PA-C  12/29/2017 4:13 PM    [facility] [place], [place], [place]  [address] Phone: [phone]; Fax: [phone]

## 2017-12-29 NOTE — Telephone Encounter (Signed)
Patient seen in clinic today. I told him I would have our office reach out to his primary care to suggest possible further w/u for liver disease.  He's had issues with lower extremity edema in recent years. However, given normal echo 2015 and normal BNP, I'm not 100% convinced this is all from CHF. Given his excessive longstanding alcohol use and mildly abnormal LFTs in the past, I am concerned about possibility of early cirrhosis. It could still be due to diastolic CHF in setting of excess volume from the amount of fluid he is taking in daily with drinks plus HTN, but I want to make sure we are not ignoring the abnormal LFTs. It would make sense for primary care to work this up in their office rather than cardiology, as he is fairly stable from cardiac standpoint. Can you please relay message to Dr. Laqueta Linden office that we would have him consider liver w/u? Thanks in advance.   Dayna Dunn PA-C

## 2017-12-29 NOTE — Patient Instructions (Addendum)
Medication Instructions:  Your physician recommends that you continue on your current medications as directed. Please refer to the Current Medication list given to you today.   Labwork: None ordered  Testing/Procedures: None ordered  Follow-Up: Your physician wants you to follow-up in: [place] will receive a reminder letter in the mail two months in advance. If you don't receive a letter, please call our office to schedule the follow-up appointment.   Any Other Special Instructions Will Be Listed Below (If Applicable).  Please line up your medicine bottles and call the office when we re-open to clarify what medicines you are taking. At your next visit, bring all the bottles with you to make it easier. You can ask for Rico Junker.  Please monitor your blood pressure occasionally at home. Call your doctor if you tend to get readings of greater than 130 on the top number or 80 on the bottom number.  Please limit your sodium intake to 2,000mg  daily.  It is extremely important to reduce your alcohol intake. If you need help cutting down, please talk to your primary doctor. We will give him call to ask him about whether he might consider doing imaging of your liver to assess whether your liver function has contributed to your swelling.  If you need a refill on your cardiac medications before your next appointment, please call your pharmacy.

## 2017-12-30 NOTE — Telephone Encounter (Signed)
Called Dr. Laqueta Linden office re: concerns from Melina Copa, PA-C, and wanting a work up for pt from PCP re: Liver function. They have scheduled pt an appt 01/09/18 @11 :30.  Called to make pt aware of appt and left him a message to call back.

## 2017-12-30 NOTE — Telephone Encounter (Signed)
Spoke with pt and let him know that the appt with Dr. Moreen Fowler is 01/09/18 @ 11:00. Pt thanked me for the call.   Also, pts list of medications are as follows:  Amdlodipine 10 mg taking 1/4 tablet daily Olmesartan Medoxomil 40 mg taking 1 tablet daily

## 2017-12-30 NOTE — Telephone Encounter (Addendum)
Noted, thanks. Continue plan as discussed with home BP monitoring. No med changes at this time. Sebrena Engh PA-C

## 2017-12-30 NOTE — Addendum Note (Signed)
Addended by: Gaetano Net on: 12/30/2017 02:32 PM   Modules accepted: Orders

## 2018-01-09 DIAGNOSIS — I10 Essential (primary) hypertension: Secondary | ICD-10-CM | POA: Diagnosis not present

## 2018-01-09 DIAGNOSIS — R945 Abnormal results of liver function studies: Secondary | ICD-10-CM | POA: Diagnosis not present

## 2018-01-09 DIAGNOSIS — R600 Localized edema: Secondary | ICD-10-CM | POA: Diagnosis not present

## 2018-01-09 DIAGNOSIS — R609 Edema, unspecified: Secondary | ICD-10-CM | POA: Diagnosis not present

## 2018-01-11 ENCOUNTER — Other Ambulatory Visit: Payer: Self-pay | Admitting: Family Medicine

## 2018-01-11 DIAGNOSIS — R7989 Other specified abnormal findings of blood chemistry: Secondary | ICD-10-CM

## 2018-01-11 DIAGNOSIS — R945 Abnormal results of liver function studies: Principal | ICD-10-CM

## 2018-01-12 ENCOUNTER — Other Ambulatory Visit: Payer: Self-pay

## 2018-01-19 ENCOUNTER — Other Ambulatory Visit: Payer: Self-pay | Admitting: Family Medicine

## 2018-01-19 ENCOUNTER — Ambulatory Visit
Admission: RE | Admit: 2018-01-19 | Discharge: 2018-01-19 | Disposition: A | Discharge status: Home / Self Care | Payer: 59 | Source: Ambulatory Visit | Attending: Family Medicine | Admitting: Family Medicine

## 2018-01-19 DIAGNOSIS — R945 Abnormal results of liver function studies: Principal | ICD-10-CM

## 2018-01-19 DIAGNOSIS — R7989 Other specified abnormal findings of blood chemistry: Secondary | ICD-10-CM

## 2018-01-19 DIAGNOSIS — N281 Cyst of kidney, acquired: Secondary | ICD-10-CM | POA: Diagnosis not present

## 2018-01-26 DIAGNOSIS — R945 Abnormal results of liver function studies: Secondary | ICD-10-CM | POA: Diagnosis not present

## 2018-01-26 DIAGNOSIS — Z23 Encounter for immunization: Secondary | ICD-10-CM | POA: Diagnosis not present

## 2018-02-21 ENCOUNTER — Encounter: Payer: Self-pay | Admitting: Internal Medicine

## 2018-03-08 ENCOUNTER — Encounter: Payer: Self-pay | Admitting: Internal Medicine

## 2018-03-08 ENCOUNTER — Ambulatory Visit: Payer: 59 | Admitting: Internal Medicine

## 2018-03-08 ENCOUNTER — Encounter (INDEPENDENT_AMBULATORY_CARE_PROVIDER_SITE_OTHER): Payer: Self-pay

## 2018-03-08 VITALS — BP 120/76 | HR 74 | Ht 74.0 in | Wt 191.6 lb

## 2018-03-08 DIAGNOSIS — I480 Paroxysmal atrial fibrillation: Secondary | ICD-10-CM | POA: Diagnosis not present

## 2018-03-08 DIAGNOSIS — I10 Essential (primary) hypertension: Secondary | ICD-10-CM | POA: Diagnosis not present

## 2018-03-08 HISTORY — PX: OTHER SURGICAL HISTORY: SHX169

## 2018-03-08 NOTE — Progress Notes (Signed)
PCP: Antony Contras, MD Primary Cardiologist: Dr Meda Coffee Primary EP: Dr Derenda Fennel is a 52 y.o. male who presents today for routine electrophysiology followup.  Since last being seen in our clinic, the patient reports doing very well.  Today, he denies symptoms of palpitations, chest pain, shortness of breath,  lower extremity edema, dizziness, presyncope, or syncope.  The patient is otherwise without complaint today.   Past Medical History:  Diagnosis Date  . Anxiety   . Atrial fibrillation ([place])    a. PVI 04/2014 Dr Rayann Heman b. failed medical therapy with Multaq  . Atrial flutter ([place])    a. CTI ablation 04/2014 Dr Rayann Heman  . Hypertension   . Psoriasis   . S/P ablation of atrial fibrillation 12/24/14 12/25/2014   Past Surgical History:  Procedure Laterality Date  . ATRIAL FIBRILLATION ABLATION [name] 05/07/2014   PVI and CTI by Dr Rayann Heman  . CARPAL TUNNEL RELEASE    . ELECTROPHYSIOLOGIC STUDY [name] 12/24/2014   Procedure: Atrial Fibrillation Ablation;  Surgeon: Thompson Grayer, MD;  Location: [facility];  Service: Cardiovascular;  Laterality: [name];  . HERNIA REPAIR Right    with mesh  . LOOP RECORDER IMPLANT [name] 08/07/2014   Procedure: LOOP RECORDER IMPLANT;  Surgeon: Thompson Grayer, MD;  Location[facility] CATH LAB;  Service: Cardiovascular;  Laterality: [name];  . TEE WITHOUT CARDIOVERSION [name] 05/06/2014   Procedure: TRANSESOPHAGEAL ECHOCARDIOGRAM (TEE);  Surgeon: Pixie Casino, MD;  Location: [facility] ENDOSCOPY;  Service: Cardiovascular;  Laterality: [name];    ROS- all systems are reviewed and negatives except as per HPI above  Current Outpatient Medications  Medication Sig Dispense Refill  . amLODipine (NORVASC) 2.5 MG tablet Take 2.5 mg by mouth daily.    Marland Kitchen aspirin EC 81 MG tablet Take 1 tablet (81 mg total) by mouth daily. 90 tablet 3  . EYELID CLEANSERS EX Apply 1 application topically 3 (three) times a week.     . Flaxseed, Linseed, (FLAXSEED OIL) 1000 MG CAPS Take 1,000 mg by  mouth daily.    . fluocinonide cream (LIDEX) 4.09 % Apply 1 application topically daily as needed (psoriasis).   0  . furosemide (LASIX) 40 MG tablet TAKE ONE TABLET EVERY DAY AS NEEDED FOR SWELLING 30 tablet 6  . LORazepam (ATIVAN) 0.5 MG tablet Take 0.5 mg by mouth daily as needed for anxiety.    . Multiple Vitamin (MULTIVITAMIN WITH MINERALS) TABS Take 1 tablet by mouth daily with supper.     . olmesartan (BENICAR) 40 MG tablet Take 40 mg by mouth daily.    . potassium gluconate 595 MG TABS tablet Take 595 mg by mouth daily with lunch.     . RESTASIS 0.05 % ophthalmic emulsion instill ONE DROP IN EACH EYE TWICE DAILY  3  . vitamin C (ASCORBIC ACID) 500 MG tablet Take 500 mg by mouth daily.    Marland Kitchen zinc gluconate 50 MG tablet Take 50 mg by mouth daily with supper.      No current facility-administered medications for this visit.     Physical Exam: Vitals:   03/08/18 1516  BP: 120/76  Pulse: 74  SpO2: 98%  Weight: 191 lb 9.6 oz (86.9 kg)  Height: 6\' 2"  (1.88 m)    GEN- The patient is well appearing, alert and oriented x 3 today.   Head- normocephalic, atraumatic Eyes-  Sclera clear, conjunctiva pink Ears- hearing intact Oropharynx- clear Lungs- Clear to ausculation bilaterally, normal work of breathing Heart- Regular  rate and rhythm, no murmurs, rubs or gallops, PMI not laterally displaced GI- soft, NT, ND, + BS Extremities- no clubbing, cyanosis, or edema  Wt Readings from Last 3 Encounters:  03/08/18 191 lb 9.6 oz (86.9 kg)  12/29/17 196 lb 12.8 oz (89.3 kg)  09/29/17 200 lb (90.7 kg)    EKG tracing ordered today is personally reviewed and shows sinus rhythm, normal ekg  Assessment and Plan:  1. Paroxysmal atrial fibrillation No recurrence post ablation off AAD therapy chads2vasc score is 1.  Not on anticoagulation ILR is reviewed at length and confirms no afib.  The device is at end of service.  Today, we discussed ILR removal.  He is very clear that he would like to  have the device removed.  Risks of this procedure were discussed with the patient who wishes to proceed at this time.  2. HTN Stable No change required today   Thompson Grayer MD, [facility] 03/08/2018 3:28 PM  Procedure Note   SURGEON:  Thompson Grayer, MD         PROCEDURES:   1. Implantable loop recorder explantation       DESCRIPTION OF PROCEDURE:  Informed written consent was obtained.  The patient required no sedation for the procedure today.   The patients left chest was therefore prepped and draped in the usual sterile fashion.  The skin overlying the ILR monitor was infiltrated with lidocaine for local analgesia.  A 0.5-cm incision was made over the site.  The previously implanted ILR was exposed and removed using a combination of sharp and blunt dissection.  Steri- Strips and a sterile dressing were then applied. EBL<14ml.  There were no early apparent complications.     CONCLUSIONS:   1. Successful explantation of a [employer] Reveal LINQ implantable loop recorder   2. No early apparent complications.        Thompson Grayer MD, [facility] 03/08/2018 3:53 PM

## 2018-03-08 NOTE — Patient Instructions (Signed)
Medication Instructions:  Your physician recommends that you continue on your current medications as directed. Please refer to the Current Medication list given to you today.  Labwork: None ordered.  Testing/Procedures: None ordered.  Follow-Up: Your physician wants you to follow-up in: 7-10 days for a wound check with device clinic.  Your physician wants you to follow-up in: 6 months with Dr. Rayann Heman    Implantable Loop Recorder Placement, Care After Refer to this sheet in the next few weeks. These instructions provide you with information about caring for yourself after your procedure. Your health care provider may also give you more specific instructions. Your treatment has been planned according to current medical practices, but problems sometimes occur. Call your health care provider if you have any problems or questions after your procedure. What can I expect after the procedure? After the procedure, it is common to have:  Soreness or pain near the cut from surgery (incision).  Some swelling or bruising near the incision.  Follow these instructions at home:  Medicines  Take over-the-counter and prescription medicines only as told by your health care provider.  If you were prescribed an antibiotic medicine, take it as told by your health care provider. Do not stop taking the antibiotic even if you start to feel better.  Bathing Do not take baths, swim, or use a hot tub until your health care provider approves. You may shower 24 hours after removal of your monitor.  Incision care  Follow instructions from your health care provider about how to take care of your incision. Make sure you: ? Remove your top dressing after 24 hours (before you shower) ? Leave stitches (sutures), skin glue, or adhesive strips in place. These skin closures may need to stay in place for 2 weeks or longer. If adhesive strip edges start to loosen and curl up, you may trim the loose edges. Do not  remove adhesive strips completely unless your health care provider tells you to do that.  Check your incision area every day for signs of infection. Check for: ? More redness, swelling, or pain. ? Fluid or blood. ? Warmth. ? Pus or a bad smell.  Contact a health care provider if:  You have more redness, swelling, or pain around your incision.  You have more fluid or blood coming from your incision.  Your incision feels warm to the touch.  You have pus or a bad smell coming from your incision.  You have a fever.  You have pain that is not relieved by your pain medicine.  You have triggered your device because of fainting (syncope) or because of a heartbeat that feels like it is racing, slow, fluttering, or skipping (palpitations).

## 2018-03-12 DIAGNOSIS — T8189XA Other complications of procedures, not elsewhere classified, initial encounter: Secondary | ICD-10-CM | POA: Diagnosis not present

## 2018-03-13 LAB — CUP PACEART INCLINIC DEVICE CHECK
Date Time Interrogation Session: 20191014092838
Implantable Pulse Generator Implant Date: 20160309

## 2018-03-22 ENCOUNTER — Ambulatory Visit (INDEPENDENT_AMBULATORY_CARE_PROVIDER_SITE_OTHER): Payer: 59 | Admitting: *Deleted

## 2018-03-22 DIAGNOSIS — I480 Paroxysmal atrial fibrillation: Secondary | ICD-10-CM

## 2018-03-22 NOTE — Progress Notes (Signed)
Wound check appointment s/p LINQ explant. Steri-strips removed. Wound without redness or edema. Incision edges approximated, wound well healed. Patient educated about wound care and signs/symptoms of infection. ROV with JA 09/11/18

## 2018-04-17 ENCOUNTER — Other Ambulatory Visit: Payer: Self-pay | Admitting: Cardiology

## 2018-04-17 DIAGNOSIS — I480 Paroxysmal atrial fibrillation: Secondary | ICD-10-CM

## 2018-04-17 DIAGNOSIS — I10 Essential (primary) hypertension: Secondary | ICD-10-CM

## 2018-06-30 DIAGNOSIS — I10 Essential (primary) hypertension: Secondary | ICD-10-CM | POA: Diagnosis not present

## 2018-06-30 DIAGNOSIS — Z0000 Encounter for general adult medical examination without abnormal findings: Secondary | ICD-10-CM | POA: Diagnosis not present

## 2018-06-30 DIAGNOSIS — Z125 Encounter for screening for malignant neoplasm of prostate: Secondary | ICD-10-CM | POA: Diagnosis not present

## 2018-07-19 DIAGNOSIS — Z1283 Encounter for screening for malignant neoplasm of skin: Secondary | ICD-10-CM | POA: Diagnosis not present

## 2018-07-19 DIAGNOSIS — D225 Melanocytic nevi of trunk: Secondary | ICD-10-CM | POA: Diagnosis not present

## 2018-07-19 DIAGNOSIS — L308 Other specified dermatitis: Secondary | ICD-10-CM | POA: Diagnosis not present

## 2018-08-01 ENCOUNTER — Other Ambulatory Visit: Payer: Self-pay | Admitting: Podiatry

## 2018-08-01 ENCOUNTER — Ambulatory Visit (INDEPENDENT_AMBULATORY_CARE_PROVIDER_SITE_OTHER): Payer: 59

## 2018-08-01 ENCOUNTER — Ambulatory Visit: Payer: 59 | Admitting: Podiatry

## 2018-08-01 VITALS — BP 108/59 | HR 75

## 2018-08-01 DIAGNOSIS — M2041 Other hammer toe(s) (acquired), right foot: Secondary | ICD-10-CM | POA: Diagnosis not present

## 2018-08-01 DIAGNOSIS — M79675 Pain in left toe(s): Secondary | ICD-10-CM | POA: Diagnosis not present

## 2018-08-01 DIAGNOSIS — M2042 Other hammer toe(s) (acquired), left foot: Secondary | ICD-10-CM

## 2018-08-01 DIAGNOSIS — M79674 Pain in right toe(s): Secondary | ICD-10-CM

## 2018-08-01 DIAGNOSIS — M79672 Pain in left foot: Secondary | ICD-10-CM

## 2018-08-01 NOTE — Patient Instructions (Addendum)
Look at going to the "[employer] [employer] at [employer] I would look for an "extra depth" shoe to help take pressure off of your toes.    Hammer Toe  Hammer toe is a change in the shape (a deformity) of your toe. The deformity causes the middle joint of your toe to stay bent. This causes pain, especially when you are wearing shoes. Hammer toe starts gradually. At first, the toe can be straightened. Gradually over time, the deformity becomes stiff and permanent. Early treatments to keep the toe straight may relieve pain. As the deformity becomes stiff and permanent, surgery may be needed to straighten the toe. What are the causes? Hammer toe is caused by abnormal bending of the toe joint that is closest to your foot. It happens gradually over time. This pulls on the muscles and connections (tendons) of the toe joint, making them weak and stiff. It is often related to wearing shoes that are too short or narrow and do not let your toes straighten. What increases the risk? You may be at greater risk for hammer toe if you:  Are male.  Are older.  Wear shoes that are too small.  Wear high-heeled shoes that pinch your toes.  Are a Engineer, mining.  Have a second toe that is longer than your big toe (first toe).  Injure your foot or toe.  Have arthritis.  Have a family history of hammer toe.  Have a nerve or muscle disorder. What are the signs or symptoms? The main symptoms of this condition are pain and deformity of the toe. The pain is worse when wearing shoes, walking, or running. Other symptoms may include:  Corns or calluses over the bent part of the toe or between the toes.  Redness and a burning feeling on the toe.  An open sore that forms on the top of the toe.  Not being able to straighten the toe. How is this diagnosed? This condition is diagnosed based on your symptoms and a physical exam. During the exam, your health care provider will try to straighten your toe  to see how stiff the deformity is. You may also have tests, such as:  A blood test to check for rheumatoid arthritis.  An X-ray to show how severe the deformity is. How is this treated? Treatment for this condition will depend on how stiff the deformity is. Surgery is often needed. However, sometimes a hammer toe can be straightened without surgery. Treatments that do not involve surgery include:  Taping the toe into a straightened position.  Using pads and cushions to protect the toe (orthotics).  Wearing shoes that provide enough room for the toes.  Doing toe-stretching exercises at home.  Taking an NSAID to reduce pain and swelling. If these treatments do not help or the toe cannot be straightened, surgery is the next option. The most common surgeries used to straighten a hammer toe include:  Arthroplasty. In this procedure, part of the joint is removed, and that allows the toe to straighten.  Fusion. In this procedure, cartilage between the two bones of the joint is taken out and the bones are fused together into one longer bone.  Implantation. In this procedure, part of the bone is removed and replaced with an implant to let the toe move again.  Flexor tendon transfer. In this procedure, the tendons that curl the toes down (flexor tendons) are repositioned. Follow these instructions at home:  Take over-the-counter and prescription medicines only  as told by your health care provider.  Do toe straightening and stretching exercises as told by your health care provider.  Keep all follow-up visits as told by your health care provider. This is important. How is this prevented?  Wear shoes that give your toes enough room and do not cause pain.  Do not wear high-heeled shoes. Contact a health care provider if:  Your pain gets worse.  Your toe becomes red or swollen.  You develop an open sore on your toe. This information is not intended to replace advice given to you by your  health care provider. Make sure you discuss any questions you have with your health care provider. Document Released: 05/14/2000 Document Revised: 12/13/2016 Document Reviewed: 09/10/2015 Elsevier Interactive Patient Education  [employer].

## 2018-08-08 DIAGNOSIS — M2041 Other hammer toe(s) (acquired), right foot: Secondary | ICD-10-CM | POA: Insufficient documentation

## 2018-08-08 DIAGNOSIS — M2042 Other hammer toe(s) (acquired), left foot: Principal | ICD-10-CM

## 2018-08-08 NOTE — Progress Notes (Signed)
Subjective:   Patient ID: Evan [name], male   DOB: 53 y.o.   MRN: [id number]   HPI 53 year old male presents the office today for concerns of painful hammertoes mostly to his left fourth toe the also to his digits 2, 3.  He states this is mostly only with certain work shoes.  He states when he wears regular shoes he has no issues.  He denies any recent injury or trauma to his feet no swelling or redness.  Numbness or tingling.  No other concerns.   Review of Systems  All other systems reviewed and are negative.  Past Medical History:  Diagnosis Date  . Anxiety   . Atrial fibrillation ([place])    a. PVI 04/2014 Dr Rayann Heman b. failed medical therapy with Multaq  . Atrial flutter ([place])    a. CTI ablation 04/2014 Dr Rayann Heman  . Hypertension   . Psoriasis   . S/P ablation of atrial fibrillation 12/24/14 12/25/2014    Past Surgical History:  Procedure Laterality Date  . ATRIAL FIBRILLATION ABLATION [name] 05/07/2014   PVI and CTI by Dr Rayann Heman  . CARPAL TUNNEL RELEASE    . ELECTROPHYSIOLOGIC STUDY [name] 12/24/2014   Procedure: Atrial Fibrillation Ablation;  Surgeon: Thompson Grayer, MD;  Location: [facility];  Service: Cardiovascular;  Laterality: [name];  . HERNIA REPAIR Right    with mesh  . loop recorder explant  03/08/2018   MDT LINQ removed for end of battery service by Dr Rayann Heman  . LOOP RECORDER IMPLANT [name] 08/07/2014   Procedure: LOOP RECORDER IMPLANT;  Surgeon: Thompson Grayer, MD;  Location: [facility] CATH LAB;  Service: Cardiovascular;  Laterality: [name];  . TEE WITHOUT CARDIOVERSION [name] 05/06/2014   Procedure: TRANSESOPHAGEAL ECHOCARDIOGRAM (TEE);  Surgeon: Pixie Casino, MD;  Location: [facility] ENDOSCOPY;  Service: Cardiovascular;  Laterality: [name];     Current Outpatient Medications:  .  amLODipine (NORVASC) 2.5 MG tablet, Take 2.5 mg by mouth daily., Disp: , Rfl:  .  aspirin EC 81 MG tablet, Take 1 tablet (81 mg total) by mouth daily., Disp: 90 tablet, Rfl: 3 .  EYELID CLEANSERS EX, Apply 1  application topically 3 (three) times a week. , Disp: , Rfl:  .  Flaxseed, Linseed, (FLAXSEED OIL) 1000 MG CAPS, Take 1,000 mg by mouth daily., Disp: , Rfl:  .  fluocinonide cream (LIDEX) 8.24 %, Apply 1 application topically daily as needed (psoriasis). , Disp: , Rfl: 0 .  fluticasone (CUTIVATE) 0.05 % cream, APPLY TO BETWEEN LEGS TWICE DAILY AS NEEDED, Disp: , Rfl:  .  furosemide (LASIX) 40 MG tablet, TAKE ONE TABLET EVERY DAY AS NEEDED FOR SWELLING, Disp: 30 tablet, Rfl: 6 .  LORazepam (ATIVAN) 0.5 MG tablet, Take 0.5 mg by mouth daily as needed for anxiety., Disp: , Rfl:  .  mirtazapine (REMERON) 15 MG tablet, 1 tablet at bedtime Once a day Orally 90 day(s), Disp: , Rfl:  .  Multiple Vitamin (MULTIVITAMIN WITH MINERALS) TABS, Take 1 tablet by mouth daily with supper. , Disp: , Rfl:  .  olmesartan (BENICAR) 40 MG tablet, Take 40 mg by mouth daily., Disp: , Rfl:  .  potassium gluconate 595 MG TABS tablet, Take 595 mg by mouth daily with lunch. , Disp: , Rfl:  .  RESTASIS 0.05 % ophthalmic emulsion, instill ONE DROP IN [facility] EYE TWICE DAILY, Disp: , Rfl: 3 .  vitamin C (ASCORBIC ACID) 500 MG tablet, Take 500 mg by mouth daily., Disp: , Rfl:  .  zinc gluconate 50 MG tablet, Take 50 mg by mouth daily with supper. , Disp: , Rfl:   Allergies  Allergen Reactions  . Adhesive [Tape] Hives, Itching and Dermatitis    From monitor electrodes, even the sensitive skin ones  . Eliquis [Apixaban] Hives, Itching and Dermatitis    Has not confirmed yet per pt (04/22/14)  He believes it was the electrodes causing the issue because it was only underneath where electrodes from the monitor were placed         Objective:  Physical Exam  General: AAO x3, NAD  Dermatological: Skin is warm, dry and supple bilateral. Nails x 10 are well manicured; remaining integument appears unremarkable at this time. There are no open sores, no preulcerative lesions, no rash or signs of infection present.  Vascular: Dorsalis  Pedis artery and Posterior Tibial artery pedal pulses are 2/4 bilateral with immedate capillary fill time. There is no pain with calf compression, swelling, warmth, erythema.   Neruologic: Grossly intact via light touch bilateral. Protective threshold with Semmes Wienstein monofilament intact to all pedal sites bilateral.   Musculoskeletal: Semirigid hammertoe contracture present to the lesser digits.  There is no tenderness palpation on exam is only with wearing certain work shoes.  Unable to elicit any tenderness bilateral feet today.  There is no edema, erythema, increased warmth.  Muscular strength 5/5 in all groups tested bilateral.  Gait: Unassisted, Nonantalgic.      Assessment:   Symptomatic hammertoe deformity    Plan:  -Treatment options discussed including all alternatives, risks, and complications -Etiology of symptoms were discussed -X-rays were obtained and reviewed with the patient.  Hammertoe contractures are present there is no evidence of acute fracture. -We discussed changing shoes and wearing a extra-depth shoe.  We also discussed exercise to help with hammertoes.  Offloading pads discussed and dispensed as well.  Return if symptoms worsen or fail to improve.  Trula Slade DPM

## 2018-09-11 ENCOUNTER — Encounter: Payer: Self-pay | Admitting: Internal Medicine

## 2018-09-11 ENCOUNTER — Other Ambulatory Visit: Payer: Self-pay

## 2018-09-11 ENCOUNTER — Telehealth (INDEPENDENT_AMBULATORY_CARE_PROVIDER_SITE_OTHER): Payer: 59 | Admitting: Internal Medicine

## 2018-09-11 DIAGNOSIS — I480 Paroxysmal atrial fibrillation: Secondary | ICD-10-CM | POA: Diagnosis not present

## 2018-09-11 DIAGNOSIS — I10 Essential (primary) hypertension: Secondary | ICD-10-CM

## 2018-09-11 NOTE — Progress Notes (Signed)
Electrophysiology TeleHealth Note   Due to national recommendations of social distancing due to COVID 19, an audio/video telehealth visit is felt to be most appropriate for this patient at this time.  Verbal consent was obtained for the visit today.  We connected virtually with [employer].   Date:  09/11/2018   ID:  Evan [name], DOB [birth year], MRN [id number]  Location: patient's home  Provider location: [address] [place]  Evaluation Performed: Follow-up visit  PCP:  Antony Contras, MD  Cardiologist:  Ena Dawley, MD  Electrophysiologist:  Dr Rayann Heman  Chief Complaint:  afib  History of Present Illness:    Evan [name] is a 53 y.o. male who presents via audio/video conferencing for a telehealth visit today.  Since last being seen in our clinic, the patient reports doing very well.  Today, he denies symptoms of palpitations, chest pain, shortness of breath,  lower extremity edema, dizziness, presyncope, or syncope.  The patient is otherwise without complaint today.  The patient denies symptoms of fevers, chills, cough, or new SOB worrisome for COVID 19.  Past Medical History:  Diagnosis Date  . Anxiety   . Atrial fibrillation ([place])    a. PVI 04/2014 Dr Rayann Heman b. failed medical therapy with Multaq  . Atrial flutter ([place])    a. CTI ablation 04/2014 Dr Rayann Heman  . Hypertension   . Psoriasis   . S/P ablation of atrial fibrillation 12/24/14 12/25/2014    Past Surgical History:  Procedure Laterality Date  . ATRIAL FIBRILLATION ABLATION [name] 05/07/2014   PVI and CTI by Dr Rayann Heman  . CARPAL TUNNEL RELEASE    . ELECTROPHYSIOLOGIC STUDY [name] 12/24/2014   Procedure: Atrial Fibrillation Ablation;  Surgeon: Thompson Grayer, MD;  Location: [facility];  Service: Cardiovascular;  Laterality: [name];  . HERNIA REPAIR Right    with mesh  . loop recorder explant  03/08/2018   MDT LINQ removed for end of battery service by Dr Rayann Heman  . LOOP RECORDER IMPLANT [name] 08/07/2014   Procedure: LOOP RECORDER IMPLANT;  Surgeon: Thompson Grayer, MD;  Location: [facility] CATH LAB;  Service: Cardiovascular;  Laterality: [name];  . TEE WITHOUT CARDIOVERSION [name] 05/06/2014   Procedure: TRANSESOPHAGEAL ECHOCARDIOGRAM (TEE);  Surgeon: Pixie Casino, MD;  Location[facility] ENDOSCOPY;  Service: Cardiovascular;  Laterality: [name];    Current Outpatient Medications  Medication Sig Dispense Refill  . amLODipine (NORVASC) 2.5 MG tablet Take 2.5 mg by mouth daily.    Marland Kitchen aspirin EC 81 MG tablet Take 1 tablet (81 mg total) by mouth daily. 90 tablet 3  . EYELID CLEANSERS EX Apply 1 application topically 3 (three) times a week.     . Flaxseed, Linseed, (FLAXSEED OIL) 1000 MG CAPS Take 1,000 mg by mouth daily.    . fluocinonide cream (LIDEX) 9.62 % Apply 1 application topically daily as needed (psoriasis).   0  . fluticasone (CUTIVATE) 0.05 % cream APPLY TO BETWEEN LEGS TWICE DAILY AS NEEDED    . furosemide (LASIX) 40 MG tablet TAKE ONE TABLET EVERY DAY AS NEEDED FOR SWELLING 30 tablet 6  . LORazepam (ATIVAN) 0.5 MG tablet Take 0.5 mg by mouth daily as needed for anxiety.    . mirtazapine (REMERON) 15 MG tablet 1 tablet at bedtime Once a day Orally 90 day(s)    . Multiple Vitamin (MULTIVITAMIN WITH MINERALS) TABS Take 1 tablet by mouth daily with supper.     . olmesartan (BENICAR) 40 MG tablet Take 40 mg  by mouth daily.    . potassium gluconate 595 MG TABS tablet Take 595 mg by mouth daily with lunch.     . RESTASIS 0.05 % ophthalmic emulsion instill ONE DROP IN EACH EYE TWICE DAILY  3  . vitamin C (ASCORBIC ACID) 500 MG tablet Take 500 mg by mouth daily.    Marland Kitchen zinc gluconate 50 MG tablet Take 50 mg by mouth daily with supper.      No current facility-administered medications for this visit.     Allergies:   Adhesive [tape] and Eliquis [apixaban]   Social History:  The patient  reports that he has quit smoking. His smoking use included cigarettes and cigars. He has never used smokeless tobacco. He reports  current alcohol use. He reports that he does not use drugs.   Family History:  The patient's  family history includes Diabetes in his mother; Heart attack in his brother; Heart failure in his mother; Hypertension in his mother.   ROS:  Please see the history of present illness.   All other systems are personally reviewed and negative.    Exam:    Vital Signs:  BP 137/79   Pulse 91   Well appearing, alert and conversant, regular work of breathing,  good skin color Eyes- anicteric, neuro- grossly intact, skin- no apparent rash or lesions or cyanosis, mouth- oral mucosa is pink   Labs/Other Tests and Data Reviewed:    Recent Labs: 09/29/2017: ALT 32; B Natriuretic Peptide 14.3; BUN 19; Creatinine, Ser 0.96; Hemoglobin 13.4; Magnesium 1.8; Platelets 220; Potassium 4.2; Sodium 136   Wt Readings from Last 3 Encounters:  03/08/18 191 lb 9.6 oz (86.9 kg)  12/29/17 196 lb 12.8 oz (89.3 kg)  09/29/17 200 lb (90.7 kg)     Other studies personally reviewed: Additional studies/ records that were reviewed today include: my prior note  Review of the above records today demonstrates: as above Prior radiographs: cardiac CT 12/16/14- reviewed    ASSESSMENT & PLAN:    1.  Paroxysmal atrial fibrillation Doing very well post ablation off AAD therapy ILR has been removed for EOS. No  Symptoms of arrhythmia chads2vasc score is 1.  Not on anticoagulation as per guidelines  2. HTN Stable No change required today  3. COVID 19 screen The patient denies symptoms of COVID 19 at this time.  The importance of social distancing was discussed today.  He works at [employer] but is dong his best  Follow-up:  Me in the office in 6 months  Current medicines are reviewed at length with the patient today.   The patient has concerns regarding his medicines.  The following changes were made today:  none  Labs/ tests ordered today include:  No orders of the defined types were placed in this encounter.    Patient Risk:  after full review of this patients clinical status, I feel that they are at moderate risk at this time.  Today, I have spent 10 minutes with the patient with telehealth technology discussing afib .    Army Fossa, MD  09/11/2018 3:06 PM     [address] [place] [address] [phone] (office) [phone] (fax)

## 2019-03-21 ENCOUNTER — Ambulatory Visit: Payer: 59 | Admitting: Internal Medicine

## 2019-03-26 ENCOUNTER — Encounter: Payer: Self-pay | Admitting: Internal Medicine

## 2019-03-26 ENCOUNTER — Other Ambulatory Visit: Payer: Self-pay

## 2019-03-26 ENCOUNTER — Telehealth (INDEPENDENT_AMBULATORY_CARE_PROVIDER_SITE_OTHER): Payer: 59 | Admitting: Internal Medicine

## 2019-03-26 VITALS — BP 129/78 | HR 80

## 2019-03-26 DIAGNOSIS — I10 Essential (primary) hypertension: Secondary | ICD-10-CM

## 2019-03-26 DIAGNOSIS — I480 Paroxysmal atrial fibrillation: Secondary | ICD-10-CM | POA: Diagnosis not present

## 2019-03-26 NOTE — Progress Notes (Signed)
Electrophysiology TeleHealth Note  Due to national recommendations of social distancing due to [place] 19, an audio telehealth visit is felt to be most appropriate for this patient at this time.  Verbal consent was obtained by me for the telehealth visit today.  The patient does not have capability for a virtual visit.  A phone visit is therefore required today.   Date:  03/26/2019   ID:  Evan [name], DOB 1965-12-20, MRN [id number]  Location: patient's home  Provider location:  [facility]  Evaluation Performed: Follow-up visit  PCP:  Antony Contras, MD   Electrophysiologist:  Dr Rayann Heman  Chief Complaint:  palpitations  History of Present Illness:    Evan [name] is a 53 y.o. male who presents via telehealth conferencing today.  Since last being seen in our clinic, the patient reports doing very well.  Still working at [employer] on [place].  Today, he denies symptoms of palpitations, chest pain, shortness of breath,  lower extremity edema, dizziness, presyncope, or syncope.  The patient is otherwise without complaint today.  The patient denies symptoms of fevers, chills, cough, or new SOB worrisome for COVID 19.  Past Medical History:  Diagnosis Date  . Anxiety   . Atrial fibrillation ([place])    a. PVI 04/2014 Dr Rayann Heman b. failed medical therapy with Multaq  . Atrial flutter ([employer])    a. CTI ablation 04/2014 Dr Rayann Heman  . Hypertension   . Psoriasis   . S/P ablation of atrial fibrillation 12/24/14 12/25/2014    Past Surgical History:  Procedure Laterality Date  . ATRIAL FIBRILLATION ABLATION [name] 05/07/2014   PVI and CTI by Dr Rayann Heman  . CARPAL TUNNEL RELEASE    . ELECTROPHYSIOLOGIC STUDY [name] 12/24/2014   Procedure: Atrial Fibrillation Ablation;  Surgeon: Thompson Grayer, MD;  Location: [facility];  Service: Cardiovascular;  Laterality: [name];  . HERNIA REPAIR Right    with mesh  . loop recorder explant  03/08/2018   MDT LINQ removed for end of battery service by Dr  Rayann Heman  . LOOP RECORDER IMPLANT [name] 08/07/2014   Procedure: LOOP RECORDER IMPLANT;  Surgeon: Thompson Grayer, MD;  Location: [facility] CATH LAB;  Service: Cardiovascular;  Laterality: [name];  . TEE WITHOUT CARDIOVERSION [name] 05/06/2014   Procedure: TRANSESOPHAGEAL ECHOCARDIOGRAM (TEE);  Surgeon: Pixie Casino, MD;  Location: [facility] ENDOSCOPY;  Service: Cardiovascular;  Laterality: [name];    Current Outpatient Medications  Medication Sig Dispense Refill  . amLODipine (NORVASC) 2.5 MG tablet Take 2.5 mg by mouth daily.    Marland Kitchen aspirin EC 81 MG tablet Take 1 tablet (81 mg total) by mouth daily. 90 tablet 3  . EYELID CLEANSERS EX Apply 1 application topically 3 (three) times a week.     . Flaxseed, Linseed, (FLAXSEED OIL) 1000 MG CAPS Take 1,000 mg by mouth daily.    . fluocinonide cream (LIDEX) [id number] % Apply 1 application topically daily as needed (psoriasis).   0  . fluticasone (CUTIVATE) 0.05 % cream APPLY TO BETWEEN LEGS TWICE DAILY AS NEEDED    . furosemide (LASIX) 40 MG tablet TAKE ONE TABLET EVERY DAY AS NEEDED FOR SWELLING 30 tablet 6  . LORazepam (ATIVAN) 0.5 MG tablet Take 0.5 mg by mouth daily as needed for anxiety.    . mirtazapine (REMERON) 15 MG tablet 1 tablet at bedtime Once a day Orally 90 day(s)    . Multiple Vitamin (MULTIVITAMIN WITH MINERALS) TABS Take 1 tablet by mouth daily with supper.     Marland Kitchen  olmesartan (BENICAR) 40 MG tablet Take 40 mg by mouth daily.    . potassium gluconate 595 MG TABS tablet Take 595 mg by mouth daily with lunch.     . RESTASIS 0.05 % ophthalmic emulsion instill ONE DROP IN EACH EYE TWICE DAILY  3  . vitamin C (ASCORBIC ACID) 500 MG tablet Take 500 mg by mouth daily.    Marland Kitchen zinc gluconate 50 MG tablet Take 50 mg by mouth daily with supper.      No current facility-administered medications for this visit.     Allergies:   Adhesive [tape] and Eliquis [apixaban]   Social History:  The patient  reports that he has quit smoking. His smoking use included cigarettes and cigars.  He has never used smokeless tobacco. He reports current alcohol use. He reports that he does not use drugs.   Family History:  The patient's family history includes Diabetes in his mother; Heart attack in his brother; Heart failure in his mother; Hypertension in his mother.   ROS:  Please see the history of present illness.   All other systems are personally reviewed and negative.    Exam:    Vital Signs:  BP 129/78   Pulse 80   Well sounding, alert and conversant   Labs/Other Tests and Data Reviewed:    Recent Labs: No results found for requested labs within last 8760 hours.   Wt Readings from Last 3 Encounters:  03/08/18 191 lb 9.6 oz (86.9 kg)  12/29/17 196 lb 12.8 oz (89.3 kg)  09/29/17 200 lb (90.7 kg)     ASSESSMENT & PLAN:    1.  Paroxysmal atrial fibrillation Well controlled post ablation off AAD therapy chads2vasc score is 1.  He therefore does not require [place] at this time.  2. HTN Stable No change required today  Follow-up:  12 months   Patient Risk:  after full review of this patients clinical status, I feel that they are at moderate risk at this time.  Today, I have spent 15 minutes with the patient with telehealth technology discussing arrhythmia management .    Army Fossa, MD  03/26/2019 3:39 PM     [address] [place] [address] [phone] (office) [phone] (fax)

## 2019-04-18 ENCOUNTER — Other Ambulatory Visit: Payer: Self-pay | Admitting: Cardiology

## 2019-04-18 DIAGNOSIS — I10 Essential (primary) hypertension: Secondary | ICD-10-CM

## 2019-04-18 DIAGNOSIS — I480 Paroxysmal atrial fibrillation: Secondary | ICD-10-CM

## 2019-05-30 ENCOUNTER — Ambulatory Visit: Payer: 59 | Admitting: Cardiology

## 2019-07-08 ENCOUNTER — Encounter: Payer: Self-pay | Admitting: Physician Assistant

## 2019-07-08 NOTE — Progress Notes (Signed)
Cardiology Office Note  Date: 07/09/2019   ID: [name], [name] August 13, 1965, MRN [id number]  PCP:  Antony Contras, MD  Cardiologist:  Ena Dawley, MD Electrophysiologist:  None   Chief Complaint  Patient presents with  . Follow-up    Atrial fibrillation    History of Present Illness: Evan [name] is a 54 y.o. male who last saw Dr. Rayann Heman via telemedicine for chief complaint of palpitations on March 26, 2019.  History of atrial fibrillation/atrial flutter status post ablation in 2016 and failed medical therapy with dronedarone.  Other past medical history includes hypertension, and mild CAD per cardiac CT 2016 . Patient denies any recent acute illnesses, hospitalizations, surgeries, or travels. Denies any exposure to Covid virus or symptoms.  Has no complaints of palpitations or arrhythmias, stroke or TIA-like symptoms, progressive anginal symptoms or dyspnea. Denies any DVT or PE-like symptoms, or lower extremity edema. He takes Lasix as needed for lower extremity edema. Blood pressure initially on the low side on arrival today. Recheck was 110/64 in right arm.  Patient states he recently had checkup with Dr. Moreen Fowler and had multiple labs drawn. Patient states he requested the lab work be sent here but we do not have those results in our system.    Past Medical History:  Diagnosis Date  . Anxiety   . Atrial fibrillation ([place])    a. PVI 04/2014 Dr Rayann Heman b. failed medical therapy with Multaq  . Atrial flutter ([place])    a. CTI ablation 04/2014 Dr Rayann Heman  . Hypertension   . Lower extremity edema   . Mild CAD    a. minimal nonobstructive disease in RCA by prior CT 2016.  . Mild mitral regurgitation   . Psoriasis   . S/P ablation of atrial fibrillation 12/24/14 12/25/2014    Past Surgical History:  Procedure Laterality Date  . ATRIAL FIBRILLATION ABLATION [name] 05/07/2014   PVI and CTI by Dr Rayann Heman  . CARPAL TUNNEL RELEASE    . ELECTROPHYSIOLOGIC STUDY [name] 12/24/2014   Procedure: Atrial Fibrillation Ablation;  Surgeon: Thompson Grayer, MD;  Location: [place] [facility];  Service: Cardiovascular;  Laterality: [name];  . HERNIA REPAIR Right    with mesh  . loop recorder explant  03/08/2018   MDT LINQ removed for end of battery service by Dr Rayann Heman  . LOOP RECORDER IMPLANT [name] 08/07/2014   Procedure: LOOP RECORDER IMPLANT;  Surgeon: Thompson Grayer, MD;  Location: [facility] CATH LAB;  Service: Cardiovascular;  Laterality: [name];  . TEE WITHOUT CARDIOVERSION [name] 05/06/2014   Procedure: TRANSESOPHAGEAL ECHOCARDIOGRAM (TEE);  Surgeon: Pixie Casino, MD;  Location: [facility] ENDOSCOPY;  Service: Cardiovascular;  Laterality: [name];    Current Outpatient Medications  Medication Sig Dispense Refill  . amLODipine (NORVASC) 2.5 MG tablet Take 2.5 mg by mouth daily.    Marland Kitchen aspirin EC 81 MG tablet Take 1 tablet (81 mg total) by mouth daily. 90 tablet 3  . EYELID CLEANSERS EX Apply 1 application topically 3 (three) times a week.     . Flaxseed, Linseed, (FLAXSEED OIL) 1000 MG CAPS Take 1,000 mg by mouth daily.    . fluocinonide cream (LIDEX) [id number] % Apply 1 application topically daily as needed (psoriasis).   0  . fluticasone (CUTIVATE) 0.05 % cream APPLY TO BETWEEN LEGS TWICE DAILY AS NEEDED    . furosemide (LASIX) 40 MG tablet Take 1 tablet (40 mg total) by mouth daily as needed for fluid (swelling). 90 tablet 1  . LORazepam (ATIVAN)  0.5 MG tablet Take 0.5 mg by mouth daily as needed for anxiety.    . mirtazapine (REMERON) 15 MG tablet 1 tablet at bedtime Once a day Orally 90 day(s)    . Multiple Vitamin (MULTIVITAMIN WITH MINERALS) TABS Take 1 tablet by mouth daily with supper.     . olmesartan (BENICAR) 40 MG tablet Take 40 mg by mouth daily.    . vitamin C (ASCORBIC ACID) 500 MG tablet Take 500 mg by mouth daily.    Marland Kitchen XIIDRA 5 % SOLN Take 1 drop by mouth daily.     No current facility-administered medications for this visit.   Allergies:  Adhesive [tape] and Eliquis [apixaban]   Social  History: The patient  reports that he has quit smoking. His smoking use included cigarettes and cigars. He has never used smokeless tobacco. He reports current alcohol use. He reports that he does not use drugs.   Family History: The patient's family history includes Diabetes in his mother; Heart attack in his brother; Heart failure in his mother; Hypertension in his mother.   ROS:  Please see the history of present illness. Otherwise, complete review of systems is positive for none.  All other systems are reviewed and negative.   Physical Exam: VS:  BP 110/64   Pulse 69   Ht 6\' 2"  (1.88 m)   Wt 187 lb (84.8 kg)   SpO2 97%   BMI 24.01 kg/m , BMI Body mass index is 24.01 kg/m.  Wt Readings from Last 3 Encounters:  07/09/19 187 lb (84.8 kg)  03/08/18 191 lb 9.6 oz (86.9 kg)  12/29/17 196 lb 12.8 oz (89.3 kg)    General: Patient appears comfortable at rest. Neck: Supple, no elevated JVP or carotid bruits, no thyromegaly. Lungs: Clear to auscultation, nonlabored breathing at rest. Cardiac: Regular rate and rhythm, no S3 or significant systolic murmur, no pericardial rub. Extremities: No pitting edema, distal pulses 2+. Skin: Warm and dry. Neuropsychiatric: Alert and oriented x3, affect grossly appropriate.  ECG:  An ECG dated July 09, 2019. was personally reviewed today and demonstrated:  Normal sinus rhythm rate of 69. Tall T waves noted but no acute ST or other T wave changes noted. Stable compared to prior.  Recent Labwork: No results found for requested labs within last 8760 hours.     Component Value Date/Time   CHOL 148 10/24/2014 1038   TRIG 73.0 10/24/2014 1038   HDL 56.00 10/24/2014 1038   CHOLHDL 3 10/24/2014 1038   VLDL 14.6 10/24/2014 1038   LDLCALC 77 10/24/2014 1038    Other Studies Reviewed Today:   Coronary CTA with calcium score December 08, 2014 IMPRESSION: 1. Coronary calcium score of 0. This was 0 percentile for age and sex matched control. 2. Normal  coronary origin. Right dominance. Minimal non-obstructive CAD in the proximal RCA. 3. There is normal pulmonary vein drainage into the left atrium. There are two right and two left pulmonary veins with no evidence of stenosis. Esophagus runs posteriorly and adjacent to the ostia of the LUPV and LLPV. 4. A large left atrial appendage has no filling defect. 5. Normal biatrial size.  Assessment and Plan:  1. Paroxysmal atrial fibrillation (HCC)   2. Essential hypertension   3. Lower extremity edema   4. Mild CAD    1. Paroxysmal atrial fibrillation (HCC) History of atrial fibrillation with last RF ablation performed in 2016. Patient has remained in normal sinus rhythm since that time. EKG today shows normal sinus  rhythm with a rate of 69, no ST or T wave changes other than tall T Waves. Continue aspirin 81 mg daily. He is not on anticoagulation d/t CHADSVASc Score of 1.  2. Essential hypertension Blood pressure within normal limits today. Initial blood pressure on arrival was 100/66. Repeat blood pressure in right arm 110/64. Continue amlodipine 2.5 mg daily. Continue olmesartan 40 mg daily. Tolerating regimen well. If he should develop persistently lower BP in the future, would discontinue amlodipine.  3.LE edema Patient takes Lasix 40 mg as needed for lower extremity edema. Continue Lasix as needed for lower extremity edema. No lower extremity edema noted today on exam. We will review labs when we get them from primary care.  4. Mild CAD No recent anginal symptoms. Continue aspirin. He does not appear to be on a statin. Lipids are managed in primary care. We will obtain records. Would suggest goal LDL <70.  Medication Adjustments/Labs and Tests Ordered: Current medicines are reviewed at length with the patient today.  Concerns regarding medicines are outlined above.    Patient Instructions  Medication Instructions:  Your physician recommends that you continue on your current  medications as directed. Please refer to the Current Medication list given to you today.   *If you need a refill on your cardiac medications before your next appointment, please call your pharmacy*  Lab Work: None ordered   If you have labs (blood work) drawn today and your tests are completely normal, you will receive your results only by: Marland Kitchen MyChart Message (if you have MyChart) OR . A paper copy in the mail If you have any lab test that is abnormal or we need to change your treatment, we will call you to review the results.  Testing/Procedures: None ordered   Follow-Up: At [facility], you and your health needs are our priority.  As part of our continuing mission to provide you with exceptional heart care, we have created designated Provider Care Teams.  These Care Teams include your primary Cardiologist (physician) and Advanced Practice Providers (APPs -  Physician Assistants and Nurse Practitioners) who all work together to provide you with the care you need, when you need it.  Your next appointment:   6 month(s)  The format for your next appointment:   In Person  Provider:   You may see Ena Dawley, MD or one of the following Advanced Practice Providers on your designated Care Team:    Melina Copa, PA-C  Ermalinda Barrios, PA-C  [place]   Other Instructions None ordered          Signed, Levell July, NP 07/09/2019 5:23 PM    [facility]

## 2019-07-09 ENCOUNTER — Ambulatory Visit: Payer: 59 | Admitting: Family Medicine

## 2019-07-09 ENCOUNTER — Encounter: Payer: Self-pay | Admitting: Family Medicine

## 2019-07-09 ENCOUNTER — Other Ambulatory Visit: Payer: Self-pay

## 2019-07-09 VITALS — BP 110/64 | HR 69 | Ht 74.0 in | Wt 187.0 lb

## 2019-07-09 DIAGNOSIS — I480 Paroxysmal atrial fibrillation: Secondary | ICD-10-CM

## 2019-07-09 DIAGNOSIS — I10 Essential (primary) hypertension: Secondary | ICD-10-CM

## 2019-07-09 DIAGNOSIS — I2510 Atherosclerotic heart disease of native coronary artery without angina pectoris: Secondary | ICD-10-CM | POA: Diagnosis not present

## 2019-07-09 DIAGNOSIS — R600 Localized edema: Secondary | ICD-10-CM

## 2019-07-09 NOTE — Patient Instructions (Addendum)
Medication Instructions:  Your physician recommends that you continue on your current medications as directed. Please refer to the Current Medication list given to you today.   *If you need a refill on your cardiac medications before your next appointment, please call your pharmacy*  Lab Work: None ordered   If you have labs (blood work) drawn today and your tests are completely normal, you will receive your results only by: Marland Kitchen MyChart Message (if you have MyChart) OR . A paper copy in the mail If you have any lab test that is abnormal or we need to change your treatment, we will call you to review the results.  Testing/Procedures: None ordered   Follow-Up: At [facility], you and your health needs are our priority.  As part of our continuing mission to provide you with exceptional heart care, we have created designated Provider Care Teams.  These Care Teams include your primary Cardiologist (physician) and Advanced Practice Providers (APPs -  Physician Assistants and Nurse Practitioners) who all work together to provide you with the care you need, when you need it.  Your next appointment:   6 month(s)  The format for your next appointment:   In Person  Provider:   You may see Ena Dawley, MD or one of the following Advanced Practice Providers on your designated Care Team:    Melina Copa, PA-C  Ermalinda Barrios, PA-C  CALL OUR OFFICE THE FIRST WEEK IN MAY TO SCHEDULE YOUR APPOINTMENT   Other Instructions None ordered

## 2020-05-14 ENCOUNTER — Ambulatory Visit (INDEPENDENT_AMBULATORY_CARE_PROVIDER_SITE_OTHER): Payer: 59 | Admitting: Cardiology

## 2020-05-14 ENCOUNTER — Other Ambulatory Visit: Payer: Self-pay

## 2020-05-14 ENCOUNTER — Encounter: Payer: Self-pay | Admitting: Cardiology

## 2020-05-14 VITALS — BP 120/78 | HR 69 | Ht 74.0 in | Wt 204.2 lb

## 2020-05-14 DIAGNOSIS — I480 Paroxysmal atrial fibrillation: Secondary | ICD-10-CM

## 2020-05-14 DIAGNOSIS — I10 Essential (primary) hypertension: Secondary | ICD-10-CM | POA: Diagnosis not present

## 2020-05-14 DIAGNOSIS — R600 Localized edema: Secondary | ICD-10-CM

## 2020-05-14 NOTE — Progress Notes (Signed)
Cardiology Office Note:    Date:  05/14/2020   ID:  Evan [name], DOB 1966-04-17, MRN [id number]  PCP:  Antony Contras, MD  [facility] Cardiologist:  Ena Dawley, MD  [facility] Electrophysiologist: Dr. Thompson Grayer  Referring MD: Antony Contras, MD   Reason for visit: 1 year follow-up  History of Present Illness:    Evan [name] is a 54 y.o. male with a hx of paroxysmal atrial fibrillation, status post ablation in 2015 by Dr. Rayann Heman with no recurrent episodes followed on loop recorder, currently off anticoagulation as he is chads2vasc score is 1.    Currently on aspirin only, he was previously evaluated for chest pain and underwent coronary CTA that showed calcium score of 0 and no evidence for coronary artery disease.  He has retired and is working part-time in [employer], he is very active works out several times a week and has no symptoms of chest pain or shortness of breath, no palpitation no dizziness or syncope.  No lower extremity edema.  His blood pressure has been well controlled.   Past Medical History:  Diagnosis Date  . Anxiety   . Atrial fibrillation ([place])    a. PVI 04/2014 Dr Rayann Heman b. failed medical therapy with Multaq  . Atrial flutter ([place])    a. CTI ablation 04/2014 Dr Rayann Heman  . Hypertension   . Lower extremity edema   . Mild CAD    a. minimal nonobstructive disease in RCA by prior CT 2016.  . Mild mitral regurgitation   . Psoriasis   . S/P ablation of atrial fibrillation 12/24/14 12/25/2014    Past Surgical History:  Procedure Laterality Date  . ATRIAL FIBRILLATION ABLATION [name] 05/07/2014   PVI and CTI by Dr Rayann Heman  . CARPAL TUNNEL RELEASE    . ELECTROPHYSIOLOGIC STUDY [name] 12/24/2014   Procedure: Atrial Fibrillation Ablation;  Surgeon: Thompson Grayer, MD;  Location: [place] CV LAB;  Service: Cardiovascular;  Laterality: [name];  . HERNIA REPAIR Right    with mesh  . loop recorder explant  03/08/2018   MDT LINQ removed for end of battery  service by Dr Rayann Heman  . LOOP RECORDER IMPLANT [name] 08/07/2014   Procedure: LOOP RECORDER IMPLANT;  Surgeon: Thompson Grayer, MD;  Location: [facility] CATH LAB;  Service: Cardiovascular;  Laterality: [name];  . TEE WITHOUT CARDIOVERSION [name] 05/06/2014   Procedure: TRANSESOPHAGEAL ECHOCARDIOGRAM (TEE);  Surgeon: Pixie Casino, MD;  Location: [facility] ENDOSCOPY;  Service: Cardiovascular;  Laterality: [name];    Current Medications: Current Meds  Medication Sig  . amLODipine (NORVASC) 2.5 MG tablet Take 2.5 mg by mouth daily.  Marland Kitchen aspirin EC 81 MG tablet Take 1 tablet (81 mg total) by mouth daily.  Marland Kitchen doxycycline (VIBRAMYCIN) 50 MG capsule Take 50 mg by mouth daily.  Marland Kitchen EYELID CLEANSERS EX Apply 1 application topically 3 (three) times a week.   . Flaxseed, Linseed, (FLAXSEED OIL) 1000 MG CAPS Take 1,000 mg by mouth daily.  . fluocinonide cream (LIDEX) 8.46 % Apply 1 application topically daily as needed (psoriasis).   . fluticasone (CUTIVATE) 0.05 % cream APPLY TO BETWEEN LEGS TWICE DAILY AS NEEDED  . furosemide (LASIX) 40 MG tablet Take 1 tablet (40 mg total) by mouth daily as needed for fluid (swelling).  . LORazepam (ATIVAN) 0.5 MG tablet Take 0.5 mg by mouth daily as needed for anxiety.  . mirtazapine (REMERON) 15 MG tablet 1 tablet at bedtime Once a day Orally 90 day(s)  . Multiple Vitamin (  MULTIVITAMIN WITH MINERALS) TABS Take 1 tablet by mouth daily with supper.   . olmesartan (BENICAR) 40 MG tablet Take 40 mg by mouth daily.  . vitamin C (ASCORBIC ACID) 500 MG tablet Take 500 mg by mouth daily.  Marland Kitchen XIIDRA 5 % SOLN Take 1 drop by mouth daily.     Allergies:   Adhesive [tape] and Eliquis [apixaban]   Social History   Socioeconomic History  . Marital status: Legally Separated    Spouse name: Not on file  . Number of children: Not on file  . Years of education: Not on file  . Highest education level: Not on file  Occupational History  . Not on file  Tobacco Use  . Smoking status: Former Smoker    Types:  Cigarettes, Cigars  . Smokeless tobacco: Never Used  Vaping Use  . Vaping Use: Never used  Substance and Sexual Activity  . Alcohol use: Yes    Comment: wine at night to help relax  . Drug use: No  . Sexual activity: Not on file  Other Topics Concern  . Not on file  Social History Narrative  . Not on file   Social Determinants of Health   Financial Resource Strain: Not on file  Food Insecurity: Not on file  Transportation Needs: Not on file  Physical Activity: Not on file  Stress: Not on file  Social Connections: Not on file     Family History: The patient's family history includes Diabetes in his mother; Heart attack in his brother; Heart failure in his mother; Hypertension in his mother.  ROS:   Please see the history of present illness.    All other systems reviewed and are negative.  EKGs/Labs/Other Studies Reviewed:    The following studies were reviewed today:  EKG:  EKG is  ordered today.  The ekg ordered today demonstrates normal sinus rhythm, normal EKG unchanged from prior, this was personally reviewed.  Recent Labs: No results found for requested labs within last 8760 hours.  Recent Lipid Panel    Component Value Date/Time   CHOL 148 10/24/2014 1038   TRIG 73.0 10/24/2014 1038   HDL 56.00 10/24/2014 1038   CHOLHDL 3 10/24/2014 1038   VLDL 14.6 10/24/2014 1038   LDLCALC 77 10/24/2014 1038   Physical Exam:    VS:  BP 120/78   Pulse 69   Ht 6\' 2"  (1.88 m)   Wt 204 lb 3.2 oz (92.6 kg)   SpO2 97%   BMI 26.22 kg/m     Wt Readings from Last 3 Encounters:  05/14/20 204 lb 3.2 oz (92.6 kg)  07/09/19 187 lb (84.8 kg)  03/08/18 191 lb 9.6 oz (86.9 kg)     GEN:  Well nourished, well developed in no acute distress HEENT: Normal NECK: No JVD; No carotid bruits LYMPHATICS: No lymphadenopathy CARDIAC: RRR, no murmurs, rubs, gallops RESPIRATORY:  Clear to auscultation without rales, wheezing or rhonchi  ABDOMEN: Soft, non-tender,  non-distended MUSCULOSKELETAL:  No edema; No deformity  SKIN: Warm and dry NEUROLOGIC:  Alert and oriented x 3 PSYCHIATRIC:  Normal affect   ASSESSMENT:    1. Paroxysmal atrial fibrillation (HCC)   2. Primary hypertension   3. Lower extremity edema    PLAN:    In order of problems listed above:  1. Paroxysmal atrial fibrillation ([place]) Post radiofrequency ablation in 2015, no recurrent episodes on loop recorder that has been explanted, he is not on anticoagulation d/t CHADSVASc Score of 1.  He  is on aspirin only.  2. Essential hypertension Well-controlled.  3.LE edema This has resolved with Lasix as needed, no recent episode.  4.  Lipids -all at goal with no therapy.  Medication Adjustments/Labs and Tests Ordered: Current medicines are reviewed at length with the patient today.  Concerns regarding medicines are outlined above.  Orders Placed This Encounter  Procedures  . EKG 12-Lead   No orders of the defined types were placed in this encounter.   Patient Instructions  Medication Instructions:   Your physician recommends that you continue on your current medications as directed. Please refer to the Current Medication list given to you today.  *If you need a refill on your cardiac medications before your next appointment, please call your pharmacy*   Follow-Up: At [facility], you and your health needs are our priority.  As part of our continuing mission to provide you with exceptional heart care, we have created designated Provider Care Teams.  These Care Teams include your primary Cardiologist (physician) and Advanced Practice Providers (APPs -  Physician Assistants and Nurse Practitioners) who all work together to provide you with the care you need, when you need it.  We recommend signing up for the patient portal called "MyChart".  Sign up information is provided on this After Visit Summary.  MyChart is used to connect with patients for Virtual Visits  (Telemedicine).  Patients are able to view lab/test results, encounter notes, upcoming appointments, etc.  Non-urgent messages can be sent to your provider as well.   To learn more about what you can do with MyChart, go to [email or web address].    Your next appointment:   6 month(s)  The format for your next appointment:   In Person  Provider:   Ena Dawley, MD        Signed, Ena Dawley, MD  05/14/2020 4:38 PM    [place]

## 2020-05-14 NOTE — Patient Instructions (Signed)

## 2020-06-25 ENCOUNTER — Other Ambulatory Visit: Payer: Self-pay

## 2020-06-25 ENCOUNTER — Ambulatory Visit: Payer: 59 | Admitting: Internal Medicine

## 2020-06-25 ENCOUNTER — Encounter: Payer: Self-pay | Admitting: Internal Medicine

## 2020-06-25 VITALS — BP 100/60 | HR 67 | Ht 74.0 in | Wt 209.0 lb

## 2020-06-25 DIAGNOSIS — I10 Essential (primary) hypertension: Secondary | ICD-10-CM

## 2020-06-25 DIAGNOSIS — I480 Paroxysmal atrial fibrillation: Secondary | ICD-10-CM | POA: Diagnosis not present

## 2020-06-25 MED ORDER — AMLODIPINE BESYLATE 2.5 MG PO TABS: 2.5000 mg | ORAL_TABLET | Freq: Every day | ORAL | 3 refills | Status: DC | PRN

## 2020-06-25 NOTE — Progress Notes (Signed)
PCP: Antony Contras, MD Primary Cardiologist: Dr Meda Coffee Primary EP: Dr Derenda Fennel is a 55 y.o. male who presents today for routine electrophysiology followup.  Since last being seen in our clinic, the patient reports doing very well.  Today, he denies symptoms of palpitations, chest pain, shortness of breath,  lower extremity edema, dizziness, presyncope, or syncope.  The patient is otherwise without complaint today.   Past Medical History:  Diagnosis Date  . Anxiety   . Atrial fibrillation ([place])    a. PVI 04/2014 Dr Rayann Heman b. failed medical therapy with Multaq  . Atrial flutter ([place])    a. CTI ablation 04/2014 Dr Rayann Heman  . Hypertension   . Lower extremity edema   . Mild CAD    a. minimal nonobstructive disease in RCA by prior CT 2016.  . Mild mitral regurgitation   . Psoriasis   . S/P ablation of atrial fibrillation 12/24/14 12/25/2014   Past Surgical History:  Procedure Laterality Date  . ATRIAL FIBRILLATION ABLATION [name] 05/07/2014   PVI and CTI by Dr Rayann Heman  . CARPAL TUNNEL RELEASE    . ELECTROPHYSIOLOGIC STUDY [name] 12/24/2014   Procedure: Atrial Fibrillation Ablation;  Surgeon: Thompson Grayer, MD;  Location: [place] CV LAB;  Service: Cardiovascular;  Laterality: [name];  . HERNIA REPAIR Right    with mesh  . loop recorder explant  03/08/2018   MDT LINQ removed for end of battery service by Dr Rayann Heman  . LOOP RECORDER IMPLANT [name] 08/07/2014   Procedure: LOOP RECORDER IMPLANT;  Surgeon: Thompson Grayer, MD;  [facility] C[facility] LAB;  Service: Cardiovascular;  Laterality: [name];  . TEE WITHOUT CARDIOVERSION [name] 05/06/2014   Procedure: TRANSESOPHAGEAL ECHOCARDIOGRAM (TEE);  Surgeon: Pixie Casino, MD;  Location: [facility] ENDOSCOPY;  Service: Cardiovascular;  Laterality: [name];    ROS- all systems are reviewed and negatives except as per HPI above  Current Outpatient Medications  Medication Sig Dispense Refill  . amLODipine (NORVASC) 2.5 MG tablet Take 2.5 mg by mouth daily.    Marland Kitchen  aspirin EC 81 MG tablet Take 1 tablet (81 mg total) by mouth daily. 90 tablet 3  . doxycycline (VIBRAMYCIN) 50 MG capsule Take 50 mg by mouth daily.    Marland Kitchen EYELID CLEANSERS EX Apply 1 application topically 3 (three) times a week.     . Flaxseed, Linseed, (FLAXSEED OIL) 1000 MG CAPS Take 1,000 mg by mouth daily.    . fluocinonide cream (LIDEX) 7.62 % Apply 1 application topically daily as needed (psoriasis).   0  . fluticasone (CUTIVATE) 0.05 % cream APPLY TO BETWEEN LEGS TWICE DAILY AS NEEDED    . furosemide (LASIX) 40 MG tablet Take 1 tablet (40 mg total) by mouth daily as needed for fluid (swelling). 90 tablet 1  . LORazepam (ATIVAN) 0.5 MG tablet Take 0.5 mg by mouth daily as needed for anxiety.    . mirtazapine (REMERON) 15 MG tablet 1 tablet at bedtime Once a day Orally 90 day(s)    . Multiple Vitamin (MULTIVITAMIN WITH MINERALS) TABS Take 1 tablet by mouth daily with supper.     . olmesartan (BENICAR) 40 MG tablet Take 40 mg by mouth daily.    . Varenicline Tartrate (TYRVAYA) 0.03 MG/ACT SOLN Place 1 spray into the nose in the morning and at bedtime.    . vitamin C (ASCORBIC ACID) 500 MG tablet Take 500 mg by mouth daily.    Marland Kitchen XIIDRA 5 % SOLN Take 1 drop by mouth daily.  No current facility-administered medications for this visit.    Physical Exam: Vitals:   06/25/20 1618  BP: 100/60  Pulse: 67  SpO2: 99%  Weight: 209 lb (94.8 kg)  Height: 6\' 2"  (1.88 m)    GEN- The patient is well appearing, alert and oriented x 3 today.   Head- normocephalic, atraumatic Eyes-  Sclera clear, conjunctiva pink Ears- hearing intact Oropharynx- clear Lungs- Clear to ausculation bilaterally, normal work of breathing Heart- Regular rate and rhythm, no murmurs, rubs or gallops, PMI not laterally displaced GI- soft, NT, ND, + BS Extremities- no clubbing, cyanosis, or edema  Wt Readings from Last 3 Encounters:  06/25/20 209 lb (94.8 kg)  05/14/20 204 lb 3.2 oz (92.6 kg)  07/09/19 187 lb (84.8  kg)    EKG tracing ordered today is personally reviewed and shows sinus  Assessment and Plan:  1. Paroxysmal atrial fibrillation Doing well post ablation off AAD therapy chads2vasc score is 1.  Does not require [place] at this time Stop ASA  2. HTN bp has been low recently He has previously had swelling with norvasc, requiring lasix Stop lasix today   Risks, benefits and potential toxicities for medications prescribed and/or refilled reviewed with patient today.   Return to see EP PA in a year  Thompson Grayer MD, [facility] 06/25/2020 4:21 PM

## 2020-06-25 NOTE — Patient Instructions (Addendum)
Medication Instructions:  Stop Aspirin Change your Amlodipine to as needed daily. Take IF your top number is greater than 150 or your bottom number is greater than 90.  Your physician recommends that you continue on your current medications as directed. Please refer to the Current Medication list given to you today.  Labwork: None ordered.  Testing/Procedures: None ordered.  Follow-Up: Your physician wants you to follow-up in: one year with Tommye Standard, PA-C   You will receive a reminder letter in the mail two months in advance. If you don't receive a letter, please call our office to schedule the follow-up appointment.   Any Other Special Instructions Will Be Listed Below (If Applicable).  If you need a refill on your cardiac medications before your next appointment, please call your pharmacy.

## 2021-03-10 ENCOUNTER — Ambulatory Visit: Payer: 59 | Admitting: Physician Assistant

## 2021-03-10 ENCOUNTER — Encounter: Payer: Self-pay | Admitting: Physician Assistant

## 2021-03-10 ENCOUNTER — Other Ambulatory Visit: Payer: Self-pay

## 2021-03-10 VITALS — BP 118/72 | HR 74 | Ht 74.0 in | Wt 188.4 lb

## 2021-03-10 DIAGNOSIS — I480 Paroxysmal atrial fibrillation: Secondary | ICD-10-CM

## 2021-03-10 DIAGNOSIS — I10 Essential (primary) hypertension: Secondary | ICD-10-CM | POA: Diagnosis not present

## 2021-03-10 DIAGNOSIS — E7800 Pure hypercholesterolemia, unspecified: Secondary | ICD-10-CM | POA: Diagnosis not present

## 2021-03-10 NOTE — Progress Notes (Signed)
Cardiology Office Note:    Date:  03/10/2021   ID:  Algis Liming, DOB December 23, 1965, MRN [id number]  PCP:  Antony Contras, MD   [facility] Providers Cardiologist:  Freada Bergeron, MD Cardiology APP:  Sharmon Revere     Referring MD: Antony Contras, MD   Chief Complaint:  F/u for AFib     Patient Profile:   Evan [name] is a 55 y.o. male with:  Paroxysmal atrial fibrillation  S/p PVI ablation in 2015 (Dr. Rayann Heman) Off anticoagulation  Atrial flutter s/p CTI ablation in 2016 Hypertension  Coronary Ca2+ (CT in 7/16 - min plaque in pRCA; CAC score 0)  Prior CV studies: Coronary CTA 12/16/2014 IMPRESSION: 1. Coronary calcium score of 0. This was 0 percentile for age and sex matched control.  2. Normal coronary origin. Right dominance. Minimal non-obstructive CAD in the proximal RCA.  3. There is normal pulmonary vein drainage into the left atrium. There are two right and two left pulmonary veins with no evidence of stenosis.  Esophagus runs posteriorly and adjacent to the ostia of the LUPV and LLPV.  4. A large left atrial appendage has no filling defect.  5. Normal biatrial size. 6.  Hepatic steatosis  Echocardiogram 05/08/2014 EF 55-60, no RWMA, mild MR, normal RVSF, PASP 28  History of Present Illness: Evan [name] was last seen by Dr. Meda Coffee in 12/21.   He saw Dr. Rayann Heman in 1/22. He returns for f/u.  He denies chest pain, shortness of breath, syncope, orthopnea, PND or significant pedal edema.  He has not had palpitations.     Past Medical History:  Diagnosis Date   Anxiety    Atrial fibrillation ([place])    a. PVI 04/2014 Dr Rayann Heman b. failed medical therapy with Multaq   Atrial flutter [facility])    a. CTI ablation 04/2014 Dr Rayann Heman   Hypertension    Lower extremity edema    Mild CAD    a. minimal nonobstructive disease in RCA by prior CT 2016.   Mild mitral regurgitation    Psoriasis    S/P ablation of atrial fibrillation 12/24/14 12/25/2014   Current  Medications: Current Meds  Medication Sig   amLODipine (NORVASC) 2.5 MG tablet Take 1 tablet (2.5 mg total) by mouth daily as needed (high blood pressure). IF top number is greater than 150 or bottom number is greater than 90.   EYELID CLEANSERS EX Apply 1 application topically 3 (three) times a week.    fluocinonide cream (LIDEX) 7.89 % Apply 1 application topically daily as needed (psoriasis).    fluticasone (CUTIVATE) 0.05 % cream Apply 1 application topically as needed (for skin irritation).   furosemide (LASIX) 40 MG tablet Take 1 tablet (40 mg total) by mouth daily as needed for fluid (swelling).   LORazepam (ATIVAN) 0.5 MG tablet Take 0.5 mg by mouth daily as needed for anxiety.   mirtazapine (REMERON) 15 MG tablet Take 15 mg by mouth daily.   Multiple Vitamin (MULTIVITAMIN WITH MINERALS) TABS Take 1 tablet by mouth daily with supper.    olmesartan (BENICAR) 40 MG tablet Take 40 mg by mouth daily.   vitamin C (ASCORBIC ACID) 500 MG tablet Take 500 mg by mouth daily.   XIIDRA 5 % SOLN Place 1 drop into both eyes 2 (two) times daily.    Allergies:   Adhesive [tape] and Eliquis [apixaban]   Social History   Tobacco Use   Smoking status: Former    Types: Cigarettes,  Cigars   Smokeless tobacco: Never  Vaping Use   Vaping Use: Never used  Substance Use Topics   Alcohol use: Yes    Comment: wine at night to help relax   Drug use: No    Family Hx: The patient's family history includes Diabetes in his mother; Heart attack in his brother; Heart failure in his mother; Hypertension in his mother.  Review of Systems  Cardiovascular:  Negative for claudication.    EKGs/Labs/Other Test Reviewed:    EKG:  EKG is not ordered today.  The ekg ordered today demonstrates n/a  Recent Labs: No results found for requested labs within last 8760 hours.   Recent Lipid Panel Lab Results  Component Value Date/Time   CHOL 148 10/24/2014 10:38 AM   TRIG 73.0 10/24/2014 10:38 AM   HDL 56.00  10/24/2014 10:38 AM   LDLCALC 77 10/24/2014 10:38 AM     Risk Assessment/Calculations:    CHA2DS2-VASc Score = 1   This indicates a 0.6% annual risk of stroke. The patient's score is based upon: CHF History: 0 HTN History: 1 Diabetes History: 0 Stroke History: 0 Vascular Disease History: 0 Age Score: 0 Gender Score: 0         Physical Exam:    VS:  BP 118/72   Pulse 74   Ht 6\' 2"  (1.88 m)   Wt 188 lb 6.4 oz (85.5 kg)   SpO2 98%   BMI 24.19 kg/m     Wt Readings from Last 3 Encounters:  03/10/21 188 lb 6.4 oz (85.5 kg)  06/25/20 209 lb (94.8 kg)  05/14/20 204 lb 3.2 oz (92.6 kg)    Constitutional:      Appearance: Healthy appearance. Not in distress.  Neck:     Thyroid: No thyromegaly.     Vascular: No carotid bruit. JVD normal.  Pulmonary:     Effort: Pulmonary effort is normal.     Breath sounds: No wheezing. No rales.  Cardiovascular:     Normal rate. Regular rhythm. Normal S1. Normal S2.      Murmurs: There is no murmur.  Edema:    Peripheral edema absent.  Abdominal:     Palpations: Abdomen is soft. There is no hepatomegaly.  Skin:    General: Skin is warm and dry.  Neurological:     General: No focal deficit present.     Mental Status: Alert and oriented to person, place and time.     Cranial Nerves: Cranial nerves are intact.     ASSESSMENT & PLAN:   1. Paroxysmal atrial fibrillation (HCC) Maintaining normal sinus rhythm after PVI ablation in 2015.  CHA2DS2-VASc Score = 1 [CHF History: 0, HTN History: 1, Diabetes History: 0, Stroke History: 0, Vascular Disease History: 0, Age Score: 0, Gender Score: 0].  Therefore, the patient's annual risk of stroke is 0.6 %.  He has been off of anticoagulation for years.  F/u with EP as planned.   2. Essential hypertension BP is well controlled on Olmesartan.    3. Pure hypercholesterolemia LDL in 2/22 was 111 ([employer]).  He had minimal non-calcific plaque in the pRCA by CT in 2016.  I recommend keeping his LDL  less than 100 and as close to 70 as possible.  I have asked him to work on his diet.  If his LDL at repeat check in 2023 is still above 100, consider low dose statin Rx.      Dispo:  Return in about 1 year (around  03/10/2022) for Routine follow up in 1 year with Dr. Johney Frame..  Pt was previously seen by Dr. Meda Coffee for gen cardiology.  He can f/u with Dr. Johney Frame and me going forward.    Medication Adjustments/Labs and Tests Ordered: Current medicines are reviewed at length with the patient today.  Concerns regarding medicines are outlined above.  Tests Ordered: No orders of the defined types were placed in this encounter.  Medication Changes: No orders of the defined types were placed in this encounter.  Signed, Richardson Dopp, PA-C  03/10/2021 4:37 PM    [facility] [place], [place], [place]  [address] Phone: [phone]; Fax: [phone]

## 2021-03-10 NOTE — Patient Instructions (Signed)
Medication Instructions:   Your physician recommends that you continue on your current medications as directed. Please refer to the Current Medication list given to you today.  *If you need a refill on your cardiac medications before your next appointment, please call your pharmacy*   Lab Work:  -NONE  If you have labs (blood work) drawn today and your tests are completely normal, you will receive your results only by: [place] (if you have MyChart) OR A paper copy in the mail If you have any lab test that is abnormal or we need to change your treatment, we will call you to review the results.   Testing/Procedures:  -NONE   Follow-Up: At [facility], you and your health needs are our priority.  As part of our continuing mission to provide you with exceptional heart care, we have created designated Provider Care Teams.  These Care Teams include your primary Cardiologist (physician) and Advanced Practice Providers (APPs -  Physician Assistants and Nurse Practitioners) who all work together to provide you with the care you need, when you need it.  We recommend signing up for the patient portal called "MyChart".  Sign up information is provided on this After Visit Summary.  MyChart is used to connect with patients for Virtual Visits (Telemedicine).  Patients are able to view lab/test results, encounter notes, upcoming appointments, etc.  Non-urgent messages can be sent to your provider as well.   To learn more about what you can do with MyChart, go to [email or web address].    Your next appointment:   1 year(s)  The format for your next appointment:   In Person  Provider:   Gwyndolyn Kaufman, MD   Other Instructions  Your physician wants you to follow-up in: 1 year with Dr. Johney Frame.  You will receive a reminder letter in the mail two months in advance. If you don't receive a letter, please call our office to schedule the follow-up appointment.  Please keep your  scheduled EP visit in February.

## 2021-06-26 ENCOUNTER — Other Ambulatory Visit: Payer: Self-pay | Admitting: Internal Medicine

## 2021-07-06 NOTE — Progress Notes (Signed)
Cardiology Office Note Date:  07/09/2021  Patient ID:  Evan, [name] 04/02/1966, MRN [id number] PCP:  Antony Contras, MD  Cardiologist: Dr. Meda Coffee >> Pending plans for Dr. Johney Frame Electrophysiologist: Dr. Rayann Heman    Chief Complaint:  annual visit  History of Present Illness: Evan [name] is a 56 y.o. male with history of HTN, AFib  She comes in today to be seen for Dr. Rayann Heman, last seen by him jan 2022, was doing well, no symptoms of AFib, with low risk score, not on a/c.  More recently he saw Kathleen Argue, PA-C 03/10/21, again, doing well, no changes were made, planned to establish with Dr. Johney Frame.  TODAY He is doing quite well.   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 Multaq is mentioned as an early AAD, caused fatigue PVI ablation 05/07/2014 Sotalol started March 2016 discontinued June 2016 PVI/CTI ablation  12/04/2014 Flecainide stopped Oct 2016   Past Medical History:  Diagnosis Date   Anxiety    Atrial fibrillation ([place])    a. PVI 04/2014 Dr Rayann Heman b. failed medical therapy with Multaq   Atrial flutter [facility])    a. CTI ablation 04/2014 Dr Rayann Heman   Hypertension    Lower extremity edema    Mild CAD    a. minimal nonobstructive disease in RCA by prior CT 2016.   Mild mitral regurgitation    Psoriasis    S/P ablation of atrial fibrillation 12/24/14 12/25/2014    Past Surgical History:  Procedure Laterality Date   ATRIAL FIBRILLATION ABLATION [name] 05/07/2014   PVI and CTI by Dr Rayann Heman   CARPAL TUNNEL RELEASE     ELECTROPHYSIOLOGIC STUDY [name] 12/24/2014   Procedure: Atrial Fibrillation Ablation;  Surgeon: Thompson Grayer, MD;  Location: [facility];  Service: Cardiovascular;  Laterality: [name];   HERNIA REPAIR Right    with mesh   loop recorder  explant  03/08/2018   MDT LINQ removed for end of battery service by Dr Rayann Heman   LOOP RECORDER IMPLANT [name] 08/07/2014   Procedure: LOOP RECORDER IMPLANT;  Surgeon: Thompson Grayer, MD;  Location: [facility] CATH LAB;  Service: Cardiovascular;  Laterality: [name];   TEE WITHOUT CARDIOVERSION [name] 05/06/2014   Procedure: TRANSESOPHAGEAL ECHOCARDIOGRAM (TEE);  Surgeon: Pixie Casino, MD;  Location: [facility] ENDOSCOPY;  Service: Cardiovascular;  Laterality: [name];    Current Outpatient Medications  Medication Sig Dispense Refill   amLODipine (NORVASC) 2.5 MG tablet Take 1 tablet (2.5 mg total) by mouth daily as needed (high blood pressure). IF top number is greater than 150 or bottom number is greater than 90. 30 tablet 3   EYELID CLEANSERS EX Apply 1 application topically 3 (three) times a week.      fluocinonide cream (LIDEX) 5.63 % Apply 1 application topically daily as needed (psoriasis).   0   fluticasone (CUTIVATE) 0.05 % cream Apply 1 application topically as needed (for skin irritation).     furosemide (LASIX) 40 MG tablet Take 1 tablet (40 mg total) by mouth daily as needed for fluid (swelling). 90 tablet 1   LORazepam (ATIVAN) 0.5 MG tablet Take 0.5 mg by mouth daily as needed for anxiety.     mirtazapine (REMERON) 15 MG tablet Take 15 mg by mouth  daily.     Multiple Vitamin (MULTIVITAMIN WITH MINERALS) TABS Take 1 tablet by mouth daily with supper.      olmesartan (BENICAR) 40 MG tablet Take 40 mg by mouth daily.     vitamin C (ASCORBIC ACID) 500 MG tablet Take 500 mg by mouth daily.     XIIDRA 5 % SOLN Place 1 drop into both eyes 2 (two) times daily.     No current facility-administered medications for this visit.    Allergies:   Adhesive [tape] and Eliquis [apixaban]   Social History:  The patient  reports that he has quit smoking. His smoking use included cigarettes and cigars. He has never used smokeless tobacco. He reports current alcohol use. He reports that he does not use drugs.   Family  History:  The patient's family history includes Diabetes in his mother; Heart attack in his brother; Heart failure in his mother; Hypertension in his mother.  ROS:  Please see the history of present illness.    All other systems are reviewed and otherwise negative.   PHYSICAL EXAM:  VS:  BP 120/82    Pulse 66    Ht 6\' 2"  (1.88 m)    Wt 185 lb 9.6 oz (84.2 kg)    SpO2 98%    BMI 23.83 kg/m  BMI: Body mass index is 23.83 kg/m. Well nourished, well developed, in no acute distress HEENT: normocephalic, atraumatic Neck: no JVD, carotid bruits or masses Cardiac:  RRR; no significant murmurs, no rubs, or gallops Lungs:  CTA b/l, no wheezing, rhonchi or rales Abd: soft, nontender MS: no deformity or atrophy Ext: no edema Skin: warm and dry, no rash Neuro:  No gross deficits appreciated Psych: euthymic mood, full affect   EKG:  Done today and reviewed by myself shows  SR 66bpm  12/24/2014: EPS/Ablation CONCLUSIONS: 1. Sinus rhythm upon presentation.   2.  The patient was found to have recurrent lectrical activity within the right superior and inferior pulmonary veins at baseline.  The left superior and inferior pulmonary veins were quiescent from a prior ablation procedure.   The patient underwent reisolation of the right superior and inferior pulmonary veins using radiofrequency current with a circular mapping catheter as a guide. A WACA approach was used.    3. Cavo-tricuspid isthmus ablation was performed  4. No inducible arrhythmias following ablation both on and off of dobutamine 5. No early apparent complications.  Coronary CTA 12/16/2014 IMPRESSION: 1. Coronary calcium score of 0. This was 0 percentile for age and sex matched control.  2. Normal coronary origin. Right dominance. Minimal non-obstructive CAD in the proximal RCA.  3. There is normal pulmonary vein drainage into the left atrium. There are two right and two left pulmonary veins with no evidence of stenosis.   Esophagus runs posteriorly and adjacent to the ostia of the LUPV and LLPV.  4. A large left atrial appendage has no filling defect.  5. Normal biatrial size. 6.  Hepatic steatosis   05/08/2014: TTE Study Conclusions  - Left ventricle: The cavity size was normal. Wall thickness was    normal. Systolic function was normal. The estimated ejection    fraction was in the range of 55% to 60%. Wall motion was normal;    there were no regional wall motion abnormalities.  - Aortic valve: Structurally normal valve. Transvalvular velocity    was within the normal range. There was no stenosis. There was no    regurgitation.  - Aorta: The aorta  was normal, not dilated, and non-diseased.  - Ascending aorta: The ascending aorta was normal in size.  - Mitral valve: Structurally normal valve. There was mild    regurgitation.  - Right ventricle: The cavity size was normal. Wall thickness was    normal. Systolic function was normal. Systolic pressure was    within the normal range.  - Right atrium: The atrium was normal in size.  - Atrial septum: No defect or patent foramen ovale was identified.  - Tricuspid valve: Structurally normal valve.  - Pulmonic valve: There was no regurgitation.  - Pulmonary arteries: PA peak pressure: 28 mm Hg (S).  - Inferior vena cava: The vessel was normal in size. The    respirophasic diameter changes were in the normal range (>= 50%),    consistent with normal central venous pressure.  - Pericardium, extracardiac: There was no pericardial effusion.   Impressions:  - Mild mitral and tricuspid regurgitation, otherwise normal study.    No pericardial effusion seen.    Recent Labs: No results found for requested labs within last 8760 hours.  No results found for requested labs within last 8760 hours.   CrCl cannot be calculated (Patient's most recent lab result is older than the maximum 21 days allowed.).   Wt Readings from Last 3 Encounters:  07/09/21 185 lb 9.6  oz (84.2 kg)  03/10/21 188 lb 6.4 oz (85.5 kg)  06/25/20 209 lb (94.8 kg)     Other studies reviewed: Additional studies/records reviewed today include: summarized above  ASSESSMENT AND PLAN:  Paroxysmal Afib CHA2DS2Vasc is one, not on a//c zero burden by symptoms  HTN Looks good  Disposition: F/u with EP annually, will plan to have him establish with Dr. Curt Bears next year, sooner if needed  Current medicines are reviewed at length with the patient today.  The patient did not have any concerns regarding medicines.  Venetia Night, PA-C 07/09/2021 4:39 PM     [address] [place]  [place] [address] [phone] (office)  [phone] (fax)

## 2021-07-09 ENCOUNTER — Other Ambulatory Visit: Payer: Self-pay

## 2021-07-09 ENCOUNTER — Ambulatory Visit: Payer: 59 | Admitting: Physician Assistant

## 2021-07-09 ENCOUNTER — Encounter: Payer: Self-pay | Admitting: Physician Assistant

## 2021-07-09 VITALS — BP 120/82 | HR 66 | Ht 74.0 in | Wt 185.6 lb

## 2021-07-09 DIAGNOSIS — I10 Essential (primary) hypertension: Secondary | ICD-10-CM

## 2021-07-09 DIAGNOSIS — I480 Paroxysmal atrial fibrillation: Secondary | ICD-10-CM

## 2021-07-09 NOTE — Patient Instructions (Signed)

## 2022-07-22 ENCOUNTER — Other Ambulatory Visit: Payer: Self-pay

## 2022-07-22 MED ORDER — AMLODIPINE BESYLATE 2.5 MG PO TABS: 2.5000 mg | ORAL_TABLET | Freq: Every day | ORAL | 0 refills | Status: AC | PRN

## 2022-12-31 ENCOUNTER — Encounter: Payer: Self-pay | Admitting: Cardiovascular Disease

## 2022-12-31 ENCOUNTER — Ambulatory Visit: Payer: 59 | Attending: Cardiovascular Disease | Admitting: Cardiovascular Disease

## 2022-12-31 VITALS — BP 122/78 | HR 72 | Ht 73.0 in | Wt 214.8 lb

## 2022-12-31 DIAGNOSIS — I480 Paroxysmal atrial fibrillation: Secondary | ICD-10-CM | POA: Diagnosis not present

## 2022-12-31 NOTE — Patient Instructions (Signed)
Medication Instructions:  Continue all current medications.  Labwork: none  Testing/Procedures: none  Follow-Up: 1 year   Any Other Special Instructions Will Be Listed Below (If Applicable).  If you need a refill on your cardiac medications before your next appointment, please call your pharmacy.  

## 2022-12-31 NOTE — Progress Notes (Signed)
   PCP: Tally Joe, MD Primary Cardiologist: Dr Delton See Primary EP: Dr Gunnar Bulla is a 57 y.o. male who presents today for routine electrophysiology followup.  Since last being seen in our clinic, the patient reports doing very well.    He has a history of atrial fibrillation diagnosed in 2015.  Multaq was tried but resulted in fatigue.  He had a pulmonary vein isolation in December 2015.  Sotalol was used briefly.  Second ablation with pulmonary vein isolation and CTI was performed on December 04, 2014.  Flecainide was also tried briefly.    Today, he denies symptoms of palpitations, chest pain, shortness of breath,  lower extremity edema, dizziness, presyncope, or syncope.  The patient is otherwise without complaint today.        Physical Exam: Vitals:   12/31/22 1434  BP: 122/78  Pulse: 72  SpO2: 95%  Weight: 214 lb 12.8 oz (97.4 kg)  Height: 6\' 1"  (1.854 m)    Gen: Appears comfortable, well-nourished CV: RRR, no dependent edema Pulm: breathing easily   Wt Readings from Last 3 Encounters:  12/31/22 214 lb 12.8 oz (97.4 kg)  07/09/21 185 lb 9.6 oz (84.2 kg)  03/10/21 188 lb 6.4 oz (85.5 kg)     Assessment and Plan:  1. Paroxysmal atrial fibrillation Doing well post ablation off AAD therapy chads2vasc score is 1.  Does not require OAC at this time Stop ASA  2. HTN BP controlled today Continue amlodipine 2.5, olmesartan 40 mg  Return to see EP PA in a year  Maurice Small, MD  12/31/2022 2:57 PM

## 2024-02-03 ENCOUNTER — Ambulatory Visit: Payer: 59 | Attending: Cardiovascular Disease | Admitting: Cardiovascular Disease

## 2024-02-03 ENCOUNTER — Encounter: Payer: Self-pay | Admitting: Cardiovascular Disease

## 2024-02-03 VITALS — BP 122/70 | HR 66 | Ht 73.0 in | Wt 202.2 lb

## 2024-02-03 DIAGNOSIS — I480 Paroxysmal atrial fibrillation: Secondary | ICD-10-CM | POA: Diagnosis not present

## 2024-02-03 NOTE — Patient Instructions (Signed)

## 2024-02-03 NOTE — Progress Notes (Signed)
   PCP: Seabron Lenis, MD Primary Cardiologist: Dr Maranda Primary EP: Dr Kelsie Toribio Evan [name] is a 58 y.o. male who presents today for routine electrophysiology followup.  Since last being seen in our clinic, the patient reports doing very well.    He has a history of atrial fibrillation diagnosed in 2015.  Multaq  was tried but resulted in fatigue.  He had a pulmonary vein isolation in December 2015.  Sotalol  was used briefly.  Second ablation with pulmonary vein isolation and CTI was performed on December 04, 2014.  Flecainide  was also tried briefly.    Today, he denies symptoms of palpitations, chest pain, shortness of breath,  lower extremity edema, dizziness, presyncope, or syncope.  The patient is otherwise without complaint today.        Physical Exam: Vitals:   02/03/24 1533  BP: 122/70  Pulse: 66  SpO2: 98%  Weight: 202 lb 3.2 oz (91.7 kg)  Height: 6' 1 (1.854 m)    Gen: Appears comfortable, well-nourished CV: RRR, no dependent edema Pulm: breathing easily   Wt Readings from Last 3 Encounters:  02/03/24 202 lb 3.2 oz (91.7 kg)  12/31/22 214 lb 12.8 oz (97.4 kg)  07/09/21 185 lb 9.6 oz (84.2 kg)     Assessment and Plan:  1. Paroxysmal atrial fibrillation Doing well post ablation off AAD therapy chads2vasc score is 1.  Does not require OAC at this time Stop ASA  2. HTN BP controlled today Continue amlodipine  2.5, olmesartan 40 mg  Return to see EP PA in a year  Evan FORBES Furbish, MD  02/03/2024 4:10 PM
